# Patient Record
Sex: Male | Born: 1950 | Race: White | Hispanic: No | Marital: Married | Smoking: Never smoker
Health system: Southern US, Community
[De-identification: ages and names within clinical notes are randomized; demographics above are authoritative.]

## PROBLEM LIST (undated history)

## (undated) DIAGNOSIS — K828 Other specified diseases of gallbladder: Secondary | ICD-10-CM

## (undated) DIAGNOSIS — N179 Acute kidney failure, unspecified: Secondary | ICD-10-CM

## (undated) DIAGNOSIS — T4145XA Adverse effect of unspecified anesthetic, initial encounter: Secondary | ICD-10-CM

## (undated) DIAGNOSIS — C787 Secondary malignant neoplasm of liver and intrahepatic bile duct: Secondary | ICD-10-CM

## (undated) DIAGNOSIS — R1310 Dysphagia, unspecified: Secondary | ICD-10-CM

## (undated) DIAGNOSIS — I2510 Atherosclerotic heart disease of native coronary artery without angina pectoris: Secondary | ICD-10-CM

## (undated) DIAGNOSIS — T8859XA Other complications of anesthesia, initial encounter: Secondary | ICD-10-CM

## (undated) DIAGNOSIS — C189 Malignant neoplasm of colon, unspecified: Secondary | ICD-10-CM

## (undated) HISTORY — DX: Atherosclerotic heart disease of native coronary artery without angina pectoris: I25.10

---

## 2008-12-07 DIAGNOSIS — C189 Malignant neoplasm of colon, unspecified: Secondary | ICD-10-CM

## 2008-12-07 DIAGNOSIS — C787 Secondary malignant neoplasm of liver and intrahepatic bile duct: Secondary | ICD-10-CM

## 2008-12-07 HISTORY — DX: Secondary malignant neoplasm of liver and intrahepatic bile duct: C78.7

## 2008-12-07 HISTORY — PX: COLON SURGERY: SHX602

## 2008-12-07 HISTORY — DX: Malignant neoplasm of colon, unspecified: C18.9

## 2014-12-07 DIAGNOSIS — K828 Other specified diseases of gallbladder: Secondary | ICD-10-CM

## 2014-12-07 HISTORY — DX: Other specified diseases of gallbladder: K82.8

## 2015-02-05 DIAGNOSIS — R1310 Dysphagia, unspecified: Secondary | ICD-10-CM

## 2015-02-05 DIAGNOSIS — N179 Acute kidney failure, unspecified: Secondary | ICD-10-CM

## 2015-02-05 HISTORY — DX: Dysphagia, unspecified: R13.10

## 2015-02-05 HISTORY — DX: Acute kidney failure, unspecified: N17.9

## 2015-02-16 ENCOUNTER — Emergency Department (HOSPITAL_COMMUNITY): Payer: BLUE CROSS/BLUE SHIELD

## 2015-02-16 ENCOUNTER — Encounter (HOSPITAL_COMMUNITY): Payer: Self-pay

## 2015-02-16 ENCOUNTER — Inpatient Hospital Stay (HOSPITAL_COMMUNITY)
Admission: EM | Admit: 2015-02-16 | Discharge: 2015-02-27 | DRG: 682 | Disposition: A | Discharge status: Home Health | Payer: BLUE CROSS/BLUE SHIELD | Attending: Internal Medicine | Admitting: Internal Medicine

## 2015-02-16 DIAGNOSIS — R112 Nausea with vomiting, unspecified: Secondary | ICD-10-CM | POA: Diagnosis present

## 2015-02-16 DIAGNOSIS — C787 Secondary malignant neoplasm of liver and intrahepatic bile duct: Secondary | ICD-10-CM | POA: Diagnosis present

## 2015-02-16 DIAGNOSIS — Z79899 Other long term (current) drug therapy: Secondary | ICD-10-CM

## 2015-02-16 DIAGNOSIS — E440 Moderate protein-calorie malnutrition: Secondary | ICD-10-CM | POA: Diagnosis present

## 2015-02-16 DIAGNOSIS — R54 Age-related physical debility: Secondary | ICD-10-CM | POA: Diagnosis present

## 2015-02-16 DIAGNOSIS — E872 Acidosis: Secondary | ICD-10-CM | POA: Diagnosis present

## 2015-02-16 DIAGNOSIS — J9690 Respiratory failure, unspecified, unspecified whether with hypoxia or hypercapnia: Secondary | ICD-10-CM

## 2015-02-16 DIAGNOSIS — E871 Hypo-osmolality and hyponatremia: Secondary | ICD-10-CM | POA: Diagnosis present

## 2015-02-16 DIAGNOSIS — T451X5A Adverse effect of antineoplastic and immunosuppressive drugs, initial encounter: Secondary | ICD-10-CM | POA: Diagnosis present

## 2015-02-16 DIAGNOSIS — R1110 Vomiting, unspecified: Secondary | ICD-10-CM | POA: Insufficient documentation

## 2015-02-16 DIAGNOSIS — R64 Cachexia: Secondary | ICD-10-CM | POA: Diagnosis present

## 2015-02-16 DIAGNOSIS — N179 Acute kidney failure, unspecified: Secondary | ICD-10-CM | POA: Diagnosis not present

## 2015-02-16 DIAGNOSIS — I480 Paroxysmal atrial fibrillation: Secondary | ICD-10-CM | POA: Diagnosis present

## 2015-02-16 DIAGNOSIS — C7800 Secondary malignant neoplasm of unspecified lung: Secondary | ICD-10-CM | POA: Diagnosis present

## 2015-02-16 DIAGNOSIS — D899 Disorder involving the immune mechanism, unspecified: Secondary | ICD-10-CM | POA: Diagnosis present

## 2015-02-16 DIAGNOSIS — J9601 Acute respiratory failure with hypoxia: Secondary | ICD-10-CM | POA: Diagnosis not present

## 2015-02-16 DIAGNOSIS — D696 Thrombocytopenia, unspecified: Secondary | ICD-10-CM | POA: Diagnosis present

## 2015-02-16 DIAGNOSIS — C189 Malignant neoplasm of colon, unspecified: Secondary | ICD-10-CM | POA: Diagnosis present

## 2015-02-16 DIAGNOSIS — J690 Pneumonitis due to inhalation of food and vomit: Secondary | ICD-10-CM | POA: Diagnosis not present

## 2015-02-16 DIAGNOSIS — I4891 Unspecified atrial fibrillation: Secondary | ICD-10-CM

## 2015-02-16 DIAGNOSIS — F419 Anxiety disorder, unspecified: Secondary | ICD-10-CM | POA: Diagnosis present

## 2015-02-16 DIAGNOSIS — Z9221 Personal history of antineoplastic chemotherapy: Secondary | ICD-10-CM

## 2015-02-16 DIAGNOSIS — Z6827 Body mass index (BMI) 27.0-27.9, adult: Secondary | ICD-10-CM

## 2015-02-16 DIAGNOSIS — E876 Hypokalemia: Secondary | ICD-10-CM | POA: Diagnosis present

## 2015-02-16 DIAGNOSIS — R109 Unspecified abdominal pain: Secondary | ICD-10-CM

## 2015-02-16 DIAGNOSIS — C786 Secondary malignant neoplasm of retroperitoneum and peritoneum: Secondary | ICD-10-CM | POA: Diagnosis present

## 2015-02-16 DIAGNOSIS — J9600 Acute respiratory failure, unspecified whether with hypoxia or hypercapnia: Secondary | ICD-10-CM

## 2015-02-16 DIAGNOSIS — R0602 Shortness of breath: Secondary | ICD-10-CM

## 2015-02-16 DIAGNOSIS — B9620 Unspecified Escherichia coli [E. coli] as the cause of diseases classified elsewhere: Secondary | ICD-10-CM | POA: Diagnosis present

## 2015-02-16 DIAGNOSIS — E861 Hypovolemia: Secondary | ICD-10-CM | POA: Diagnosis present

## 2015-02-16 DIAGNOSIS — R34 Anuria and oliguria: Secondary | ICD-10-CM | POA: Diagnosis not present

## 2015-02-16 DIAGNOSIS — T17908A Unspecified foreign body in respiratory tract, part unspecified causing other injury, initial encounter: Secondary | ICD-10-CM | POA: Diagnosis not present

## 2015-02-16 DIAGNOSIS — K529 Noninfective gastroenteritis and colitis, unspecified: Secondary | ICD-10-CM

## 2015-02-16 DIAGNOSIS — R1310 Dysphagia, unspecified: Secondary | ICD-10-CM

## 2015-02-16 DIAGNOSIS — D649 Anemia, unspecified: Secondary | ICD-10-CM | POA: Diagnosis present

## 2015-02-16 DIAGNOSIS — K567 Ileus, unspecified: Secondary | ICD-10-CM | POA: Diagnosis present

## 2015-02-16 DIAGNOSIS — R0902 Hypoxemia: Secondary | ICD-10-CM

## 2015-02-16 DIAGNOSIS — E878 Other disorders of electrolyte and fluid balance, not elsewhere classified: Secondary | ICD-10-CM | POA: Diagnosis present

## 2015-02-16 DIAGNOSIS — Z9114 Patient's other noncompliance with medication regimen: Secondary | ICD-10-CM | POA: Diagnosis present

## 2015-02-16 DIAGNOSIS — Z4659 Encounter for fitting and adjustment of other gastrointestinal appliance and device: Secondary | ICD-10-CM

## 2015-02-16 LAB — I-STAT CHEM 8, ED
BUN: 46 mg/dL — AB (ref 6–23)
Calcium, Ion: 1.08 mmol/L — ABNORMAL LOW (ref 1.13–1.30)
Chloride: 90 mmol/L — ABNORMAL LOW (ref 96–112)
Creatinine, Ser: 1.5 mg/dL — ABNORMAL HIGH (ref 0.50–1.35)
Glucose, Bld: 180 mg/dL — ABNORMAL HIGH (ref 70–99)
HCT: 55 % — ABNORMAL HIGH (ref 39.0–52.0)
Hemoglobin: 18.7 g/dL — ABNORMAL HIGH (ref 13.0–17.0)
Potassium: 3.1 mmol/L — ABNORMAL LOW (ref 3.5–5.1)
SODIUM: 125 mmol/L — AB (ref 135–145)
TCO2: 18 mmol/L (ref 0–100)

## 2015-02-16 LAB — URINE MICROSCOPIC-ADD ON

## 2015-02-16 LAB — COMPREHENSIVE METABOLIC PANEL
ALK PHOS: 70 U/L (ref 39–117)
ALT: 22 U/L (ref 0–53)
AST: 31 U/L (ref 0–37)
Albumin: 3.2 g/dL — ABNORMAL LOW (ref 3.5–5.2)
Anion gap: 17 — ABNORMAL HIGH (ref 5–15)
BILIRUBIN TOTAL: 1.4 mg/dL — AB (ref 0.3–1.2)
BUN: 47 mg/dL — ABNORMAL HIGH (ref 6–23)
CHLORIDE: 88 mmol/L — AB (ref 96–112)
CO2: 22 mmol/L (ref 19–32)
CREATININE: 1.64 mg/dL — AB (ref 0.50–1.35)
Calcium: 9.2 mg/dL (ref 8.4–10.5)
GFR calc Af Amer: 51 mL/min — ABNORMAL LOW (ref >90)
GFR, EST NON AFRICAN AMERICAN: 44 mL/min — AB (ref >90)
GLUCOSE: 180 mg/dL — AB (ref 70–99)
POTASSIUM: 3.3 mmol/L — AB (ref 3.5–5.1)
Sodium: 127 mmol/L — ABNORMAL LOW (ref 135–145)
Total Protein: 6.8 g/dL (ref 6.0–8.3)

## 2015-02-16 LAB — CBC WITH DIFFERENTIAL/PLATELET
BASOS ABS: 0 10*3/uL (ref 0.0–0.1)
Basophils Relative: 0 % (ref 0–1)
Eosinophils Absolute: 0 10*3/uL (ref 0.0–0.7)
Eosinophils Relative: 0 % (ref 0–5)
HCT: 47.5 % (ref 39.0–52.0)
Hemoglobin: 18.2 g/dL — ABNORMAL HIGH (ref 13.0–17.0)
LYMPHS PCT: 16 % (ref 12–46)
Lymphs Abs: 0.9 10*3/uL (ref 0.7–4.0)
MCH: 30 pg (ref 26.0–34.0)
MCHC: 38.3 g/dL — AB (ref 30.0–36.0)
MCV: 78.4 fL (ref 78.0–100.0)
Monocytes Absolute: 2.6 10*3/uL — ABNORMAL HIGH (ref 0.1–1.0)
Monocytes Relative: 46 % — ABNORMAL HIGH (ref 3–12)
NEUTROS ABS: 2.1 10*3/uL (ref 1.7–7.7)
Neutrophils Relative %: 38 % — ABNORMAL LOW (ref 43–77)
PLATELETS: 185 10*3/uL (ref 150–400)
RBC: 6.06 MIL/uL — ABNORMAL HIGH (ref 4.22–5.81)
RDW: 16.7 % — ABNORMAL HIGH (ref 11.5–15.5)
WBC: 5.6 10*3/uL (ref 4.0–10.5)

## 2015-02-16 LAB — URINALYSIS, ROUTINE W REFLEX MICROSCOPIC
Bilirubin Urine: NEGATIVE
GLUCOSE, UA: 100 mg/dL — AB
Ketones, ur: NEGATIVE mg/dL
LEUKOCYTES UA: NEGATIVE
NITRITE: NEGATIVE
PH: 6 (ref 5.0–8.0)
PROTEIN: 30 mg/dL — AB
Specific Gravity, Urine: 1.028 (ref 1.005–1.030)
UROBILINOGEN UA: 0.2 mg/dL (ref 0.0–1.0)

## 2015-02-16 LAB — MAGNESIUM: Magnesium: 2.4 mg/dL (ref 1.5–2.5)

## 2015-02-16 LAB — LACTIC ACID, PLASMA: Lactic Acid, Venous: 3.6 mmol/L (ref 0.5–2.0)

## 2015-02-16 MED ORDER — SODIUM CHLORIDE 0.9 % IJ SOLN
3.0000 mL | Freq: Two times a day (BID) | INTRAMUSCULAR | Status: DC
  Administered 2015-02-17 – 2015-02-27 (×12): 3 mL via INTRAVENOUS

## 2015-02-16 MED ORDER — ALTEPLASE 2 MG IJ SOLR
2.0000 mg | Freq: Once | INTRAMUSCULAR | Status: AC
  Administered 2015-02-16: 2 mg
  Filled 2015-02-16: qty 2

## 2015-02-16 MED ORDER — ONDANSETRON HCL 4 MG PO TABS: 4.0000 mg | ORAL_TABLET | Freq: Four times a day (QID) | ORAL | Status: DC | PRN

## 2015-02-16 MED ORDER — SODIUM CHLORIDE 0.9 % IV BOLUS (SEPSIS)
1000.0000 mL | Freq: Once | INTRAVENOUS | Status: AC
  Administered 2015-02-16: 1000 mL via INTRAVENOUS

## 2015-02-16 MED ORDER — ACETAMINOPHEN 650 MG RE SUPP: 650.0000 mg | Freq: Four times a day (QID) | RECTAL | Status: DC | PRN

## 2015-02-16 MED ORDER — POTASSIUM CHLORIDE IN NACL 40-0.9 MEQ/L-% IV SOLN
INTRAVENOUS | Status: DC
Start: 2015-02-16 — End: 2015-02-18
  Administered 2015-02-16 – 2015-02-18 (×4): 125 mL/h via INTRAVENOUS
  Filled 2015-02-16 (×9): qty 1000

## 2015-02-16 MED ORDER — HYDROCODONE-ACETAMINOPHEN 5-325 MG PO TABS
1.0000 | ORAL_TABLET | ORAL | Status: DC | PRN
  Administered 2015-02-17: 1 via ORAL
  Filled 2015-02-16: qty 1

## 2015-02-16 MED ORDER — HEPARIN SODIUM (PORCINE) 5000 UNIT/ML IJ SOLN
5000.0000 [IU] | Freq: Three times a day (TID) | INTRAMUSCULAR | Status: DC
  Administered 2015-02-16 – 2015-02-19 (×8): 5000 [IU] via SUBCUTANEOUS
  Filled 2015-02-16 (×10): qty 1

## 2015-02-16 MED ORDER — ONDANSETRON HCL 4 MG/2ML IJ SOLN
4.0000 mg | Freq: Once | INTRAMUSCULAR | Status: AC
Start: 2015-02-16 — End: 2015-02-16
  Administered 2015-02-16: 4 mg via INTRAVENOUS
  Filled 2015-02-16: qty 2

## 2015-02-16 MED ORDER — PROCHLORPERAZINE MALEATE 10 MG PO TABS
10.0000 mg | ORAL_TABLET | Freq: Four times a day (QID) | ORAL | Status: DC | PRN
  Administered 2015-02-17: 10 mg via ORAL
  Filled 2015-02-16 (×2): qty 1

## 2015-02-16 MED ORDER — PHENOL 1.4 % MT LIQD
1.0000 | OROMUCOSAL | Status: DC | PRN
  Filled 2015-02-16: qty 177

## 2015-02-16 MED ORDER — POTASSIUM CHLORIDE 10 MEQ/100ML IV SOLN
10.0000 meq | INTRAVENOUS | Status: AC
  Administered 2015-02-16 (×4): 10 meq via INTRAVENOUS
  Filled 2015-02-16 (×4): qty 100

## 2015-02-16 MED ORDER — HYDROMORPHONE HCL 1 MG/ML IJ SOLN
0.5000 mg | INTRAMUSCULAR | Status: DC | PRN
  Administered 2015-02-16 – 2015-02-17 (×3): 0.5 mg via INTRAVENOUS
  Filled 2015-02-16 (×4): qty 1

## 2015-02-16 MED ORDER — PROMETHAZINE HCL 25 MG/ML IJ SOLN
25.0000 mg | Freq: Once | INTRAMUSCULAR | Status: AC
  Administered 2015-02-16: 25 mg via INTRAVENOUS
  Filled 2015-02-16: qty 1

## 2015-02-16 MED ORDER — ONDANSETRON HCL 4 MG/2ML IJ SOLN
4.0000 mg | Freq: Four times a day (QID) | INTRAMUSCULAR | Status: DC | PRN
  Administered 2015-02-16 – 2015-02-18 (×6): 4 mg via INTRAVENOUS
  Filled 2015-02-16 (×6): qty 2

## 2015-02-16 MED ORDER — SODIUM CHLORIDE 0.9 % IJ SOLN
10.0000 mL | INTRAMUSCULAR | Status: DC | PRN
  Administered 2015-02-20: 10 mL
  Administered 2015-02-22: 20 mL
  Administered 2015-02-23: 10 mL
  Administered 2015-02-23: 20 mL
  Administered 2015-02-23 – 2015-02-24 (×3): 10 mL
  Administered 2015-02-25: 20 mL
  Filled 2015-02-16 (×8): qty 40

## 2015-02-16 MED ORDER — ALUM & MAG HYDROXIDE-SIMETH 200-200-20 MG/5ML PO SUSP
30.0000 mL | Freq: Four times a day (QID) | ORAL | Status: DC | PRN
  Filled 2015-02-16: qty 30

## 2015-02-16 MED ORDER — HYDROMORPHONE HCL 1 MG/ML IJ SOLN
1.0000 mg | INTRAMUSCULAR | Status: DC | PRN
  Administered 2015-02-16: 1 mg via INTRAVENOUS
  Filled 2015-02-16: qty 1

## 2015-02-16 MED ORDER — ACETAMINOPHEN 325 MG PO TABS: 650.0000 mg | ORAL_TABLET | Freq: Four times a day (QID) | ORAL | Status: DC | PRN

## 2015-02-16 NOTE — ED Provider Notes (Signed)
CSN: [id number]     Arrival date & time 02/16/15  0754 History   First MD Initiated Contact with Patient 02/16/15 0813     No chief complaint on file.    (Consider location/radiation/quality/duration/timing/severity/associated sxs/prior Treatment) HPI Comments: Pt has hx of colon CA with meds to abdomen - on chemo IV and PO - just finished second round of oral chemo 4 days ago - has been having diarrhea, persistent n/v and not improving with meds at home.  Sx are gradually worsening, nothing makes better or worse.  Denies fevers.  Had dark stools 2 days ago.   Gets care at [facility] records reviewed, right hemicolectomy with primary anastomosis was the surgery that was performed in December 2015.  The history is provided by the patient.    Past Medical History  Diagnosis Date  . Cancer    Past Surgical History  Procedure Laterality Date  . Colon surgery     History reviewed. No pertinent family history. History  Substance Use Topics  . Smoking status: Never Smoker   . Smokeless tobacco: Not on file  . Alcohol Use: Yes     Comment: Social drinker    Review of Systems  All other systems reviewed and are negative.     Allergies  Review of patient's allergies indicates no known allergies.  Home Medications   Prior to Admission medications   Medication Sig Start Date End Date Taking? Authorizing Provider  capecitabine (XELODA) 500 MG tablet Take 1,500 mg by mouth See admin instructions. Take 3 tab (1500 mg) in AM and 3 tab (1500 mg) in PM (12 hours apart) by mouth with water AFTER meal days 1-14. OFF days 15-21 12/26/14  Yes Historical Provider, MD  Cetirizine HCl 10 MG CAPS Take 10 mg by mouth daily.   Yes Historical Provider, MD  ondansetron (ZOFRAN) 8 MG tablet Take by mouth every 8 (eight) hours as needed for nausea or vomiting.   Yes Historical Provider, MD  prochlorperazine (COMPAZINE) 10 MG tablet Take 10 mg by mouth every 6 (six) hours as needed for nausea or  vomiting.   Yes Historical Provider, MD  rosuvastatin (CRESTOR) 10 MG tablet Take 10 mg by mouth daily. 09/08/14  Yes Historical Provider, MD  senna-docusate (SENOKOT-S) 8.6-50 MG per tablet Take 1 tablet by mouth daily as needed. 01/24/15 01/24/16 Yes Historical Provider, MD   BP 129/88 mmHg  Pulse 115  Temp(Src) 97.5 F (36.4 C) (Oral)  Resp 18  Ht 5\' 8"  (1.727 m)  Wt 160 lb (72.576 kg)  BMI 24.33 kg/m2  SpO2 93% Physical Exam  Constitutional: He appears well-developed and well-nourished.  HENT:  Head: Normocephalic and atraumatic.  Mouth/Throat: Oropharynx is clear and moist. No oropharyngeal exudate.  Dry MM  Eyes: Conjunctivae and EOM are normal. Pupils are equal, round, and reactive to light. Right eye exhibits no discharge. Left eye exhibits no discharge. No scleral icterus.  Neck: Normal range of motion. Neck supple. No JVD present. No thyromegaly present.  Cardiovascular: Regular rhythm, normal heart sounds and intact distal pulses.  Exam reveals no gallop and no friction rub.   No murmur heard. Tachycardia   Pulmonary/Chest: Effort normal and breath sounds normal. No respiratory distress. He has no wheezes. He has no rales.  Abdominal: Soft. Bowel sounds are normal. He exhibits no distension and no mass. There is tenderness ( Laparotomy scar well-healed, tenderness in the right side of the abdomen with mild guarding, no distention, no tympanitic sounds to  percussion).  Musculoskeletal: Normal range of motion. He exhibits no edema or tenderness.  Lymphadenopathy:    He has no cervical adenopathy.  Neurological: He is alert. Coordination normal.  Skin: Skin is warm and dry. No rash noted. No erythema.  Psychiatric: He has a normal mood and affect. His behavior is normal.  Nursing note and vitals reviewed.   ED Course  Procedures (including critical care time) Labs Review Labs Reviewed  COMPREHENSIVE METABOLIC PANEL - Abnormal; Notable for the following:    Sodium 127 (*)     Potassium 3.3 (*)    Chloride 88 (*)    Glucose, Bld 180 (*)    BUN 47 (*)    Creatinine, Ser 1.64 (*)    Albumin 3.2 (*)    Total Bilirubin 1.4 (*)    GFR calc non Af Amer 44 (*)    GFR calc Af Amer 51 (*)    Anion gap 17 (*)    All other components within normal limits  CBC WITH DIFFERENTIAL/PLATELET - Abnormal; Notable for the following:    RBC 6.06 (*)    Hemoglobin 18.2 (*)    MCHC 38.3 (*)    RDW 16.7 (*)    Neutrophils Relative % 38 (*)    Monocytes Relative 46 (*)    Monocytes Absolute 2.6 (*)    All other components within normal limits  I-STAT CHEM 8, ED - Abnormal; Notable for the following:    Sodium 125 (*)    Potassium 3.1 (*)    Chloride 90 (*)    BUN 46 (*)    Creatinine, Ser 1.50 (*)    Glucose, Bld 180 (*)    Calcium, Ion 1.08 (*)    Hemoglobin 18.7 (*)    HCT 55.0 (*)    All other components within normal limits  URINALYSIS, ROUTINE W REFLEX MICROSCOPIC  POC OCCULT BLOOD, ED    Imaging Review US Abdomen Complete  02/16/2015   CLINICAL DATA:  Abdominal pain, colon carcinoma, nausea and vomiting  EXAM: COMPLETE ABDOMINAL ULTRASOUND  COMPARISON:  None available  FINDINGS: Gallbladder: Physiologically distended without stones, wall thickening, or pericholecystic fluid. Non shadowing mobile tumefactive sludge. Sonographer reports no sonographic Murphy's sign.  Common bile duct:  Normal in caliber, 4.38mm diameter.  Liver: There are 2 echogenic lesions in the right lobe probably in segment 5, 28 x 14 x 18 mm and 16 x 13 x 16 mm. There is no intrahepatic biliary ductal dilatation. Background parenchyma is unremarkable.  IVC:  Negative  Pancreas: Visualized segments unremarkable, portions obscured by overlying bowel gas.  Spleen:  No focal lesion, craniocaudal 7.5cm in length.  Right Kidney: 18 x 16 x 15 mm cyst in the upper pole. No hydronephrosis. 9.7Cm in length.  Left Kidney:  No lesion or hydronephrosis, 11.2cm in length.  Abdominal aorta:  Negative   IMPRESSION: 1. Two focal liver lesions suggesting metastatic disease given the clinical history. 2. Mobile tumefactive sludge in the gallbladder without ultrasound evidence of cholecystitis. 3. Right renal cyst   Electronically Signed   By: Lucrezia Europe M.D.   On: 02/16/2015 12:03   Dg Abd Acute W/chest  02/16/2015   CLINICAL DATA:  Dehydration, decreased appetite, nausea, vomiting and diarrhea for 4 days, cancer patient with port for chemotherapy  EXAM: ACUTE ABDOMEN SERIES (ABDOMEN 2 VIEW & CHEST 1 VIEW)  COMPARISON:  None  FINDINGS: RIGHT jugular Port-A-Cath tip projecting over SVC near cavoatrial junction.  Normal heart size, mediastinal contours and  pulmonary vascularity.  LEFT hilar versus perihilar mass 3.0 x 2.7 cm.  Lungs otherwise clear.  No infiltrate, pleural effusion or pneumothorax.  Prior bowel resection RIGHT upper quadrant question RIGHT hemicolectomy.  Normal bowel gas pattern.  No bowel dilatation, bowel wall thickening, or free intraperitoneal air.  LEFT pelvic phleboliths.  No definite urinary tract calcification or acute osseous findings.  IMPRESSION: 3.0 x 2.7 cm LEFT hilar versus perihilar mass question neoplasm; recommend correlation with any prior outside imaging patient has.  If chest has not been imaged previously, recommend assessment by CT chest with IV contrast to exclude primary or metastatic neoplasm as well as hilar adenopathy.  Question prior RIGHT colon resection.  No acute abdominal findings.   Electronically Signed   By: Lavonia Dana M.D.   On: 02/16/2015 10:22      MDM   Final diagnoses:  SOB (shortness of breath)  Vomiting  Abdominal pain  Hyponatremia  Oliguria  AKI (acute kidney injury)    Ill appearing, tachycardic and dehydrated - has not been taking meds as prescribed to prophylactically treat his nausea.  The patient will need acute abdominal series, labs, hydration, anticipate admission. Possibly has a bowel obstruction secondary to recent  surgery  Review of the medical record shows that the patient had had CT scan and ultrasound imaging approximately 4 weeks ago at [facility], these images were reviewed, CT scan showed no acute findings, ultrasound showed findings consistent with cholecystitis, the patient did not have surgery at that time thought to be contraindicated secondary to patient's cancer status on chemotherapy.  BUN:Cr ratio is >30, so this is likely related to dehydration - though chemo could be causing the renal failure as well.  Hydration given.  Discussed with hospitalist, he will be admitted to that service, he is persistently mildly tachycardic, no coughing, no fever, blood counts reassuring, no signs of neutropenia. IV fluids and supportive antiemetics have been given. Hyponatremia diagnosed, again supportive care, acute kidney injury, again supportive care.  Meds given in ED:  Medications  sodium chloride 0.9 % injection 10-40 mL (not administered)  sodium chloride 0.9 % bolus 1,000 mL (0 mLs Intravenous Stopped 02/16/15 1145)  ondansetron (ZOFRAN) injection 4 mg (4 mg Intravenous Given 02/16/15 1054)  promethazine (PHENERGAN) injection 25 mg (25 mg Intravenous Given 02/16/15 1051)  alteplase (CATHFLO ACTIVASE) injection 2 mg (2 mg Intracatheter Given 02/16/15 1319)      Noemi Chapel, MD 02/16/15 1337

## 2015-02-16 NOTE — ED Notes (Signed)
Pt is cancer pt and reports that he is currently on chemo along with oral chemo.  Pt reports that since Wednesday he has been unable to keep any food or fluids down and just feels drained and tired.  Pt has a prescription for anti-nausea medications but reports no relief.  Pt has porta cath in right upper chest.

## 2015-02-16 NOTE — ED Notes (Signed)
Pt asking for water.  Pt advised not at this time d/t chief complaint of vomiting.

## 2015-02-16 NOTE — H&P (Signed)
[facility] History and Physical                                                                                    Benjamin Blake, is a 64 y.o. male  MRN: [id number]   DOB - 07/20/1951  Admit Date - 02/16/2015  Outpatient Primary MD for the patient is Default, Provider, MD  With History of -  Past Medical History  Diagnosis Date  . Cancer       Past Surgical History  Procedure Laterality Date  . Colon surgery      in for   No chief complaint on file.    HPI  Benjamin Blake  is a 64 y.o. male, who just moved to the area from [place]. He has metastatic colon cancer and presents to our ER for intractable vomiting and diarrhea. His colon cancer was originally diagnosed in 2010. He underwent surgery at that time. Unfortunately they found a recurrence in 2015 with metastases to the liver, peritoneum, and lung. He is undergoing chemotherapy at [facility] His oncologist is Dr. Reynaldo Minium. His last chemotherapy treatment was 01/31/15. He was feeling fairly well until Thursday 3/10. After dinner on Thursday he began having vomiting and diarrhea. His wife called the oncologist who recommended pushing fluids including Pedialyte. They attempted to do this, but he became progressively weaker and kept vomiting. Consequently they came to the emergency department. He has seen some black emesis yesterday but has continued to vomit since and has seen no more hematemesis.  In the ER he appears dehydrated. He is hemoconcentrated with a hemoglobin of 18.7 and hematocrit of 55. His creatinine is elevated at 1.64. Baseline is 1.0. His potassium chloride and sodium are low.  Review of Systems   In addition to the HPI above,  No Fever-chills, No Headache, No changes with Vision or hearing, No problems swallowing food or Liquids, No Chest pain, Cough or Shortness of Breath, No Abdominal pain, No Nausea or Vomiting, Bowel movements are regular, No Blood in stool or Urine, No dysuria, No new skin rashes  or bruises, No new joints pains-aches,  No new weakness, tingling, numbness in any extremity, No recent weight gain or loss, A full 10 point Review of Systems was done, except as stated above, all other Review of Systems were negative.  Social History History  Substance Use Topics  . Smoking status: Never Smoker   . Smokeless tobacco: Not on file  . Alcohol Use: Yes     Comment: Social drinker    Family History History reviewed. No pertinent family history.  Prior to Admission medications   Medication Sig Start Date End Date Taking? Authorizing Provider  capecitabine (XELODA) 500 MG tablet Take 1,500 mg by mouth See admin instructions. Take 3 tab (1500 mg) in AM and 3 tab (1500 mg) in PM (12 hours apart) by mouth with water AFTER meal days 1-14. OFF days 15-21 12/26/14  Yes Historical Provider, MD  Cetirizine HCl 10 MG CAPS Take 10 mg by mouth daily.   Yes Historical Provider, MD  ondansetron (ZOFRAN) 8 MG tablet Take by mouth every 8 (eight) hours as needed for nausea or vomiting.  Yes Historical Provider, MD  prochlorperazine (COMPAZINE) 10 MG tablet Take 10 mg by mouth every 6 (six) hours as needed for nausea or vomiting.   Yes Historical Provider, MD  rosuvastatin (CRESTOR) 10 MG tablet Take 10 mg by mouth daily. 09/08/14  Yes Historical Provider, MD  senna-docusate (SENOKOT-S) 8.6-50 MG per tablet Take 1 tablet by mouth daily as needed. 01/24/15 01/24/16 Yes Historical Provider, MD    No Known Allergies  Physical Exam  Vitals  Blood pressure 135/81, pulse 103, temperature 97.5 F (36.4 C), temperature source Oral, resp. rate 18, height 5' 8"  (1.727 m), weight 72.576 kg (160 lb), SpO2 95 %.   General:  Wd caucasian male, lying in bed in NAD, wife at bedside.  Psych:  Normal affect and insight, Not Suicidal or Homicidal, Awake Alert, Oriented X 3.  Neuro:   No F.N deficits, ALL C.Nerves Intact, Strength 5/5 all 4 extremities, Sensation intact all 4 extremities.  ENT:  Ears  and Eyes appear Normal, Conjunctivae clear, PER. Moist oral mucosa without erythema or exudates.  Neck:  Supple, No lymphadenopathy appreciated  Respiratory:  Symmetrical chest wall movement, Good air movement bilaterally, CTAB.  Port a cath in right chest.   Cardiac:  Tachy with No Murmurs, no LE edema noted, no JVD.    Abdomen:  Positive bowel sounds, Soft, mild tenderness in the periumbilical region, Non distended,  No masses appreciated  Skin:  No Cyanosis, Normal Skin Turgor, No Skin Rash or Bruise.  Extremities:  Able to move all 4. 5/5 strength in each,  no effusions.  Data Review  CBC  Recent Labs Lab 02/16/15 1040 02/16/15 1051  WBC 5.6  --   HGB 18.2* 18.7*  HCT 47.5 55.0*  PLT 185  --   MCV 78.4  --   MCH 30.0  --   MCHC 38.3*  --   RDW 16.7*  --   LYMPHSABS 0.9  --   MONOABS 2.6*  --   EOSABS 0.0  --   BASOSABS 0.0  --     Chemistries   Recent Labs Lab 02/16/15 1040 02/16/15 1051  NA 127* 125*  K 3.3* 3.1*  CL 88* 90*  CO2 22  --   GLUCOSE 180* 180*  BUN 47* 46*  CREATININE 1.64* 1.50*  CALCIUM 9.2  --   AST 31  --   ALT 22  --   ALKPHOS 70  --   BILITOT 1.4*  --      Imaging results:   US Abdomen Complete  02/16/2015   CLINICAL DATA:  Abdominal pain, colon carcinoma, nausea and vomiting  EXAM: COMPLETE ABDOMINAL ULTRASOUND  COMPARISON:  None available  FINDINGS: Gallbladder: Physiologically distended without stones, wall thickening, or pericholecystic fluid. Non shadowing mobile tumefactive sludge. Sonographer reports no sonographic Murphy's sign.  Common bile duct:  Normal in caliber, 4.70m diameter.  Liver: There are 2 echogenic lesions in the right lobe probably in segment 5, 28 x 14 x 18 mm and 16 x 13 x 16 mm. There is no intrahepatic biliary ductal dilatation. Background parenchyma is unremarkable.  IVC:  Negative  Pancreas: Visualized segments unremarkable, portions obscured by overlying bowel gas.  Spleen:  No focal lesion, craniocaudal  7.5cm in length.  Right Kidney: 18 x 16 x 15 mm cyst in the upper pole. No hydronephrosis. 9.7Cm in length.  Left Kidney:  No lesion or hydronephrosis, 11.2cm in length.  Abdominal aorta:  Negative  IMPRESSION: 1. Two focal liver lesions suggesting  metastatic disease given the clinical history. 2. Mobile tumefactive sludge in the gallbladder without ultrasound evidence of cholecystitis. 3. Right renal cyst   Electronically Signed   By: Lucrezia Europe M.D.   On: 02/16/2015 12:03   Dg Abd Acute W/chest  02/16/2015   CLINICAL DATA:  Dehydration, decreased appetite, nausea, vomiting and diarrhea for 4 days, cancer patient with port for chemotherapy  EXAM: ACUTE ABDOMEN SERIES (ABDOMEN 2 VIEW & CHEST 1 VIEW)  COMPARISON:  None  FINDINGS: RIGHT jugular Port-A-Cath tip projecting over SVC near cavoatrial junction.  Normal heart size, mediastinal contours and pulmonary vascularity.  LEFT hilar versus perihilar mass 3.0 x 2.7 cm.  Lungs otherwise clear.  No infiltrate, pleural effusion or pneumothorax.  Prior bowel resection RIGHT upper quadrant question RIGHT hemicolectomy.  Normal bowel gas pattern.  No bowel dilatation, bowel wall thickening, or free intraperitoneal air.  LEFT pelvic phleboliths.  No definite urinary tract calcification or acute osseous findings.  IMPRESSION: 3.0 x 2.7 cm LEFT hilar versus perihilar mass question neoplasm; recommend correlation with any prior outside imaging patient has.  If chest has not been imaged previously, recommend assessment by CT chest with IV contrast to exclude primary or metastatic neoplasm as well as hilar adenopathy.  Question prior RIGHT colon resection.  No acute abdominal findings.   Electronically Signed   By: Lavonia Dana M.D.   On: 02/16/2015 10:22     Assessment & Plan  Principal Problem:   Chemotherapy-induced vomiting Active Problems:   AKI (acute kidney injury)   Hypokalemia   Hyponatremia   Chemo Therapy Induced Vomiting. Patient last had chemo on  2/25 at [facility].  Started vomiting and having diarrhea Thursday night (3/10) after eating a low fat dinner. Will check for C-diff.  Lactic acid pending. Has had recent gall bladder evaluation for sludge.  Surgery at [facility] recommended Surveillance.   IV hydration.  Replenish potassium, check magnesium.  Acute Kidney Injury Baseline creatinine of 1.0 at [facility] on 2/25.   Secondary to vomiting and diarrhea.  U/A pending. Expect this will improve with IV hydration.    Elevated LFTs. LFTs at [facility] on 2/25 were AST:  29, ALT:  43,  Alk Phos:  84, Tbili 0.8. Today on admission:  AST:  31,  ALT:  22, Alk Phos:  70, Tbili 1.4 U/S 3/12 appears negative for cholecystitis.  No CBD stone.  Sludge in GB.  + liver lesions. The elevation is likely due to liver mets.  Hyponatremia / Hypokalemia / Hypochloremia Secondary to dehydration.  Will hydrate and check labs in am.  DVT Prophylaxis: heparin  AM Labs Ordered, also please review Full Orders  Family Communication:   Wife at bedside.  Code Status:  full  Condition:  Guarded but stable.  Time spent in minutes : [id number]  PA-C on 02/16/2015 at 2:26 PM  Between 7am to 7pm - Pager - [phone]  After 7pm go to [email or web address] - password [email or web address]  And look for the night coverage person covering me after hours  [facility]

## 2015-02-16 NOTE — ED Notes (Signed)
Pt knows that urine is needed. Pt is getting IV fluids at this time.

## 2015-02-16 NOTE — ED Notes (Signed)
Pt knows that urine is needed. Pt given a urinal pt knows that urine is needed. Pt stated that he would try to void.

## 2015-02-17 ENCOUNTER — Inpatient Hospital Stay (HOSPITAL_COMMUNITY): Payer: BLUE CROSS/BLUE SHIELD

## 2015-02-17 ENCOUNTER — Observation Stay (HOSPITAL_COMMUNITY): Payer: BLUE CROSS/BLUE SHIELD

## 2015-02-17 DIAGNOSIS — Z9114 Patient's other noncompliance with medication regimen: Secondary | ICD-10-CM | POA: Diagnosis present

## 2015-02-17 DIAGNOSIS — D899 Disorder involving the immune mechanism, unspecified: Secondary | ICD-10-CM | POA: Diagnosis present

## 2015-02-17 DIAGNOSIS — B9620 Unspecified Escherichia coli [E. coli] as the cause of diseases classified elsewhere: Secondary | ICD-10-CM | POA: Diagnosis present

## 2015-02-17 DIAGNOSIS — Z9221 Personal history of antineoplastic chemotherapy: Secondary | ICD-10-CM | POA: Diagnosis not present

## 2015-02-17 DIAGNOSIS — N179 Acute kidney failure, unspecified: Secondary | ICD-10-CM | POA: Diagnosis present

## 2015-02-17 DIAGNOSIS — T17998S Other foreign object in respiratory tract, part unspecified causing other injury, sequela: Secondary | ICD-10-CM | POA: Diagnosis not present

## 2015-02-17 DIAGNOSIS — J9601 Acute respiratory failure with hypoxia: Secondary | ICD-10-CM | POA: Diagnosis not present

## 2015-02-17 DIAGNOSIS — C7800 Secondary malignant neoplasm of unspecified lung: Secondary | ICD-10-CM | POA: Diagnosis present

## 2015-02-17 DIAGNOSIS — R54 Age-related physical debility: Secondary | ICD-10-CM | POA: Diagnosis present

## 2015-02-17 DIAGNOSIS — R64 Cachexia: Secondary | ICD-10-CM | POA: Diagnosis present

## 2015-02-17 DIAGNOSIS — J690 Pneumonitis due to inhalation of food and vomit: Secondary | ICD-10-CM | POA: Diagnosis not present

## 2015-02-17 DIAGNOSIS — R112 Nausea with vomiting, unspecified: Secondary | ICD-10-CM | POA: Diagnosis present

## 2015-02-17 DIAGNOSIS — D649 Anemia, unspecified: Secondary | ICD-10-CM | POA: Diagnosis present

## 2015-02-17 DIAGNOSIS — Z79899 Other long term (current) drug therapy: Secondary | ICD-10-CM | POA: Diagnosis not present

## 2015-02-17 DIAGNOSIS — D696 Thrombocytopenia, unspecified: Secondary | ICD-10-CM | POA: Diagnosis present

## 2015-02-17 DIAGNOSIS — E861 Hypovolemia: Secondary | ICD-10-CM | POA: Diagnosis present

## 2015-02-17 DIAGNOSIS — I480 Paroxysmal atrial fibrillation: Secondary | ICD-10-CM | POA: Diagnosis present

## 2015-02-17 DIAGNOSIS — E878 Other disorders of electrolyte and fluid balance, not elsewhere classified: Secondary | ICD-10-CM | POA: Diagnosis present

## 2015-02-17 DIAGNOSIS — K567 Ileus, unspecified: Secondary | ICD-10-CM | POA: Diagnosis present

## 2015-02-17 DIAGNOSIS — I4891 Unspecified atrial fibrillation: Secondary | ICD-10-CM | POA: Diagnosis not present

## 2015-02-17 DIAGNOSIS — T17998D Other foreign object in respiratory tract, part unspecified causing other injury, subsequent encounter: Secondary | ICD-10-CM | POA: Diagnosis not present

## 2015-02-17 DIAGNOSIS — T451X5A Adverse effect of antineoplastic and immunosuppressive drugs, initial encounter: Secondary | ICD-10-CM | POA: Diagnosis present

## 2015-02-17 DIAGNOSIS — J9600 Acute respiratory failure, unspecified whether with hypoxia or hypercapnia: Secondary | ICD-10-CM | POA: Diagnosis not present

## 2015-02-17 DIAGNOSIS — E876 Hypokalemia: Secondary | ICD-10-CM | POA: Diagnosis present

## 2015-02-17 DIAGNOSIS — E871 Hypo-osmolality and hyponatremia: Secondary | ICD-10-CM | POA: Diagnosis present

## 2015-02-17 DIAGNOSIS — C786 Secondary malignant neoplasm of retroperitoneum and peritoneum: Secondary | ICD-10-CM | POA: Diagnosis present

## 2015-02-17 DIAGNOSIS — C189 Malignant neoplasm of colon, unspecified: Secondary | ICD-10-CM | POA: Diagnosis present

## 2015-02-17 DIAGNOSIS — C787 Secondary malignant neoplasm of liver and intrahepatic bile duct: Secondary | ICD-10-CM | POA: Diagnosis present

## 2015-02-17 DIAGNOSIS — Z6827 Body mass index (BMI) 27.0-27.9, adult: Secondary | ICD-10-CM | POA: Diagnosis not present

## 2015-02-17 DIAGNOSIS — E46 Unspecified protein-calorie malnutrition: Secondary | ICD-10-CM | POA: Diagnosis not present

## 2015-02-17 DIAGNOSIS — E872 Acidosis: Secondary | ICD-10-CM | POA: Diagnosis present

## 2015-02-17 DIAGNOSIS — R1110 Vomiting, unspecified: Secondary | ICD-10-CM

## 2015-02-17 DIAGNOSIS — F419 Anxiety disorder, unspecified: Secondary | ICD-10-CM | POA: Diagnosis present

## 2015-02-17 DIAGNOSIS — E440 Moderate protein-calorie malnutrition: Secondary | ICD-10-CM | POA: Diagnosis present

## 2015-02-17 DIAGNOSIS — R34 Anuria and oliguria: Secondary | ICD-10-CM | POA: Diagnosis present

## 2015-02-17 LAB — BLOOD GAS, ARTERIAL
Acid-base deficit: 6.5 mmol/L — ABNORMAL HIGH (ref 0.0–2.0)
Bicarbonate: 17.3 mEq/L — ABNORMAL LOW (ref 20.0–24.0)
Drawn by: 252031
O2 CONTENT: 6 L/min
O2 SAT: 91.3 %
PATIENT TEMPERATURE: 98.6
PCO2 ART: 28.5 mmHg — AB (ref 35.0–45.0)
TCO2: 18.2 mmol/L (ref 0–100)
pH, Arterial: 7.402 (ref 7.350–7.450)
pO2, Arterial: 63.8 mmHg — ABNORMAL LOW (ref 80.0–100.0)

## 2015-02-17 LAB — CBC
HCT: 44.1 % (ref 39.0–52.0)
HCT: 41.4 % (ref 39.0–52.0)
Hemoglobin: 16 g/dL (ref 13.0–17.0)
Hemoglobin: 15 g/dL (ref 13.0–17.0)
MCH: 29 pg (ref 26.0–34.0)
MCH: 29.2 pg (ref 26.0–34.0)
MCHC: 36.3 g/dL — AB (ref 30.0–36.0)
MCHC: 36.2 g/dL — ABNORMAL HIGH (ref 30.0–36.0)
MCV: 80 fL (ref 78.0–100.0)
MCV: 80.7 fL (ref 78.0–100.0)
PLATELETS: 158 10*3/uL (ref 150–400)
PLATELETS: 153 10*3/uL (ref 150–400)
RBC: 5.51 MIL/uL (ref 4.22–5.81)
RBC: 5.13 MIL/uL (ref 4.22–5.81)
RDW: 16.8 % — ABNORMAL HIGH (ref 11.5–15.5)
RDW: 17.1 % — AB (ref 11.5–15.5)
WBC: 6.1 10*3/uL (ref 4.0–10.5)
WBC: 7.6 10*3/uL (ref 4.0–10.5)

## 2015-02-17 LAB — BASIC METABOLIC PANEL
ANION GAP: 13 (ref 5–15)
Anion gap: 11 (ref 5–15)
BUN: 23 mg/dL (ref 6–23)
BUN: 31 mg/dL — AB (ref 6–23)
CALCIUM: 8.2 mg/dL — AB (ref 8.4–10.5)
CHLORIDE: 95 mmol/L — AB (ref 96–112)
CO2: 21 mmol/L (ref 19–32)
CO2: 21 mmol/L (ref 19–32)
CREATININE: 1.27 mg/dL (ref 0.50–1.35)
Calcium: 8.2 mg/dL — ABNORMAL LOW (ref 8.4–10.5)
Chloride: 94 mmol/L — ABNORMAL LOW (ref 96–112)
Creatinine, Ser: 1 mg/dL (ref 0.50–1.35)
GFR calc Af Amer: 69 mL/min — ABNORMAL LOW (ref >90)
GFR calc non Af Amer: 79 mL/min — ABNORMAL LOW (ref >90)
GFR, EST NON AFRICAN AMERICAN: 59 mL/min — AB (ref >90)
GLUCOSE: 148 mg/dL — AB (ref 70–99)
GLUCOSE: 108 mg/dL — AB (ref 70–99)
POTASSIUM: 4.1 mmol/L (ref 3.5–5.1)
POTASSIUM: 5 mmol/L (ref 3.5–5.1)
Sodium: 127 mmol/L — ABNORMAL LOW (ref 135–145)
Sodium: 128 mmol/L — ABNORMAL LOW (ref 135–145)

## 2015-02-17 LAB — LACTIC ACID, PLASMA: LACTIC ACID, VENOUS: 4.7 mmol/L — AB (ref 0.5–2.0)

## 2015-02-17 LAB — BRAIN NATRIURETIC PEPTIDE: B NATRIURETIC PEPTIDE 5: 59.4 pg/mL (ref 0.0–100.0)

## 2015-02-17 LAB — CLOSTRIDIUM DIFFICILE BY PCR: CDIFFPCR: NEGATIVE

## 2015-02-17 MED ORDER — HYDROMORPHONE HCL 1 MG/ML IJ SOLN
1.0000 mg | INTRAMUSCULAR | Status: DC | PRN
  Administered 2015-02-17 – 2015-02-18 (×9): 1 mg via INTRAVENOUS
  Filled 2015-02-17 (×8): qty 1

## 2015-02-17 MED ORDER — HYDROMORPHONE HCL 1 MG/ML IJ SOLN
1.0000 mg | Freq: Once | INTRAMUSCULAR | Status: AC
  Administered 2015-02-17: 1 mg via INTRAVENOUS

## 2015-02-17 MED ORDER — METOPROLOL TARTRATE 1 MG/ML IV SOLN: 5.0000 mg | Freq: Once | INTRAVENOUS | Status: DC

## 2015-02-17 MED ORDER — BACLOFEN 5 MG HALF TABLET
5.0000 mg | ORAL_TABLET | Freq: Three times a day (TID) | ORAL | Status: DC | PRN
  Administered 2015-02-17 (×2): 5 mg via ORAL
  Filled 2015-02-17 (×4): qty 1

## 2015-02-17 MED ORDER — WHITE PETROLATUM GEL
Status: AC
  Administered 2015-02-17: 13:00:00
  Filled 2015-02-17: qty 1

## 2015-02-17 MED ORDER — HYDROMORPHONE HCL 1 MG/ML IJ SOLN
INTRAMUSCULAR | Status: AC
  Filled 2015-02-17: qty 1

## 2015-02-17 MED ORDER — METOPROLOL TARTRATE 1 MG/ML IV SOLN
2.5000 mg | Freq: Once | INTRAVENOUS | Status: AC
  Administered 2015-02-17: 2.5 mg via INTRAVENOUS
  Filled 2015-02-17: qty 5

## 2015-02-17 NOTE — Progress Notes (Signed)
UR completed 

## 2015-02-17 NOTE — Evaluation (Signed)
Physical Therapy Evaluation Patient Details Name: Benjamin Blake MRN: [id number] DOB: 24-Apr-1951 Today's Date: 02/17/2015   History of Present Illness  Pt is a 64 y.o. male, who just moved to the area from [place]. He has metastatic colon cancer and presents to our ER for intractable vomiting and diarrhea. His colon cancer was originally diagnosed in 2010. Unfortunately they found a recurrence in 2015 with metastases to the liver, peritoneum, and lung. He is undergoing chemotherapy at [facility]. His oncologist is Dr. Reynaldo Minium. His last chemotherapy treatment was 01/31/15. He was feeling fairly well until Thursday 3/10. After dinner on Thursday he began having vomiting and diarrhea. His wife called the oncologist who recommended pushing fluids including Pedialyte. They attempted to do this, but he became progressively weaker and kept vomiting. Consequently they came to the emergency department. Pt was admitted for further evaluation.  Clinical Impression  Pt admitted with above diagnosis. Pt currently with functional limitations due to the deficits listed below (see PT Problem List). At the time of PT eval pt was able to perform transfers with min guard assist. Gait training was not pursued as pt hooked up to NG suction with a moderate amount of output noted. Will keep on PT caseload for strengthening and to improve tolerance for functional activity during hospital stay. Pt will benefit from skilled PT to increase their independence and safety with mobility to allow discharge to the venue listed below.       Follow Up Recommendations No PT follow up    Equipment Recommendations  None recommended by PT    Recommendations for Other Services       Precautions / Restrictions Precautions Precautions: None Precaution Comments: NG tube Restrictions Weight Bearing Restrictions: No      Mobility  Bed Mobility Overal bed mobility: Modified Independent             General bed mobility  comments: No physical assistance required. Pt used bed rails for support and required increased time.   Transfers Overall transfer level: Needs assistance Equipment used: None Transfers: Sit to/from [facility] Sit to Stand: Supervision Stand pivot transfers: Min guard       General transfer comment: Pt was able to power-up to full standing without assistance. Somewhat unsteady initially but did not require assistance to recover. Close guard for safety as pt took pivotal steps around to the chair. Pt again demonstrated some unsteadiness while unsupported.  Ambulation/Gait             General Gait Details: Gait training deferred as pt hooked up to NG suction with a moderate amount of output noted during session.   Stairs            Wheelchair Mobility    Modified Rankin (Stroke Patients Only)       Balance Overall balance assessment: Needs assistance Sitting-balance support: Feet supported;No upper extremity supported Sitting balance-Leahy Scale: Good     Standing balance support: No upper extremity supported;During functional activity Standing balance-Leahy Scale: Fair Standing balance comment: Unsteady - required close guard but no physical assist to recover.                              Pertinent Vitals/Pain Pain Assessment: No/denies pain    Home Living Family/patient expects to be discharged to:: Private residence Living Arrangements: Spouse/significant other Available Help at Discharge: Family;Available 24 hours/day  Home Equipment: None      Prior Function Level of Independence: Independent               Hand Dominance        Extremity/Trunk Assessment   Upper Extremity Assessment: Overall WFL for tasks assessed           Lower Extremity Assessment: Generalized weakness      Cervical / Trunk Assessment: Normal  Communication   Communication: No difficulties  Cognition  Arousal/Alertness: Awake/alert Behavior During Therapy: WFL for tasks assessed/performed Overall Cognitive Status: Within Functional Limits for tasks assessed                      General Comments      Exercises        Assessment/Plan    PT Assessment Patient needs continued PT services  PT Diagnosis Difficulty walking;Generalized weakness   PT Problem List Decreased strength;Decreased range of motion;Decreased activity tolerance;Decreased balance;Decreased mobility;Decreased knowledge of use of DME;Decreased safety awareness;Decreased knowledge of precautions  PT Treatment Interventions DME instruction;Gait training;Stair training;Functional mobility training;Therapeutic activities;Therapeutic exercise;Neuromuscular re-education;Patient/family education   PT Goals (Current goals can be found in the Care Plan section) Acute Rehab PT Goals Patient Stated Goal: Increase strength PT Goal Formulation: With patient Time For Goal Achievement: 02/24/15 Potential to Achieve Goals: Good    Frequency Min 3X/week   Barriers to discharge        Co-evaluation               End of Session Equipment Utilized During Treatment: Gait belt Activity Tolerance: Patient tolerated treatment well Patient left: in chair;with call bell/phone within reach Nurse Communication: Mobility status    Functional Assessment Tool Used: Clinical judgement Functional Limitation: Mobility: Walking and moving around Mobility: Walking and Moving Around Current Status (J8119): At least 1 percent but less than 20 percent impaired, limited or restricted Mobility: Walking and Moving Around Goal Status [phone]): At least 1 percent but less than 20 percent impaired, limited or restricted    Time: 1136-1156 PT Time Calculation (min) (ACUTE ONLY): 20 min   Charges:   PT Evaluation $Initial PT Evaluation Tier I: 1 Procedure     PT G Codes:   PT G-Codes **NOT FOR INPATIENT CLASS** Functional  Assessment Tool Used: Clinical judgement Functional Limitation: Mobility: Walking and moving around Mobility: Walking and Moving Around Current Status (N5621): At least 1 percent but less than 20 percent impaired, limited or restricted Mobility: Walking and Moving Around Goal Status [phone]): At least 1 percent but less than 20 percent impaired, limited or restricted    Rolinda Roan 02/17/2015, 12:13 PM   Rolinda Roan, PT, DPT Acute Rehabilitation Services Pager: [phone]

## 2015-02-17 NOTE — Progress Notes (Signed)
PROGRESS NOTE  Benjamin Blake XYV:[id number] DOB: 1951/10/17 DOA: 02/16/2015 PCP: Default, Provider, MD  Assessment/Plan: Acute gastroenteritis Possibly viral versus related to chemotherapy Agree with Supportive care including IV hydration, antiemetics and replenishing lytes. Check stool for C. Difficile.  Ileus -dg abd -ng tube to suction  Acute kidney injury Likely secondary to dehydration. 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 . Chemotherapy as outpatient at [facility].  Hyponatremia -IVF  Code Status: full Family Communication: wife at bedside Disposition Plan:    Consultants:    Procedures:     HPI/Subjective: vomiting this AM  Objective: Filed Vitals:   02/17/15 0419  BP: 129/87  Pulse: 111  Temp: 98.2 F (36.8 C)  Resp: 17    Intake/Output Summary (Last 24 hours) at 02/17/15 0911 Last data filed at 02/17/15 0600  Gross per 24 hour  Intake   1405 ml  Output    500 ml  Net    905 ml   Filed Weights   02/16/15 0815 02/16/15 1607  Weight: 72.576 kg (160 lb) 70.7 kg (155 lb 13.8 oz)    Exam:   General:  A+Ox3, NAD  Cardiovascular: tachy  Respiratory: clear  Abdomen: no BS, distended, no flatus  Musculoskeletal: no edema   Data Reviewed: Basic Metabolic Panel:  Recent Labs Lab 02/16/15 1040 02/16/15 1051 02/16/15 1815 02/17/15 0500  NA 127* 125*  --  127*  K 3.3* 3.1*  --  4.1  CL 88* 90*  --  95*  CO2 22  --   --  21  GLUCOSE 180* 180*  --  148*  BUN 47* 46*  --  23  CREATININE 1.64* 1.50*  --  1.00  CALCIUM 9.2  --   --  8.2*  MG  --   --  2.4  --    Liver Function Tests:  Recent Labs Lab 02/16/15 1040  AST 31  ALT 22  ALKPHOS 70  BILITOT 1.4*  PROT 6.8  ALBUMIN 3.2*   No results for input(s): LIPASE, AMYLASE in the last 168 hours. No results for  input(s): AMMONIA in the last 168 hours. CBC:  Recent Labs Lab 02/16/15 1040 02/16/15 1051 02/17/15 0500  WBC 5.6  --  6.1  NEUTROABS 2.1  --   --   HGB 18.2* 18.7* 16.0  HCT 47.5 55.0* 44.1  MCV 78.4  --  80.0  PLT 185  --  158   Cardiac Enzymes: No results for input(s): CKTOTAL, CKMB, CKMBINDEX, TROPONINI in the last 168 hours. BNP (last 3 results) No results for input(s): BNP in the last 8760 hours.  ProBNP (last 3 results) No results for input(s): PROBNP in the last 8760 hours.  CBG: No results for input(s): GLUCAP in the last 168 hours.  No results found for this or any previous visit (from the past 240 hour(s)).   Studies: US Abdomen Complete  02/16/2015   CLINICAL DATA:  Abdominal pain, colon carcinoma, nausea and vomiting  EXAM: COMPLETE ABDOMINAL ULTRASOUND  COMPARISON:  None available  FINDINGS: Gallbladder: Physiologically distended without stones, wall thickening, or pericholecystic fluid. Non shadowing mobile tumefactive sludge. Sonographer reports no sonographic Murphy's sign.  Common bile duct:  Normal in caliber, 4.64mm diameter.  Liver: There are 2 echogenic lesions in the right lobe probably in segment 5, 28 x 14 x 18 mm and 16  x 13 x 16 mm. There is no intrahepatic biliary ductal dilatation. Background parenchyma is unremarkable.  IVC:  Negative  Pancreas: Visualized segments unremarkable, portions obscured by overlying bowel gas.  Spleen:  No focal lesion, craniocaudal 7.5cm in length.  Right Kidney: 18 x 16 x 15 mm cyst in the upper pole. No hydronephrosis. 9.7Cm in length.  Left Kidney:  No lesion or hydronephrosis, 11.2cm in length.  Abdominal aorta:  Negative  IMPRESSION: 1. Two focal liver lesions suggesting metastatic disease given the clinical history. 2. Mobile tumefactive sludge in the gallbladder without ultrasound evidence of cholecystitis. 3. Right renal cyst   Electronically Signed   By: Benjamin Blake M.D.   On: 02/16/2015 12:03   Dg Abd Acute  W/chest  02/16/2015   CLINICAL DATA:  Dehydration, decreased appetite, nausea, vomiting and diarrhea for 4 days, cancer patient with port for chemotherapy  EXAM: ACUTE ABDOMEN SERIES (ABDOMEN 2 VIEW & CHEST 1 VIEW)  COMPARISON:  None  FINDINGS: RIGHT jugular Port-A-Cath tip projecting over SVC near cavoatrial junction.  Normal heart size, mediastinal contours and pulmonary vascularity.  LEFT hilar versus perihilar mass 3.0 x 2.7 cm.  Lungs otherwise clear.  No infiltrate, pleural effusion or pneumothorax.  Prior bowel resection RIGHT upper quadrant question RIGHT hemicolectomy.  Normal bowel gas pattern.  No bowel dilatation, bowel wall thickening, or free intraperitoneal air.  LEFT pelvic phleboliths.  No definite urinary tract calcification or acute osseous findings.  IMPRESSION: 3.0 x 2.7 cm LEFT hilar versus perihilar mass question neoplasm; recommend correlation with any prior outside imaging patient has.  If chest has not been imaged previously, recommend assessment by CT chest with IV contrast to exclude primary or metastatic neoplasm as well as hilar adenopathy.  Question prior RIGHT colon resection.  No acute abdominal findings.   Electronically Signed   By: Lavonia Dana M.D.   On: 02/16/2015 10:22    Scheduled Meds: . HYDROmorphone      . heparin  5,000 Units Subcutaneous 3 times per day  . sodium chloride  3 mL Intravenous Q12H   Continuous Infusions: . 0.9 % NaCl with KCl 40 mEq / L 125 mL/hr (02/17/15 0445)   Antibiotics Given (last 72 hours)    None      Principal Problem:   Chemotherapy-induced vomiting Active Problems:   AKI (acute kidney injury)   Hypokalemia   Hyponatremia   Acute kidney injury   Acute gastroenteritis    Time spent: 25 min    Benjamin Blake  [facility] Pager [phone]. If 7PM-7AM, please contact night-coverage at [email or web address], password [facility] 02/17/2015, 9:11 AM

## 2015-02-18 ENCOUNTER — Inpatient Hospital Stay (HOSPITAL_COMMUNITY): Payer: BLUE CROSS/BLUE SHIELD

## 2015-02-18 ENCOUNTER — Encounter (HOSPITAL_COMMUNITY): Payer: Self-pay | Admitting: Radiology

## 2015-02-18 DIAGNOSIS — R112 Nausea with vomiting, unspecified: Secondary | ICD-10-CM | POA: Diagnosis present

## 2015-02-18 DIAGNOSIS — T17908A Unspecified foreign body in respiratory tract, part unspecified causing other injury, initial encounter: Secondary | ICD-10-CM | POA: Diagnosis not present

## 2015-02-18 DIAGNOSIS — R109 Unspecified abdominal pain: Secondary | ICD-10-CM | POA: Insufficient documentation

## 2015-02-18 DIAGNOSIS — J9601 Acute respiratory failure with hypoxia: Secondary | ICD-10-CM

## 2015-02-18 DIAGNOSIS — R1011 Right upper quadrant pain: Secondary | ICD-10-CM

## 2015-02-18 DIAGNOSIS — T17998A Other foreign object in respiratory tract, part unspecified causing other injury, initial encounter: Secondary | ICD-10-CM

## 2015-02-18 DIAGNOSIS — Z08 Encounter for follow-up examination after completed treatment for malignant neoplasm: Secondary | ICD-10-CM

## 2015-02-18 DIAGNOSIS — N179 Acute kidney failure, unspecified: Principal | ICD-10-CM

## 2015-02-18 DIAGNOSIS — J9600 Acute respiratory failure, unspecified whether with hypoxia or hypercapnia: Secondary | ICD-10-CM | POA: Diagnosis present

## 2015-02-18 DIAGNOSIS — C189 Malignant neoplasm of colon, unspecified: Secondary | ICD-10-CM | POA: Diagnosis present

## 2015-02-18 LAB — TSH: TSH: 1.076 u[IU]/mL (ref 0.350–4.500)

## 2015-02-18 LAB — URINE MICROSCOPIC-ADD ON

## 2015-02-18 LAB — URINALYSIS, ROUTINE W REFLEX MICROSCOPIC
Bilirubin Urine: NEGATIVE
Glucose, UA: NEGATIVE mg/dL
Ketones, ur: 15 mg/dL — AB
LEUKOCYTES UA: NEGATIVE
NITRITE: NEGATIVE
PH: 5.5 (ref 5.0–8.0)
Protein, ur: 30 mg/dL — AB
Urobilinogen, UA: 1 mg/dL (ref 0.0–1.0)

## 2015-02-18 LAB — POCT I-STAT 3, ART BLOOD GAS (G3+)
ACID-BASE DEFICIT: 8 mmol/L — AB (ref 0.0–2.0)
Bicarbonate: 17.5 mEq/L — ABNORMAL LOW (ref 20.0–24.0)
O2 SAT: 95 %
TCO2: 19 mmol/L (ref 0–100)
pCO2 arterial: 35.4 mmHg (ref 35.0–45.0)
pH, Arterial: 7.302 — ABNORMAL LOW (ref 7.350–7.450)
pO2, Arterial: 80 mmHg (ref 80.0–100.0)

## 2015-02-18 LAB — LACTIC ACID, PLASMA: Lactic Acid, Venous: 3.4 mmol/L (ref 0.5–2.0)

## 2015-02-18 LAB — BASIC METABOLIC PANEL
Anion gap: 7 (ref 5–15)
BUN: 30 mg/dL — ABNORMAL HIGH (ref 6–23)
CHLORIDE: 104 mmol/L (ref 96–112)
CO2: 22 mmol/L (ref 19–32)
Calcium: 6.9 mg/dL — ABNORMAL LOW (ref 8.4–10.5)
Creatinine, Ser: 0.99 mg/dL (ref 0.50–1.35)
GFR calc Af Amer: >90 mL/min (ref >90)
GFR calc non Af Amer: 87 mL/min — ABNORMAL LOW (ref >90)
Glucose, Bld: 134 mg/dL — ABNORMAL HIGH (ref 70–99)
Potassium: 4.5 mmol/L (ref 3.5–5.1)
Sodium: 133 mmol/L — ABNORMAL LOW (ref 135–145)

## 2015-02-18 LAB — TYPE AND SCREEN
ABO/RH(D): A POS
Antibody Screen: NEGATIVE

## 2015-02-18 LAB — CBC
HCT: 35.1 % — ABNORMAL LOW (ref 39.0–52.0)
HEMOGLOBIN: 12.5 g/dL — AB (ref 13.0–17.0)
MCH: 29.2 pg (ref 26.0–34.0)
MCHC: 35.6 g/dL (ref 30.0–36.0)
MCV: 82 fL (ref 78.0–100.0)
Platelets: 138 10*3/uL — ABNORMAL LOW (ref 150–400)
RBC: 4.28 MIL/uL (ref 4.22–5.81)
RDW: 17.3 % — ABNORMAL HIGH (ref 11.5–15.5)
WBC: 9 10*3/uL (ref 4.0–10.5)

## 2015-02-18 LAB — MRSA PCR SCREENING: MRSA by PCR: NEGATIVE

## 2015-02-18 LAB — ABO/RH: ABO/RH(D): A POS

## 2015-02-18 LAB — LACTATE DEHYDROGENASE: LDH: 222 U/L (ref 94–250)

## 2015-02-18 LAB — GLUCOSE, CAPILLARY: GLUCOSE-CAPILLARY: 148 mg/dL — AB (ref 70–99)

## 2015-02-18 LAB — PATHOLOGIST SMEAR REVIEW

## 2015-02-18 MED ORDER — METOPROLOL TARTRATE 1 MG/ML IV SOLN
5.0000 mg | INTRAVENOUS | Status: DC
  Administered 2015-02-18: 5 mg via INTRAVENOUS
  Filled 2015-02-18: qty 5

## 2015-02-18 MED ORDER — PHENYLEPHRINE HCL 10 MG/ML IJ SOLN
30.0000 ug/min | INTRAVENOUS | Status: DC
  Administered 2015-02-18 (×2): 50 ug/min via INTRAVENOUS
  Filled 2015-02-18 (×2): qty 1

## 2015-02-18 MED ORDER — AMIODARONE HCL IN DEXTROSE 360-4.14 MG/200ML-% IV SOLN
30.0000 mg/h | INTRAVENOUS | Status: DC
  Administered 2015-02-19: 30 mg/h via INTRAVENOUS
  Filled 2015-02-18 (×4): qty 200

## 2015-02-18 MED ORDER — FENTANYL CITRATE 0.05 MG/ML IJ SOLN
100.0000 ug | Freq: Once | INTRAMUSCULAR | Status: AC
  Administered 2015-02-18: 100 ug via INTRAVENOUS

## 2015-02-18 MED ORDER — ESMOLOL BOLUS VIA INFUSION
500.0000 ug/kg | Freq: Once | INTRAVENOUS | Status: AC
  Administered 2015-02-18: 37200 ug via INTRAVENOUS
  Filled 2015-02-18: qty 38000

## 2015-02-18 MED ORDER — WHITE PETROLATUM GEL
Status: AC
  Administered 2015-02-18: 1
  Filled 2015-02-18: qty 1

## 2015-02-18 MED ORDER — ETOMIDATE 2 MG/ML IV SOLN
20.0000 mg | Freq: Once | INTRAVENOUS | Status: AC
  Administered 2015-02-18: 20 mg via INTRAVENOUS

## 2015-02-18 MED ORDER — PIPERACILLIN-TAZOBACTAM 3.375 G IVPB
3.3750 g | Freq: Three times a day (TID) | INTRAVENOUS | Status: DC
  Filled 2015-02-18 (×2): qty 50

## 2015-02-18 MED ORDER — AMIODARONE LOAD VIA INFUSION
150.0000 mg | Freq: Once | INTRAVENOUS | Status: AC
  Administered 2015-02-18: 150 mg via INTRAVENOUS
  Filled 2015-02-18: qty 83.34

## 2015-02-18 MED ORDER — METOPROLOL TARTRATE 1 MG/ML IV SOLN
5.0000 mg | Freq: Once | INTRAVENOUS | Status: AC
  Administered 2015-02-18: 5 mg via INTRAVENOUS
  Filled 2015-02-18: qty 5

## 2015-02-18 MED ORDER — FLUCONAZOLE IN SODIUM CHLORIDE 400-0.9 MG/200ML-% IV SOLN
800.0000 mg | Freq: Once | INTRAVENOUS | Status: AC
  Administered 2015-02-18: 800 mg via INTRAVENOUS
  Filled 2015-02-18: qty 400

## 2015-02-18 MED ORDER — ESMOLOL HCL-SODIUM CHLORIDE 2000 MG/100ML IV SOLN
25.0000 ug/kg/min | INTRAVENOUS | Status: DC
  Administered 2015-02-18: 25 ug/kg/min via INTRAVENOUS
  Filled 2015-02-18: qty 100

## 2015-02-18 MED ORDER — SODIUM CHLORIDE 0.9 % IV SOLN
500.0000 mg | Freq: Four times a day (QID) | INTRAVENOUS | Status: DC
  Administered 2015-02-18 – 2015-02-19 (×4): 500 mg via INTRAVENOUS
  Filled 2015-02-18 (×6): qty 500

## 2015-02-18 MED ORDER — FLUCONAZOLE IN SODIUM CHLORIDE 400-0.9 MG/200ML-% IV SOLN
400.0000 mg | INTRAVENOUS | Status: DC
  Filled 2015-02-18: qty 200

## 2015-02-18 MED ORDER — MIDAZOLAM HCL 2 MG/2ML IJ SOLN
INTRAMUSCULAR | Status: AC
  Administered 2015-02-18: 2 mg via INTRAVENOUS
  Filled 2015-02-18: qty 4

## 2015-02-18 MED ORDER — SODIUM CHLORIDE 0.9 % IV SOLN
INTRAVENOUS | Status: DC
  Administered 2015-02-18 (×2): via INTRAVENOUS

## 2015-02-18 MED ORDER — CHLORHEXIDINE GLUCONATE 0.12 % MT SOLN
15.0000 mL | Freq: Two times a day (BID) | OROMUCOSAL | Status: DC
  Administered 2015-02-18 – 2015-02-19 (×2): 15 mL via OROMUCOSAL
  Filled 2015-02-18 (×2): qty 15

## 2015-02-18 MED ORDER — SODIUM CHLORIDE 0.9 % IV BOLUS (SEPSIS)
2250.0000 mL | Freq: Once | INTRAVENOUS | Status: AC
  Administered 2015-02-18: 2250 mL via INTRAVENOUS

## 2015-02-18 MED ORDER — SODIUM CHLORIDE 0.9 % IV SOLN
25.0000 ug/h | INTRAVENOUS | Status: DC
  Administered 2015-02-18: 50 ug/h via INTRAVENOUS
  Filled 2015-02-18 (×2): qty 50

## 2015-02-18 MED ORDER — FENTANYL CITRATE 0.05 MG/ML IJ SOLN
50.0000 ug | Freq: Once | INTRAMUSCULAR | Status: AC
  Administered 2015-02-18: 50 ug via INTRAVENOUS

## 2015-02-18 MED ORDER — PIPERACILLIN-TAZOBACTAM 3.375 G IVPB 30 MIN
3.3750 g | Freq: Once | INTRAVENOUS | Status: AC
  Administered 2015-02-18: 3.375 g via INTRAVENOUS
  Filled 2015-02-18: qty 50

## 2015-02-18 MED ORDER — FENTANYL CITRATE 0.05 MG/ML IJ SOLN
100.0000 ug | INTRAMUSCULAR | Status: DC | PRN
  Administered 2015-02-18 (×2): 100 ug via INTRAVENOUS
  Filled 2015-02-18: qty 2

## 2015-02-18 MED ORDER — PANTOPRAZOLE SODIUM 40 MG IV SOLR
40.0000 mg | Freq: Every day | INTRAVENOUS | Status: DC
  Administered 2015-02-18 – 2015-02-19 (×2): 40 mg via INTRAVENOUS
  Filled 2015-02-18: qty 40

## 2015-02-18 MED ORDER — IOHEXOL 350 MG/ML SOLN
75.0000 mL | Freq: Once | INTRAVENOUS | Status: AC | PRN
  Administered 2015-02-18: 75 mL via INTRAVENOUS

## 2015-02-18 MED ORDER — VANCOMYCIN HCL IN DEXTROSE 750-5 MG/150ML-% IV SOLN
750.0000 mg | Freq: Two times a day (BID) | INTRAVENOUS | Status: DC
  Administered 2015-02-18 – 2015-02-19 (×3): 750 mg via INTRAVENOUS
  Filled 2015-02-18 (×5): qty 150

## 2015-02-18 MED ORDER — SODIUM CHLORIDE 0.9 % IV BOLUS (SEPSIS)
250.0000 mL | Freq: Once | INTRAVENOUS | Status: AC
  Administered 2015-02-18: 250 mL via INTRAVENOUS

## 2015-02-18 MED ORDER — FENTANYL CITRATE 0.05 MG/ML IJ SOLN: 100.0000 ug | INTRAMUSCULAR | Status: DC | PRN

## 2015-02-18 MED ORDER — MIDAZOLAM HCL 2 MG/2ML IJ SOLN
2.0000 mg | Freq: Once | INTRAMUSCULAR | Status: AC
  Administered 2015-02-18: 2 mg via INTRAVENOUS

## 2015-02-18 MED ORDER — ADENOSINE 6 MG/2ML IV SOLN
INTRAVENOUS | Status: AC
  Administered 2015-02-18: 6 mg
  Filled 2015-02-18: qty 2

## 2015-02-18 MED ORDER — IOHEXOL 300 MG/ML  SOLN
25.0000 mL | INTRAMUSCULAR | Status: AC
  Administered 2015-02-18 (×2): 25 mL via ORAL

## 2015-02-18 MED ORDER — FENTANYL CITRATE 0.05 MG/ML IJ SOLN
INTRAMUSCULAR | Status: AC
  Administered 2015-02-18: 100 ug via INTRAVENOUS
  Filled 2015-02-18: qty 4

## 2015-02-18 MED ORDER — MIDAZOLAM HCL 5 MG/ML IJ SOLN
1.0000 mg/h | INTRAMUSCULAR | Status: DC
  Administered 2015-02-18: 1 mg/h via INTRAVENOUS
  Filled 2015-02-18: qty 10

## 2015-02-18 MED ORDER — CETYLPYRIDINIUM CHLORIDE 0.05 % MT LIQD
7.0000 mL | Freq: Four times a day (QID) | OROMUCOSAL | Status: DC
  Administered 2015-02-18 – 2015-02-19 (×4): 7 mL via OROMUCOSAL

## 2015-02-18 MED ORDER — AMIODARONE HCL IN DEXTROSE 360-4.14 MG/200ML-% IV SOLN
60.0000 mg/h | INTRAVENOUS | Status: AC
  Administered 2015-02-18 (×2): 60 mg/h via INTRAVENOUS
  Filled 2015-02-18: qty 200

## 2015-02-18 MED ORDER — DEXTROSE-NACL 5-0.45 % IV SOLN
INTRAVENOUS | Status: DC
  Administered 2015-02-18: 11:00:00 via INTRAVENOUS

## 2015-02-18 MED ORDER — PHENYLEPHRINE HCL 10 MG/ML IJ SOLN
30.0000 ug/min | INTRAVENOUS | Status: DC
  Administered 2015-02-18: 70 ug/min via INTRAVENOUS
  Filled 2015-02-18: qty 4

## 2015-02-18 MED ORDER — FENTANYL BOLUS VIA INFUSION
50.0000 ug | INTRAVENOUS | Status: DC | PRN
  Filled 2015-02-18: qty 50

## 2015-02-18 NOTE — Progress Notes (Addendum)
Shift Event: Pt hypoxic, 85% sats on RA. Tachycardic HR 136, RR 16, BP 111/63.  Placed on NRB. Record reviewed pt with PMH of metastatic colon cancer admitted with acute gastroenteritis, acute kidney injury. At bedside, Caucasian male sitting in chair in some respiratory distress, with NG tube. Lungs- diminished air sound in left lung field, no wheeze or crackles. Heart- tachycardic. Abd- faint bowel sounds. Extrem- no edema.   Acute Hypoxic Respiratory Failure likely secondary to ?infilterate vs pulmonary hemorrahage -Pt became hypoxic requiring NRB. CTA was negative for PE , ?infilterate vs pulmonary hemorrhage.  -ABG 7.04/03/62; CXR- no abnormalities, BNP 59.4. LA elevated at 4.7. Pt is transferred to SDU for closer monitoring.  -Placed on van/zosyn per pharm  -PCCM consulted for pulmonary hemorrhage, will evaluate pt  Sinus Tachycardia -EKG- Sinus Tachy, inferior/anterior infarct (unknown baseline) -Metoprolol 5mg  x1  Lacy Duverney [facility] [phone]

## 2015-02-18 NOTE — Progress Notes (Signed)
Pt transferred to 40M03 per Dr.Feinstein orders d/t respiratory status. Report given to 40M RN at bedside. RR RN and critical care team at bedside. Pt's wife and daughter in 2nd floor waiting room.

## 2015-02-18 NOTE — Progress Notes (Signed)
[facility] PROGRESS NOTE  Benjamin Blake UDJ:[id number] DOB: 28-Aug-1951 DOA: 02/16/2015 PCP: Default, Provider, MD  Summary 63 male with metastatic colon cancer, getting chemotherapy at [facility].  Presented with intractable vomiting.  Having stools. NG placed 3/13.  abd xray shows ileus v. Early obstruction. Overnight, became hypoxic and transferred to SDU.  CTA chest without PE, but shows diffuse patchy infiltrates, started on vanc/zosyn and PCCM consulted by night coverage.  Assessment/Plan:   Acute respiratory failure: coughing up copious amounts brown fluid almost continuously.  Per wife "worse when he drinks water".  Looks like gastric contents.  Suspect aspiration.  Asked RN to check NGT.  CTA chest without any evidence of esophageal injury/fistula/perforation.  PCCM here evaluating patient and will transfer to ICU Active Problems:   AKI (acute kidney injury) improving with IVF   Hypokalemia resolved   Hyponatremia   Nausea with vomiting: ileus? Partial obstruction? Chemo related?   Colon cancer, metastatic  Code Status:  full Family Communication:  Wife at bedside Disposition Plan:  Transfer to ICU, PCCM to assume care  Consultants:    Procedures:     Antibiotics:  Vanc, zosyn 3/14  HPI/Subjective: Coughing up large amounts brown fluid. Some dyspnea.  Cant rest.  Has stool yesterday. No hemoptysis  Objective: Filed Vitals:   02/18/15 0732  BP: 110/75  Pulse: 116  Temp: 97.5 F (36.4 C)  Resp: 26    Intake/Output Summary (Last 24 hours) at 02/18/15 0840 Last data filed at 02/18/15 0649  Gross per 24 hour  Intake 5371.25 ml  Output   4075 ml  Net 1296.25 ml   Filed Weights   02/16/15 0815 02/16/15 1607 02/18/15 0126  Weight: 72.576 kg (160 lb) 70.7 kg (155 lb 13.8 oz) 74.4 kg (164 lb 0.4 oz)    Exam:   General:  Uncomfortable.  Coughing copious brownish fluid   HEENT:  NG draining brown.  Cardiovascular: RRR without MGR  Respiratory: CTA  without WRR  Abdomen: S, NT, ND  Ext: no CCE  Basic Metabolic Panel:  Recent Labs Lab 02/16/15 1040 02/16/15 1051 02/16/15 1815 02/17/15 0500 02/17/15 2130  NA 127* 125*  --  127* 128*  K 3.3* 3.1*  --  4.1 5.0  CL 88* 90*  --  95* 94*  CO2 22  --   --  21 21  GLUCOSE 180* 180*  --  148* 108*  BUN 47* 46*  --  23 31*  CREATININE 1.64* 1.50*  --  1.00 1.27  CALCIUM 9.2  --   --  8.2* 8.2*  MG  --   --  2.4  --   --    Liver Function Tests:  Recent Labs Lab 02/16/15 1040  AST 31  ALT 22  ALKPHOS 70  BILITOT 1.4*  PROT 6.8  ALBUMIN 3.2*   No results for input(s): LIPASE, AMYLASE in the last 168 hours. No results for input(s): AMMONIA in the last 168 hours. CBC:  Recent Labs Lab 02/16/15 1040 02/16/15 1051 02/17/15 0500 02/17/15 2130  WBC 5.6  --  6.1 7.6  NEUTROABS 2.1  --   --   --   HGB 18.2* 18.7* 16.0 15.0  HCT 47.5 55.0* 44.1 41.4  MCV 78.4  --  80.0 80.7  PLT 185  --  158 153   Cardiac Enzymes: No results for input(s): CKTOTAL, CKMB, CKMBINDEX, TROPONINI in the last 168 hours. BNP (last 3 results)  Recent Labs  02/17/15 2200  BNP 59.4  ProBNP (last 3 results) No results for input(s): PROBNP in the last 8760 hours.  CBG: No results for input(s): GLUCAP in the last 168 hours.  Recent Results (from the past 240 hour(s))  Clostridium Difficile by PCR     Status: None   Collection Time: 02/16/15 10:01 PM  Result Value Ref Range Status   C difficile by pcr NEGATIVE NEGATIVE Final  MRSA PCR Screening     Status: None   Collection Time: 02/18/15  1:30 AM  Result Value Ref Range Status   MRSA by PCR NEGATIVE NEGATIVE Final    Comment:        The GeneXpert MRSA Assay (FDA approved for NASAL specimens only), is one component of a comprehensive MRSA colonization surveillance program. It is not intended to diagnose MRSA infection nor to guide or monitor treatment for MRSA infections.      Studies: Ct Angio Chest Pe W/cm &/or Wo  Cm  02/18/2015   CLINICAL DATA:  Dyspnea  EXAM: CT ANGIOGRAPHY CHEST WITH CONTRAST  TECHNIQUE: Multidetector CT imaging of the chest was performed using the standard protocol during bolus administration of intravenous contrast. Multiplanar CT image reconstructions and MIPs were obtained to evaluate the vascular anatomy.  CONTRAST:  62mL OMNIPAQUE IOHEXOL 350 MG/ML SOLN  COMPARISON:  Radiographs 02/17/2015  FINDINGS: Cardiovascular: There is good opacification of the pulmonary arteries. There is no pulmonary embolism. The thoracic aorta is normal in caliber and intact.  Lungs: There are multifocal airspace opacities, involving the right upper lobe to the greatest degree but also both lower lobes and minimal involvement in the right middle lobe. These opacities may represent multifocal pneumonia. Pulmonary hemorrhage could also have this appearance.  Central airways: Patent  Effusions: None  Lymphadenopathy: None  Esophagus: Nasogastric tube extends to the stomach. Mild uniform mural thickening is suggested throughout the esophagus.  Upper abdomen: 1.5 cm hypodense lesion at the upper pole of the right kidney corresponding to 1 of the renal cysts observed on recent sonography. Low-attenuation lesions in the liver, indeterminate and incompletely imaged.  Musculoskeletal: No significant abnormalities  Review of the MIP images confirms the above findings.  IMPRESSION: 1. Negative for pulmonary embolism 2. Multifocal airspace opacities in both lungs, possibly due to infectious infiltrates. Pulmonary hemorrhages could also have this appearance. 3. Low-attenuation liver lesions, not simple cysts. These could represent metastases.   Electronically Signed   By: Andreas Newport M.D.   On: 02/18/2015 03:45   US Abdomen Complete  02/16/2015   CLINICAL DATA:  Abdominal pain, colon carcinoma, nausea and vomiting  EXAM: COMPLETE ABDOMINAL ULTRASOUND  COMPARISON:  None available  FINDINGS: Gallbladder: Physiologically  distended without stones, wall thickening, or pericholecystic fluid. Non shadowing mobile tumefactive sludge. Sonographer reports no sonographic Murphy's sign.  Common bile duct:  Normal in caliber, 4.5mm diameter.  Liver: There are 2 echogenic lesions in the right lobe probably in segment 5, 28 x 14 x 18 mm and 16 x 13 x 16 mm. There is no intrahepatic biliary ductal dilatation. Background parenchyma is unremarkable.  IVC:  Negative  Pancreas: Visualized segments unremarkable, portions obscured by overlying bowel gas.  Spleen:  No focal lesion, craniocaudal 7.5cm in length.  Right Kidney: 18 x 16 x 15 mm cyst in the upper pole. No hydronephrosis. 9.7Cm in length.  Left Kidney:  No lesion or hydronephrosis, 11.2cm in length.  Abdominal aorta:  Negative  IMPRESSION: 1. Two focal liver lesions suggesting metastatic disease given the clinical history. 2. Mobile tumefactive  sludge in the gallbladder without ultrasound evidence of cholecystitis. 3. Right renal cyst   Electronically Signed   By: Lucrezia Europe M.D.   On: 02/16/2015 12:03   Dg Chest Port 1 View  02/17/2015   CLINICAL DATA:  Severe dyspnea and wheezing  EXAM: PORTABLE CHEST - 1 VIEW  COMPARISON:  02/16/2015  FINDINGS: There is a right sided Port-A-Cath with tip in the region of the cavoatrial junction. The nasogastric tube extends below the diaphragm and off the inferior edge of the image. The lungs are clear. The pulmonary vasculature is normal. There are no large effusions.  IMPRESSION: No active disease.   Electronically Signed   By: Andreas Newport M.D.   On: 02/17/2015 21:28   Dg Abd Acute W/chest  02/16/2015   CLINICAL DATA:  Dehydration, decreased appetite, nausea, vomiting and diarrhea for 4 days, cancer patient with port for chemotherapy  EXAM: ACUTE ABDOMEN SERIES (ABDOMEN 2 VIEW & CHEST 1 VIEW)  COMPARISON:  None  FINDINGS: RIGHT jugular Port-A-Cath tip projecting over SVC near cavoatrial junction.  Normal heart size, mediastinal contours and  pulmonary vascularity.  LEFT hilar versus perihilar mass 3.0 x 2.7 cm.  Lungs otherwise clear.  No infiltrate, pleural effusion or pneumothorax.  Prior bowel resection RIGHT upper quadrant question RIGHT hemicolectomy.  Normal bowel gas pattern.  No bowel dilatation, bowel wall thickening, or free intraperitoneal air.  LEFT pelvic phleboliths.  No definite urinary tract calcification or acute osseous findings.  IMPRESSION: 3.0 x 2.7 cm LEFT hilar versus perihilar mass question neoplasm; recommend correlation with any prior outside imaging patient has.  If chest has not been imaged previously, recommend assessment by CT chest with IV contrast to exclude primary or metastatic neoplasm as well as hilar adenopathy.  Question prior RIGHT colon resection.  No acute abdominal findings.   Electronically Signed   By: Lavonia Dana M.D.   On: 02/16/2015 10:22   Dg Abd Portable 2v  02/17/2015   CLINICAL DATA:  Vomiting, orogastric tube  EXAM: PORTABLE ABDOMEN - 2 VIEW  COMPARISON:  Portable exam 1238 hr compared to 02/16/2015  FINDINGS: Small amount colonic gas.  Increased small bowel gas gaseous distention since previous exam.  No definite bowel wall thickening or free intraperitoneal air.  Tip of orogastric tube at proximal stomach.  Bones unremarkable.  Anastomotic staple line in RIGHT upper quadrant question prior RIGHT hemicolectomy.  No acute osseous findings.  IMPRESSION: Increase in small bowel distention since the previous exam, could represent ileus or developing small bowel obstruction.  Consider followup CT imaging with IV and oral contrast to assess if clinically indicated.   Electronically Signed   By: Lavonia Dana M.D.   On: 02/17/2015 13:20    Scheduled Meds: . heparin  5,000 Units Subcutaneous 3 times per day  . piperacillin-tazobactam (ZOSYN)  IV  3.375 g Intravenous Q8H  . sodium chloride  3 mL Intravenous Q12H  . vancomycin  750 mg Intravenous Q12H   Continuous Infusions: . 0.9 % NaCl with KCl 40  mEq / L 125 mL/hr (02/18/15 5790)    Time spent: 35 minutes  [facility]  [email or web address], password [email or web address] 02/18/2015, 8:40 AM  LOS: 1 day

## 2015-02-18 NOTE — Progress Notes (Signed)
PT Cancellation Note  Patient Details Name: Benjamin Blake MRN: [id number] DOB: 1951-11-22   Cancelled Treatment:    Reason Eval/Treat Not Completed: Patient not medically ready.  Noted pt with Respiratory decline and transfer to 74M.  Will hold PT today and f/u as appropriate.     Cahlil Sattar, Thornton Papas 02/18/2015, 10:44 AM

## 2015-02-18 NOTE — Procedures (Signed)
Central Venous Catheter Insertion Procedure Note Benjamin Blake [id number] 12/24/1950  Procedure: Insertion of Central Venous Catheter Indications: Assessment of intravascular volume and Drug and/or fluid administration  Procedure Details Consent: Risks of procedure as well as the alternatives and risks of each were explained to the (patient/caregiver).  Consent for procedure obtained. Time Out: Verified patient identification, verified procedure, site/side was marked, verified correct patient position, special equipment/implants available, medications/allergies/relevent history reviewed, required imaging and test results available.  Performed  Maximum sterile technique was used including antiseptics, cap, gloves, gown, hand hygiene, mask and sheet. Skin prep: Chlorhexidine; local anesthetic administered A antimicrobial bonded/coated triple lumen catheter was placed in the left internal jugular vein using the Seldinger technique.  Evaluation Blood flow good Complications: No apparent complications Patient did tolerate procedure well. Chest X-ray ordered to verify placement.  CXR: pending.  Benjamin Blake 02/18/2015, 1:22 PM  [place]  Benjamin Blake J. Titus Mould, MD, [place] Pgr: [facility]

## 2015-02-18 NOTE — Progress Notes (Signed)
eLink Physician-Brief Progress Note Patient Name: Benjamin Blake DOB: 06/25/1951 MRN: [id number]   Date of Service  02/18/2015  HPI/Events of Note  Called d/t intubated and ventilated patient awake on 200 mcg/hour Fentanyl IV infusion.  eICU Interventions  Will order: 1. Versed IV infusion at 0.5 mg/hour. Titrate to RASS = 0 to -2. Maximum dose = 8 mg/hour.     Intervention Category Minor Interventions: Agitation / anxiety - evaluation and management  Lysle Dingwall 02/18/2015, 5:48 PM

## 2015-02-18 NOTE — Progress Notes (Addendum)
ANTIBIOTIC CONSULT NOTE - INITIAL  Pharmacy Consult for Vancocin and Zosyn Indication: rule out pneumonia  No Known Allergies  Patient Measurements: Height: 5\' 8"  (172.7 cm) Weight: 164 lb 0.4 oz (74.4 kg) IBW/kg (Calculated) : 68.4  Vital Signs: Temp: 97.4 F (36.3 C) (03/14 0317) Temp Source: Axillary (03/14 0317) BP: 114/80 mmHg (03/14 0420) Pulse Rate: 108 (03/14 0420)  Labs:  Recent Labs  02/16/15 1040 02/16/15 1051 02/17/15 0500 02/17/15 2130  WBC 5.6  --  6.1 7.6  HGB 18.2* 18.7* 16.0 15.0  PLT 185  --  158 153  CREATININE 1.64* 1.50* 1.00 1.27   Estimated Creatinine Clearance: 59.1 mL/min (by C-G formula based on Cr of 1.27).   Microbiology: Recent Results (from the past 720 hour(s))  Clostridium Difficile by PCR     Status: None   Collection Time: 02/16/15 10:01 PM  Result Value Ref Range Status   C difficile by pcr NEGATIVE NEGATIVE Final  MRSA PCR Screening     Status: None   Collection Time: 02/18/15  1:30 AM  Result Value Ref Range Status   MRSA by PCR NEGATIVE NEGATIVE Final    Comment:        The GeneXpert MRSA Assay (FDA approved for NASAL specimens only), is one component of a comprehensive MRSA colonization surveillance program. It is not intended to diagnose MRSA infection nor to guide or monitor treatment for MRSA infections.     Medical History: Past Medical History  Diagnosis Date  . Cancer     Medications:  Prescriptions prior to admission  Medication Sig Dispense Refill Last Dose  . capecitabine (XELODA) 500 MG tablet Take 1,500 mg by mouth See admin instructions. Take 3 tab (1500 mg) in AM and 3 tab (1500 mg) in PM (12 hours apart) by mouth with water AFTER meal days 1-14. OFF days 15-21   Past Week at Unknown time  . Cetirizine HCl 10 MG CAPS Take 10 mg by mouth daily.   02/15/2015 at Unknown time  . ondansetron (ZOFRAN) 8 MG tablet Take by mouth every 8 (eight) hours as needed for nausea or vomiting.   02/15/2015 at Unknown  time  . prochlorperazine (COMPAZINE) 10 MG tablet Take 10 mg by mouth every 6 (six) hours as needed for nausea or vomiting.   02/15/2015 at Unknown time  . rosuvastatin (CRESTOR) 10 MG tablet Take 10 mg by mouth daily.   02/15/2015 at Unknown time  . senna-docusate (SENOKOT-S) 8.6-50 MG per tablet Take 1 tablet by mouth daily as needed.   Past Week at Unknown time   Scheduled:  . white petrolatum      . heparin  5,000 Units Subcutaneous 3 times per day  . sodium chloride  3 mL Intravenous Q12H   Infusions:  . 0.9 % NaCl with KCl 40 mEq / L 125 mL/hr (02/18/15 1740)    Assessment: 64yo male admitted 3/12 w/ acute gastroenteritis, AKI, and abdominal pain, became hypoxic overnight, CT concerning for infectious infiltrates, to begin IV ABX; SCr has improved.  Goal of Therapy:  Vancomycin trough level 15-20 mcg/ml  Plan:  Will start vancomycin 750mg  IV Q8H and Zosyn 3.375g IV Q8H and monitor CBC, Cx, levels prn.  Wynona Neat, PharmD, BCPS  02/18/2015,7:25 AM    [place]  -Changing from Zosyn to Primaxin -Primaxin 500 mg IV q6h -Watch renal fx, cultures    Hughes Better, PharmD, BCPS Clinical Pharmacist Pager: [phone] 02/18/2015 2:27 PM

## 2015-02-18 NOTE — Progress Notes (Signed)
PULMONARY / CRITICAL CARE MEDICINE   Name: Benjamin Blake MRN: [id number] DOB: [birth year]    ADMISSION DATE:  02/16/2015 CONSULTATION DATE:  02/18/2015  REFERRING MD : edp  CHIEF COMPLAINT:  hypoxia  INITIAL PRESENTATION: 64 year old male with metastatic colon cancer (undergoing chemotherapy) admitted 3/12 for intractable vomiting and diarrhea. Questionable early obstruction vs ileus. Expectorating large amounts of brown secretions resembling NGT output. CTA chest negative for PE. PCCM consulted for possible pulmonary hemorrhage in presence of hypoxia and increasing lactate.   STUDIES:  Chest CTA 3/14 - . Negative for pulmonary embolism. Multifocal airspace opacities in both lungs, possibly due to infectious infiltrates. Pulmonary hemorrhages could also have this appearance.  Low-attenuation liver lesions, not simple cysts. These could represent metastases. Abdomen/pelvis CT 3/14 -   SIGNIFICANT EVENTS: 3/14 - intubated 3/14 fib rvr   HISTORY OF PRESENT ILLNESS:  64 year old male with metastatic colon cancer (currently undergoing chemotherapy at [facility]) admitted 3/12 for intractable vomiting and diarrhea. Colon cancer originally diagnosed in 2010 and had surgery at that time. Recurrence in 2015 with metastases to liver, peritoneum, and lung. Last chemotherapy treatment 01/31/15. Vomiting and diarrhea began 3/10 after dinner. Attempted pushing fluids and Pedialyte per oncologist recommendation but patient became progressively weaker and continued vomiting with black emesis.  CT abdomen/chest without acute abdominal findings. Abdominal xray 3/13 shows possible ileus vs early obstruction. Early am 3/14 became hypoxic requiring NRB. CTA chest negative for PE, multifocal airspace opacities in both lungs possibly due to infectious infiltrates or pulmonary hemorrhage. Patient expectorating large amounts of brown secretions resembling NGT output. Given oxygen requirements, increased lactate, and potential for  aspiration, patient transferred to ICU 3/14 for intubation.   Past Medical History  Diagnosis Date  . Cancer    Past Surgical History  Procedure Laterality Date  . Colon surgery      . Scheduled Meds: . amiodarone  150 mg Intravenous Once  . antiseptic oral rinse  7 mL Mouth Rinse QID  . chlorhexidine  15 mL Mouth Rinse BID  . fentaNYL  50 mcg Intravenous Once  . [START ON 02/19/2015] fluconazole (DIFLUCAN) IV  400 mg Intravenous Q24H  . fluconazole (DIFLUCAN) IV  800 mg Intravenous Once  . heparin  5,000 Units Subcutaneous 3 times per day  . imipenem-cilastatin  500 mg Intravenous 4 times per day  . pantoprazole (PROTONIX) IV  40 mg Intravenous Daily  . sodium chloride  3 mL Intravenous Q12H  . vancomycin  750 mg Intravenous Q12H   Continuous Infusions: . sodium chloride 125 mL/hr at 02/18/15 1243  . amiodarone     Followed by  . amiodarone    . esmolol 25 mcg/kg/min (02/18/15 1257)  . fentaNYL infusion INTRAVENOUS    . phenylephrine (NEO-SYNEPHRINE) Adult infusion     PRN Meds:.acetaminophen **OR** acetaminophen, alum & mag hydroxide-simeth, baclofen, fentaNYL, HYDROcodone-acetaminophen, ondansetron **OR** ondansetron (ZOFRAN) IV, phenol, prochlorperazine, sodium chloride Family History  Problem Relation Age of Onset  . Cancer - Colon Mother   . Heart disease Father    History   Social History  . Marital Status: Married    Spouse Name: [name]  . Number of Children: [name]  . Years of Education: [name]   Occupational History  . Not on file.   Social History Main Topics  . Smoking status: Never Smoker   . Smokeless tobacco: Not on file  . Alcohol Use: Yes     Comment: Social drinker  . Drug Use: No  .  Sexual Activity: Yes     Comment: wife does not need   Other Topics Concern  . Not on file   Social History Narrative     REVIEW OF SYSTEMS:  Unable, distress  SUBJECTIVE: sedated   VITAL SIGNS: Temp:  [97.4 F (36.3 C)-98.6 F (37 C)] 97.5 F (36.4 C)  (03/14 0732) Pulse Rate:  [108-151] 151 (03/14 1100) Resp:  [16-31] 18 (03/14 1100) BP: (86-129)/(54-80) 109/65 mmHg (03/14 1100) SpO2:  [91 %-97 %] 93 % (03/14 1100) FiO2 (%):  [40 %] 40 % (03/14 0916) Weight:  [74.4 kg (164 lb 0.4 oz)] 74.4 kg (164 lb 0.4 oz) (03/14 0126) HEMODYNAMICS:   VENTILATOR SETTINGS: Vent Mode:  [-] PRVC FiO2 (%):  [40 %] 40 % Set Rate:  [14 bmp] 14 bmp Vt Set:  [500 mL] 500 mL PEEP:  [5 cmH20] 5 cmH20 Plateau Pressure:  [21 cmH20] 21 cmH20 INTAKE / OUTPUT:  Intake/Output Summary (Last 24 hours) at 02/18/15 1201 Last data filed at 02/18/15 0900  Gross per 24 hour  Intake 3791.25 ml  Output   3575 ml  Net 216.25 ml    PHYSICAL EXAMINATION: General:  Ill-appearing male. Sedated. ( pre with distress) Neuro:  arousable to voice, appropriate, follows commands HEENT:  NCAT. PERRL.  Cardiovascular:  S1S2, no MGR. No JVD Lungs:  Scattered rhonchi throughout.  Abdomen:  Soft. Hypoactive bowel sounds. No r/g Musculoskeletal: Intact. No edema.  Skin: Grossly intact.   LABS:  CBC  Recent Labs Lab 02/16/15 1040 02/16/15 1051 02/17/15 0500 02/17/15 2130  WBC 5.6  --  6.1 7.6  HGB 18.2* 18.7* 16.0 15.0  HCT 47.5 55.0* 44.1 41.4  PLT 185  --  158 153   Coag's No results for input(s): APTT, INR in the last 168 hours. BMET  Recent Labs Lab 02/17/15 0500 02/17/15 2130 02/18/15 1115  NA 127* 128* 133*  K 4.1 5.0 4.5  CL 95* 94* 104  CO2 _0 BUN 23 31* 30*  CREATININE 1.00 1.27 0.99  GLUCOSE 148* 108* 134*   Electrolytes  Recent Labs Lab 02/16/15 1815 02/17/15 0500 02/17/15 2130 02/18/15 1115  CALCIUM  --  8.2* 8.2* 6.9*  MG 2.4  --   --   --    Sepsis Markers  Recent Labs Lab 02/16/15 1628 02/17/15 2130  LATICACIDVEN 3.6* 4.7*   ABG  Recent Labs Lab 02/17/15 2055 02/18/15 1114  PHART 7.402 7.302*  PCO2ART 28.5* 35.4  PO2ART 63.8* 80.0   Liver Enzymes  Recent Labs Lab 02/16/15 1040  AST 31  ALT 22   ALKPHOS 70  BILITOT 1.4*  ALBUMIN 3.2*   Cardiac Enzymes No results for input(s): TROPONINI, PROBNP in the last 168 hours. Glucose No results for input(s): GLUCAP in the last 168 hours.  Imaging Dg Chest Port 1 View  02/17/2015   CLINICAL DATA:  Severe dyspnea and wheezing  EXAM: PORTABLE CHEST - 1 VIEW  COMPARISON:  02/16/2015  FINDINGS: There is a right sided Port-A-Cath with tip in the region of the cavoatrial junction. The nasogastric tube extends below the diaphragm and off the inferior edge of the image. The lungs are clear. The pulmonary vasculature is normal. There are no large effusions.  IMPRESSION: No active disease.   Electronically Signed   By: Andreas Newport M.D.   On: 02/17/2015 21:28   Dg Abd Portable 2v  02/17/2015   CLINICAL DATA:  Vomiting, orogastric tube  EXAM: PORTABLE ABDOMEN - 2 VIEW  COMPARISON:  Portable exam 1238 hr compared to 02/16/2015  FINDINGS: Small amount colonic gas.  Increased small bowel gas gaseous distention since previous exam.  No definite bowel wall thickening or free intraperitoneal air.  Tip of orogastric tube at proximal stomach.  Bones unremarkable.  Anastomotic staple line in RIGHT upper quadrant question prior RIGHT hemicolectomy.  No acute osseous findings.  IMPRESSION: Increase in small bowel distention since the previous exam, could represent ileus or developing small bowel obstruction.  Consider followup CT imaging with IV and oral contrast to assess if clinically indicated.   Electronically Signed   By: Lavonia Dana M.D.   On: 02/17/2015 13:20     ASSESSMENT / PLAN:  PULMONARY OETT 3/14 A: Acute respiratory failure in setting of aspiration pneumonitis vs HCAP Concern uncompensated met acidosis P:   Intubated, 50m/kg TV, ay eed rate increase VAP prophylaxis  See ID section Lactic acid repeat Repeat pcxr, abg to follow  CARDIOVASCULAR CVL 3/14 Implanted port right upper chest A:  Atrial fibrillation (rate 120-170s) - no p  waves with adenosine administration Hypotension P:  Esmolol infusion for rate control.  Phenylephrine if needed to maintain MAP >65 after fluid boluses No planned anticoagulation until abdo repeated, risk noted May need amio  cvp goal to 12 now on vent Cortisol then stress roids if less than 20 or in shock  tsh volume resus Echo 30 cc/kg  RENAL A:   Acute kidney injury - improving Hyponatremia - improving (etiology hypovolemia?,  Elevated lactate  Metabolic acidosis  Hypocalcemia hypovolemia P:   356mkg IV bolus NS. Replace NGT output as needed to avoid negative fluid balance  Repeat BMET Replace electrolytes as needed Repeat & trend lactate  Send serum osm   GASTROINTESTINAL A:   Metastatic colon cancer with ileus vs early obstruction ? Bowel ischemia Nausea/Vomiting  Diarrhea - cdiff neg 3/12 P:   NG tube to ILWS Stress ulcer prophylaxis - pantoprazole CT abdomen today Just had contrast, avoid repeat  HEMATOLOGIC A:   Risk for pancytopenia with recent chemotherapy P:  VTE prophylaxis - [place] Heparin  Trend CBC  INFECTIOUS A:   Possible sepsis in setting of possible HCAP  R/o abdo source, obstruction S/p chemo, immunocompromised host P:   BCx2 3/14 >>> UC 3/14 >>> Sputum 3/14 >>> cdiff neg 3/12 Abx:  Vancomycin start 3/14 >>> Zosyn start 3/14 -3/14 Fluconazole 3/14 >>> Imipenem-cilastin 3/14 >>> CVP monitoring   Transition off zosyn to imipenem STAT ct  ENDOCRINE A:   No active issues P:   Follow glucose on BMET  NEUROLOGIC A:  No active issues P:   RASS goal: -2 PAD protocol  fent   FAMILY  - Updates: Family updated 3/14 by Dr. FeTitus Mould  - Inter-disciplinary family meet or Palliative Care meeting due by:  3/21    TODAY'S SUMMARY: [age]ear old male with metastatic colon cancer transferred to ICU and intubated for hypoxia and HCAP vs aspiration pneumonitis. Aspiration likely given volume and appearance of expectorated  secretions prior to intubation and visualization of gastric contents by vocal cords upon intubation. Central line placed for CVP monitoring and access for possible vasopressors. Atrial fibrillation treated with metoprolol for rate control unsuccessful. Esmolol gtt started. Phenylephrine if needed to maintain MAP given borderline BP for calcium channel blockers and pulmonary effects of Amiodarone in patient that already has hypoxic respiratory failure. Will trend lactate and ABGs to ensure adequate hydration and perfusion. Awaiting results of chest CT of abdomen to  rule out fistula or ischemia.   STAFF NOTE: I, Merrie Roof, MD FACP have personally reviewed patient's available data, including medical history, events of note, physical examination and test results as part of my evaluation. I have discussed with resident/NP and other care providers such as pharmacist, RN and RRT. In addition, I personally evaluated patient and elicited key findings of: aspiration evident, in distress, requires emergent intubation, immunosuppressed host, change to imi, add diflucan, continued vanc, cortisol, CT required r/o obstruction as cause,. Fib rvr, esmolol, may need amio, no anticoagulation as of now until CT noted, lactic repeat needed, faily, wife updated multiple times, volue, sepsis protocol The patient is critically ill with multiple organ systems failure and requires high complexity decision making for assessment and support, frequent evaluation and titration of therapies, application of advanced monitoring technologies and extensive interpretation of multiple databases.   Critical Care Time devoted to patient care services described in this note is85 Minutes. This time reflects time of care of this signee: Merrie Roof, MD FACP. This critical care time does not reflect procedure time, or teaching time or supervisory time of PA/NP/Med student/Med Resident etc but could involve care discussion time. Rest per  NP/medical resident whose note is outlined above and that I agree with   Lavon Paganini. Titus Mould, MD, [place] Pgr: [facility] 02/18/2015 1:23 PM

## 2015-02-18 NOTE — Progress Notes (Signed)
  Echocardiogram 2D Echocardiogram has been performed.  Diamond Nickel 02/18/2015, 3:34 PM

## 2015-02-18 NOTE — Procedures (Signed)
Intubation Procedure Note Les Longmore [id number] 1951/11/09  Procedure: Intubation Indications: Respiratory insufficiency  Procedure Details Consent: Risks of procedure as well as the alternatives and risks of each were explained to the (patient/caregiver).  Consent for procedure obtained. Time Out: Verified patient identification, verified procedure, site/side was marked, verified correct patient position, special equipment/implants available, medications/allergies/relevent history reviewed, required imaging and test results available.  Performed  Maximum sterile technique was used including gloves, gown, hand hygiene and mask.  MAC and 3 glide   Evaluation Hemodynamic Status: BP stable throughout; O2 sats: stable throughout Patient's Current Condition: stable Complications: No apparent complications Patient did tolerate procedure well. Chest X-ray ordered to verify placement.  CXR: pending.   Raylene Miyamoto 02/18/2015  Bile over cords  suctioned  Lavon Paganini. Titus Mould, MD, [place] Pgr: [facility]

## 2015-02-18 NOTE — Progress Notes (Signed)
  Amiodarone Drug - Drug Interaction Consult Note  Recommendations:  No drug interactions at this time. Pt is on Lipitor PTA, monitor for myopathies if amiodarone is continued at discharge.  Amiodarone is metabolized by the cytochrome P450 system and therefore has the potential to cause many drug interactions. Amiodarone has an average plasma half-life of 50 days (range 20 to 100 days).   There is potential for drug interactions to occur several weeks or months after stopping treatment and the onset of drug interactions may be slow after initiating amiodarone.   []  Statins: Increased risk of myopathy. Simvastatin- restrict dose to 20mg  daily. Other statins: counsel patients to report any muscle pain or weakness immediately.  []  Anticoagulants: Amiodarone can increase anticoagulant effect. Consider warfarin dose reduction. Patients should be monitored closely and the dose of anticoagulant altered accordingly, remembering that amiodarone levels take several weeks to stabilize.  []  Antiepileptics: Amiodarone can increase plasma concentration of phenytoin, the dose should be reduced. Note that small changes in phenytoin dose can result in large changes in levels. Monitor patient and counsel on signs of toxicity.  []  Beta blockers: increased risk of bradycardia, AV block and myocardial depression. Sotalol - avoid concomitant use.  []   Calcium channel blockers (diltiazem and verapamil): increased risk of bradycardia, AV block and myocardial depression.  []   Cyclosporine: Amiodarone increases levels of cyclosporine. Reduced dose of cyclosporine is recommended.  []  Digoxin dose should be halved when amiodarone is started.  []  Diuretics: increased risk of cardiotoxicity if hypokalemia occurs.  []  Oral hypoglycemic agents (glyburide, glipizide, glimepiride): increased risk of hypoglycemia. Patient's glucose levels should be monitored closely when initiating amiodarone therapy.   []  Drugs that  prolong the QT interval:  Torsades de pointes risk may be increased with concurrent use - avoid if possible.  Monitor QTc, also keep magnesium/potassium WNL if concurrent therapy can't be avoided. Marland Kitchen Antibiotics: e.g. fluoroquinolones, erythromycin. . Antiarrhythmics: e.g. quinidine, procainamide, disopyramide, sotalol. . Antipsychotics: e.g. phenothiazines, haloperidol.  . Lithium, tricyclic antidepressants, and methadone.   Harvel Quale  02/18/2015  3:01 PM

## 2015-02-19 DIAGNOSIS — K567 Ileus, unspecified: Secondary | ICD-10-CM

## 2015-02-19 LAB — BASIC METABOLIC PANEL
Anion gap: 5 (ref 5–15)
BUN: 19 mg/dL (ref 6–23)
CO2: 25 mmol/L (ref 19–32)
CREATININE: 0.82 mg/dL (ref 0.50–1.35)
Calcium: 6.8 mg/dL — ABNORMAL LOW (ref 8.4–10.5)
Chloride: 103 mmol/L (ref 96–112)
Glucose, Bld: 97 mg/dL (ref 70–99)
Potassium: 3.9 mmol/L (ref 3.5–5.1)
Sodium: 133 mmol/L — ABNORMAL LOW (ref 135–145)

## 2015-02-19 LAB — CBC
HEMATOCRIT: 34.7 % — AB (ref 39.0–52.0)
Hemoglobin: 12.5 g/dL — ABNORMAL LOW (ref 13.0–17.0)
MCH: 29.1 pg (ref 26.0–34.0)
MCHC: 36 g/dL (ref 30.0–36.0)
MCV: 80.9 fL (ref 78.0–100.0)
Platelets: 120 10*3/uL — ABNORMAL LOW (ref 150–400)
RBC: 4.29 MIL/uL (ref 4.22–5.81)
RDW: 17.5 % — ABNORMAL HIGH (ref 11.5–15.5)
WBC: 6.5 10*3/uL (ref 4.0–10.5)

## 2015-02-19 LAB — URINE CULTURE
COLONY COUNT: NO GROWTH
Culture: NO GROWTH

## 2015-02-19 LAB — GLUCOSE, CAPILLARY
GLUCOSE-CAPILLARY: 67 mg/dL — AB (ref 70–99)
GLUCOSE-CAPILLARY: 119 mg/dL — AB (ref 70–99)
Glucose-Capillary: 106 mg/dL — ABNORMAL HIGH (ref 70–99)
Glucose-Capillary: 66 mg/dL — ABNORMAL LOW (ref 70–99)
Glucose-Capillary: 112 mg/dL — ABNORMAL HIGH (ref 70–99)
Glucose-Capillary: 110 mg/dL — ABNORMAL HIGH (ref 70–99)

## 2015-02-19 LAB — TROPONIN I: Troponin I: 0.04 ng/mL — ABNORMAL HIGH (ref <0.031)

## 2015-02-19 LAB — POCT I-STAT 3, ART BLOOD GAS (G3+)
Bicarbonate: 21.2 mEq/L (ref 20.0–24.0)
O2 SAT: 100 %
Patient temperature: 98.6
TCO2: 22 mmol/L (ref 0–100)
pCO2 arterial: 23.7 mmHg — ABNORMAL LOW (ref 35.0–45.0)
pH, Arterial: 7.56 — ABNORMAL HIGH (ref 7.350–7.450)
pO2, Arterial: 160 mmHg — ABNORMAL HIGH (ref 80.0–100.0)

## 2015-02-19 LAB — PROCALCITONIN: Procalcitonin: 16.16 ng/mL

## 2015-02-19 LAB — LACTIC ACID, PLASMA: Lactic Acid, Venous: 2.6 mmol/L (ref 0.5–2.0)

## 2015-02-19 LAB — CORTISOL: Cortisol, Plasma: 38.5 ug/dL

## 2015-02-19 MED ORDER — METOPROLOL TARTRATE 1 MG/ML IV SOLN
2.5000 mg | INTRAVENOUS | Status: DC | PRN
  Administered 2015-02-19 – 2015-02-20 (×5): 5 mg via INTRAVENOUS
  Filled 2015-02-19 (×6): qty 5

## 2015-02-19 MED ORDER — DEXTROSE 50 % IV SOLN
INTRAVENOUS | Status: AC
  Administered 2015-02-19: 25 mL
  Filled 2015-02-19: qty 50

## 2015-02-19 MED ORDER — TRACE MINERALS CR-CU-F-FE-I-MN-MO-SE-ZN IV SOLN
INTRAVENOUS | Status: AC
  Administered 2015-02-19: 18:00:00 via INTRAVENOUS
  Filled 2015-02-19: qty 960

## 2015-02-19 MED ORDER — FENTANYL CITRATE 0.05 MG/ML IJ SOLN
25.0000 ug | INTRAMUSCULAR | Status: DC | PRN
  Administered 2015-02-19 – 2015-02-20 (×5): 50 ug via INTRAVENOUS
  Filled 2015-02-19 (×5): qty 2

## 2015-02-19 MED ORDER — ONDANSETRON HCL 4 MG/2ML IJ SOLN
4.0000 mg | INTRAMUSCULAR | Status: DC | PRN
  Administered 2015-02-24 – 2015-02-25 (×6): 4 mg via INTRAVENOUS
  Filled 2015-02-19 (×7): qty 2

## 2015-02-19 MED ORDER — INSULIN ASPART 100 UNIT/ML ~~LOC~~ SOLN: 0.0000 [IU] | SUBCUTANEOUS | Status: DC

## 2015-02-19 MED ORDER — KCL-LACTATED RINGERS-D5W 20 MEQ/L IV SOLN
INTRAVENOUS | Status: AC
  Administered 2015-02-19 (×2): via INTRAVENOUS
  Filled 2015-02-19 (×2): qty 1000

## 2015-02-19 MED ORDER — METOCLOPRAMIDE HCL 5 MG/ML IJ SOLN
5.0000 mg | Freq: Four times a day (QID) | INTRAMUSCULAR | Status: DC
  Administered 2015-02-19 – 2015-02-22 (×12): 5 mg via INTRAVENOUS
  Filled 2015-02-19 (×16): qty 1

## 2015-02-19 MED ORDER — FAT EMULSION 20 % IV EMUL
240.0000 mL | INTRAVENOUS | Status: AC
  Administered 2015-02-19: 240 mL via INTRAVENOUS
  Filled 2015-02-19: qty 250

## 2015-02-19 MED ORDER — PIPERACILLIN-TAZOBACTAM 3.375 G IVPB
3.3750 g | Freq: Three times a day (TID) | INTRAVENOUS | Status: DC
  Administered 2015-02-19 – 2015-02-22 (×9): 3.375 g via INTRAVENOUS
  Filled 2015-02-19 (×12): qty 50

## 2015-02-19 MED ORDER — ENOXAPARIN SODIUM 40 MG/0.4ML ~~LOC~~ SOLN
40.0000 mg | SUBCUTANEOUS | Status: DC
  Administered 2015-02-19 – 2015-02-22 (×4): 40 mg via SUBCUTANEOUS
  Filled 2015-02-19 (×4): qty 0.4

## 2015-02-19 NOTE — Progress Notes (Signed)
eLink Physician-Brief Progress Note Patient Name: Benjamin Blake DOB: May 20, 1951 MRN: [id number]   Date of Service  02/19/2015  HPI/Events of Note  Called d/t 4 episodes of watery diarrhea since 5 PM. Nurse requests Flexiseal.  eICU Interventions  Will order: 1. Stool for C. Difficile Toxin 2. Contact Isolation 3. Insert Flexiseal     Intervention Category Minor Interventions: Routine modifications to care plan (e.g. PRN medications for pain, fever)  Verlene Glantz [place] 02/19/2015, 9:45 PM

## 2015-02-19 NOTE — Progress Notes (Signed)
E-link MD notified of second hypoglycemic episode and treatment. Advised nursing to check CBG q1h until TPN is hung. IV team consult placed to begin TPN ASAP. Nursing to continue to monitor pt.

## 2015-02-19 NOTE — Procedures (Signed)
Extubation Procedure Note  Patient Details:   Name: Benjamin Blake DOB: 1951/05/17 MRN: [id number]   Airway Documentation:     Evaluation  O2 sats: stable throughout Complications: No apparent complications Patient did tolerate procedure well. Bilateral Breath Sounds: Clear, Diminished Suctioning: Oral, Airway Yes  BBS coarse and decreased in the bases, 4l/min [place] IS instructed 770ml  Revonda Standard 02/19/2015, 10:10 AM

## 2015-02-19 NOTE — Progress Notes (Signed)
ANTIBIOTIC CONSULT NOTE - INITIAL  Pharmacy Consult for zosyn Indication: pneumonia  No Known Allergies  Patient Measurements: Height: 5\' 5"  (165.1 cm) Weight: 164 lb 0.4 oz (74.4 kg) IBW/kg (Calculated) : 61.5 Adjusted Body Weight:   Vital Signs: Temp: 99.2 F (37.3 C) (03/15 0730) Temp Source: Oral (03/15 0730) BP: 101/64 mmHg (03/15 0900) Pulse Rate: 113 (03/15 0900) Intake/Output from previous day: 03/14 0701 - 03/15 0700 In: 4226.5 [I.V.:3746.5; IV Piggyback:450] Out: 1660 [YTKZS:0109; Emesis/NG output:2100] Intake/Output from this shift: Total I/O In: 568.4 [I.V.:298.4; Other:20; IV Piggyback:250] Out: 95 [Urine:95]  Labs:  Recent Labs  02/17/15 2130 02/18/15 1115 02/18/15 1330 02/19/15 0452  WBC 7.6  --  9.0 6.5  HGB 15.0  --  12.5* 12.5*  PLT 153  --  138* 120*  CREATININE 1.27 0.99  --  0.82   Medical History: Past Medical History  Diagnosis Date  . Cancer     Medications:  See EMR  Assessment: 64 yo male with metastatic colon cancer on Xeloda prior to admission. Changing to zosyn for possible aspiration pneumonia in an immunocompromised host. WBC wnl, lactic acid mildly elevated at admission, afebrile.   3/14 vanc > 3/15 3/14 primaxin > 3/15 3/14 fluconazole > 3/15 3/15 zosyn >  3/14 blood cx: ngtd 3/14 urine cx: ngtd 3/14 resp cx: few gpc, few yeast 3/14 mrsa: neg 3/12 cdiff: neg  Goal of Therapy:  Resolution of infection  Plan:  Zosyn 3.375 g IV q8h  Monitor renal fx, cultures, duration of therapy   Hughes Better, PharmD, BCPS Clinical Pharmacist Pager: [phone] 02/19/2015 10:22 AM

## 2015-02-19 NOTE — Progress Notes (Addendum)
Hypoglycemic Event  CBG: 67   Treatment: 53mls d50  Symptoms: None  Follow-up CBG: Time:1300   CBG Result: 106  Possible Reasons for Event: Inadequate meal intake  Comments/MD notified:per hypoglycemia protocol. Dr Alva Garnet to be notified    Benjamin Blake lashell  Remember to initiate Hypoglycemia Order Set & complete

## 2015-02-19 NOTE — Progress Notes (Addendum)
PULMONARY / CRITICAL CARE MEDICINE   Name: Benjamin Blake MRN: [id number] DOB: 12-21-1950    ADMISSION DATE:  02/16/2015 CONSULTATION DATE:  02/18/2015  REFERRING MD : edp  CHIEF COMPLAINT:  hypoxia  INITIAL PRESENTATION:  6 M with metastatic colon cancer (undergoing chemotherapy) admitted 3/12 by [facility] for intractable vomiting and diarrhea. Developed respiratory distress 3/14 due to apparent aspiration. Transferred to ICU, PCCM consult, intubated  STUDIES/SIGNIFICANT EVENTS:  3/14 CT chest:  Negative for pulmonary embolism. Multifocal airspace opacities in both lungs, possibly due to infectious infiltrates. Pulmonary hemorrhages could also have this appearance.  Low-attenuation liver lesions, not simple cysts. These could represent metastases. 3/14 CTAP: Postsurgical changes in the right upper quadrant consistent with the small bowel to transverse colon anastomosis. The anastomosis appears patent although some mild inflammatory changes are noted within the mesenteric and small bowel dilatation is seen. It is uncertain whether this represents a partial small bowel obstruction or ileus  3/14 transient AFRVR. Converted with amiodarone 3/15 passed SBT. Cognition intact. Extubated and tolerating. TPN initiated. Motility agent initiated  INDWELLING DEVICES:: ETT 3/14 >> 3/15 R IJ CVL 3/14 >>   MICRO DATA: MRSA PCR 3/12 >> NEG C diff 3/12 >> NEG Blood 3/14 >>  Resp 3/14 >>   ANTIMICROBIALS:  Vanc 3/14 >> 3/15 Imipenem 3/14 >> 3/15 Pip-tazo 3/15 >>   SUBJECTIVE:    VITAL SIGNS: Temp:  [97 F (36.1 C)-99.4 F (37.4 C)] 97 F (36.1 C) (03/15 1225) Pulse Rate:  [39-127] 122 (03/15 1200) Resp:  [18-32] 22 (03/15 1200) BP: (90-110)/(47-71) 104/63 mmHg (03/15 1200) SpO2:  [91 %-100 %] 95 % (03/15 1200) FiO2 (%):  [40 %] 40 % (03/15 0730) HEMODYNAMICS: CVP:  [4 mmHg-15 mmHg] 9 mmHg VENTILATOR SETTINGS: Vent Mode:  [-] PRVC FiO2 (%):  [40 %] 40 % Set Rate:  [20 bmp] 20 bmp Vt Set:   [450 mL-500 mL] 450 mL PEEP:  [5 cmH20] 5 cmH20 Plateau Pressure:  [20 cmH20] 20 cmH20 INTAKE / OUTPUT:  Intake/Output Summary (Last 24 hours) at 02/19/15 1314 Last data filed at 02/19/15 1200  Gross per 24 hour  Intake 4759.18 ml  Output   3085 ml  Net 1674.18 ml    PHYSICAL EXAMINATION: General:  RASS 0. + F/C Neuro: no focal deficits HEENT:  NCAT Cardiovascular: tachy, reg, no M Lungs:  Scattered rhonchi throughout.  Abdomen: Soft. NT, diminished BS Musculoskeletal: Intact. No edema.  Skin: Grossly intact.   LABS: I have reviewed all of today's lab results. Relevant abnormalities are discussed in the A/P section  CXR: Bilateral lung infiltrates, greatest in the right midlung  ASSESSMENT / PLAN:  PULMONARY A: Acute respiratory failure Aspiration PNA P:   Monitor in ICU post extubation Supp O2 as needed  CARDIOVASCULAR A:  PAF > sinus tach Hypotension, resolved P:  DC amiodarone Low dose PRN metoprolol to maintain HR < 115/min Monitor BP and rhythm  RENAL A:   AKI, resolved Hyponatremia, mild Metabolic acidosis, resolved  Hypovolemia, resolved P:   Monitor BMET intermittently Monitor I/Os Correct electrolytes as indicated   GASTROINTESTINAL A:   Metastatic colon cancer Ileus, doubt SBO Doubt bowel ischemia Intractable N/V Diarrhea - cdiff neg 3/12 P:   Cont NGT to low suction post ext TPN initiated 3/15 Begin metoclopramide 3/15  Discussed with Dr Clelia Croft, his oncologist @ [facility] 3/15  Further chemo decisions to be made after discharge  Please ensure that a DC summary is directed to Dr Reynaldo Minium @ [facility]  HEMATOLOGIC A:   Mild TCP P:  DVT px: enoxaparin Monitor CBC intermittently Transfuse per usual ICU guidelines  INFECTIOUS A:   Asp PNA Severe sepsis Immunocompromised host P: Micro and abx as above  ENDOCRINE A:   No active issues P:   Will need SSI once TPN initiated  NEUROLOGIC A:  Abd pain ICU associated  anxiety P:   RASS goal: 0 Low dose PRN fentanyl   FAMILY: Pt and family updated @ bedside   CCM time:  43 mins  Merton Border, MD ; [facility] service Mobile [phone].  After 5:30 PM or weekends, call [phone]

## 2015-02-19 NOTE — Progress Notes (Signed)
INITIAL NUTRITION ASSESSMENT  DOCUMENTATION CODES Per approved criteria  -Not Applicable   INTERVENTION: -TPN per pharmacy  NUTRITION DIAGNOSIS: Inadequate oral intake related to inability to eat as evidenced by NPO status.   Goal: Pt to meet >/= 90% of estimated needs  Monitor:  TPN tolerance/adequacy, weight trends, labs  Reason for Assessment: Consult for TNA/TPN  64 y.o. male  Admitting Dx: <principal problem not specified>  ASSESSMENT: Pt with metastatic colon cancer, followed by [facility]. Presented with intractable vomiting and diarrhea, episodes of coffee ground emesis.   Intubated 3/14 Extubated 3/15   Spoke with pt wife and daughter, reports he has note eaten since last Tuesday (x 6 days) due to nausea and vomiting.  According to wife pt UBW around 160#.  Pt 164# on admission.  Pt wife reports before vomiting last Tuesday, appetite was normal.    TPN being initiated today. Pt may be at risk for refeeding (no PO intake x 7 days), Mg and Phos orders in.  Nutrition Focused Physical Exam: No signs of fat or muscle wasting.    Height: Ht Readings from Last 1 Encounters:  02/18/15 5\' 5"  (1.651 m)    Weight: Wt Readings from Last 1 Encounters:  02/18/15 164 lb 0.4 oz (74.4 kg)    Ideal Body Weight: 142 lbs (64.5 kg)  % Ideal Body Weight: 115%  Wt Readings from Last 10 Encounters:  02/18/15 164 lb 0.4 oz (74.4 kg)    Usual Body Weight: 160 lbs  % Usual Body Weight: 103%  BMI:  Body mass index is 27.29 kg/(m^2).  Estimated Nutritional Needs: Kcal: 1900-2200 kcal Protein: 90-110 g protein Fluid: >/= 2.0 L  Skin: Appropriate, dry, intact  Diet Order: TPN (CLINIMIX-E) Adult; NPO  EDUCATION NEEDS: -No education needs identified at this time   Intake/Output Summary (Last 24 hours) at 02/19/15 1135 Last data filed at 02/19/15 1000  Gross per 24 hour  Intake 4794.88 ml  Output   3040 ml  Net 1754.88 ml    Last BM: 3/14   Labs:   Recent  Labs Lab 02/16/15 1815  02/17/15 2130 02/18/15 1115 02/19/15 0452  NA  --   < > 128* 133* 133*  K  --   < > 5.0 4.5 3.9  CL  --   < > 94* 104 103  CO2  --   < > 21 22 25   BUN  --   < > 31* 30* 19  CREATININE  --   < > 1.27 0.99 0.82  CALCIUM  --   < > 8.2* 6.9* 6.8*  MG 2.4  --   --   --   --   GLUCOSE  --   < > 108* 134* 97  < > = values in this interval not displayed.  CBG (last 3)   Recent Labs  02/18/15 1214  GLUCAP 148*    Scheduled Meds: . enoxaparin (LOVENOX) injection  40 mg Subcutaneous Q24H  . insulin aspart  0-9 Units Subcutaneous 6 times per day  . piperacillin-tazobactam (ZOSYN)  IV  3.375 g Intravenous 3 times per day  . sodium chloride  3 mL Intravenous Q12H    Continuous Infusions: . dextrose 5% lactated ringers with KCl 20 mEq/L 75 mL/hr at 02/19/15 1030  . Marland KitchenTPN (CLINIMIX-E) Adult     And  . fat emulsion      Past Medical History  Diagnosis Date  . Cancer     Past Surgical History  Procedure Laterality  Date  . Colon surgery      Elmer Picker MS Dietetic Intern Pager Number [phone]   I agree with student dietitian note; appropriate revisions have been made.  Molli Barrows, RD, LDN, [place] Pager# [phone] After Hours Pager# [phone]

## 2015-02-19 NOTE — Progress Notes (Signed)
Hypoglycemic Event  CBG: 66  Treatment: D50 IV 25 mL  Symptoms: Shaky and None  Follow-up CBG: Time:1700 CBG Result: 119  Possible Reasons for Event: Inadequate meal intake  Comments/MD notified: E-link MD notified    Benjamin Blake lashell  Remember to initiate Hypoglycemia Order Set & complete

## 2015-02-19 NOTE — Progress Notes (Signed)
PARENTERAL NUTRITION CONSULT NOTE - INITIAL  Pharmacy Consult for TPN Indication: intolerance to enteral nutrition, ileus vs SBO  No Known Allergies  Patient Measurements: Height: 5\' 5"  (165.1 cm) Weight: 164 lb 0.4 oz (74.4 kg) IBW/kg (Calculated) : 61.5  Vital Signs: Temp: 99.2 F (37.3 C) (03/15 0730) Temp Source: Oral (03/15 0730) BP: 101/64 mmHg (03/15 0900) Pulse Rate: 113 (03/15 0900) Intake/Output from previous day: 03/14 0701 - 03/15 0700 In: 4226.5 [I.V.:3746.5; IV Piggyback:450] Out: 7425 [ZDGLO:7564; Emesis/NG output:2100] Intake/Output from this shift: Total I/O In: 568.4 [I.V.:298.4; Other:20; IV Piggyback:250] Out: 95 [Urine:95]  Labs:  Recent Labs  02/17/15 2130 02/18/15 1330 02/19/15 0452  WBC 7.6 9.0 6.5  HGB 15.0 12.5* 12.5*  HCT 41.4 35.1* 34.7*  PLT 153 138* 120*     Recent Labs  02/16/15 1040  02/16/15 1815  02/17/15 2130 02/18/15 1115 02/19/15 0452  NA 127*  < >  --   < > 128* 133* 133*  K 3.3*  < >  --   < > 5.0 4.5 3.9  CL 88*  < >  --   < > 94* 104 103  CO2 22  --   --   < > 21 22 25   GLUCOSE 180*  < >  --   < > 108* 134* 97  BUN 47*  < >  --   < > 31* 30* 19  CREATININE 1.64*  < >  --   < > 1.27 0.99 0.82  CALCIUM 9.2  --   --   < > 8.2* 6.9* 6.8*  MG  --   --  2.4  --   --   --   --   PROT 6.8  --   --   --   --   --   --   ALBUMIN 3.2*  --   --   --   --   --   --   AST 31  --   --   --   --   --   --   ALT 22  --   --   --   --   --   --   ALKPHOS 70  --   --   --   --   --   --   BILITOT 1.4*  --   --   --   --   --   --   < > = values in this interval not displayed. Estimated Creatinine Clearance: 89.2 mL/min (by C-G formula based on Cr of 0.82).    Recent Labs  02/18/15 1214  GLUCAP 148*   Medical History: Past Medical History  Diagnosis Date  . Cancer    Insulin Requirements in the past 24 hours:  No insulin ordered  Current Nutrition:  NPO  Assessment: 89 yom with metastatic colon cancer undergoing  chemotherapy, admitted 3/12 with intractable vomiting and diarrhea. CT abdomen shows possible ileus vs early obstruction. Continued vomiting during admission.   GI: Colon cancer with liver + lung mets (Xeloda PTA) - SBO vs ileus - s/p R hemicolectomy in 12/15.  Endo: TSH WNL, CBGs good Lytes: Na 133, K 3.9 (goal 4) >> started D5LR w/ 27meq KCl at 36ml/hr (to stop when TPN starts) Renal: Scr 0.82, UOP 0.10ml/kg/hr Pulm: Extubated 3/15 - CT negative for PE Cards: BP 101/64, HR 113, EF 45-50% - no meds Hepatobil: LFTs WNL, Tbili slightly elevated 1.4 - thrombocytopenia Neuro: Extubated this AM ID:  Narrowed to zosyn for aspiration PNA - Tmax 99.4, WBC WNL, cultures NGTD- lactic acid trending down Best Practices: lovenox TPN Access: CVC L IJ TPN day#: 0 (3/15>> )  Nutritional Goals:  1800-2200 kCal, 90-110 grams of protein per day  Plan:  - Clinimix E5/15 at 77mL/hr - Lipids 20% at 89ml/hr - Start SSI to assess glucose control while on TPN - F/u RD recommendations  - TPN labs  Yecheskel Kurek, Rande Lawman 02/19/2015,10:07 AM

## 2015-02-20 ENCOUNTER — Inpatient Hospital Stay (HOSPITAL_COMMUNITY): Payer: BLUE CROSS/BLUE SHIELD

## 2015-02-20 DIAGNOSIS — E46 Unspecified protein-calorie malnutrition: Secondary | ICD-10-CM

## 2015-02-20 LAB — DIFFERENTIAL
BASOS ABS: 0 10*3/uL (ref 0.0–0.1)
BASOS PCT: 0 % (ref 0–1)
Band Neutrophils: 0 % (ref 0–10)
Blasts: 0 %
EOS ABS: 0.2 10*3/uL (ref 0.0–0.7)
EOS PCT: 3 % (ref 0–5)
LYMPHS ABS: 0.4 10*3/uL — AB (ref 0.7–4.0)
Lymphocytes Relative: 7 % — ABNORMAL LOW (ref 12–46)
MYELOCYTES: 0 %
Metamyelocytes Relative: 0 %
Monocytes Absolute: 0.8 10*3/uL (ref 0.1–1.0)
Monocytes Relative: 16 % — ABNORMAL HIGH (ref 3–12)
NEUTROS ABS: 3.6 10*3/uL (ref 1.7–7.7)
NRBC: 0 /100{WBCs}
Neutrophils Relative %: 74 % (ref 43–77)
Promyelocytes Absolute: 0 %

## 2015-02-20 LAB — COMPREHENSIVE METABOLIC PANEL
ALBUMIN: 1.5 g/dL — AB (ref 3.5–5.2)
ALT: 35 U/L (ref 0–53)
AST: 71 U/L — AB (ref 0–37)
Alkaline Phosphatase: 56 U/L (ref 39–117)
Anion gap: 4 — ABNORMAL LOW (ref 5–15)
BILIRUBIN TOTAL: 1.1 mg/dL (ref 0.3–1.2)
BUN: 13 mg/dL (ref 6–23)
CHLORIDE: 103 mmol/L (ref 96–112)
CO2: 27 mmol/L (ref 19–32)
Calcium: 7.1 mg/dL — ABNORMAL LOW (ref 8.4–10.5)
Creatinine, Ser: 0.95 mg/dL (ref 0.50–1.35)
GFR calc Af Amer: >90 mL/min (ref >90)
GFR calc non Af Amer: 88 mL/min — ABNORMAL LOW (ref >90)
Glucose, Bld: 129 mg/dL — ABNORMAL HIGH (ref 70–99)
Potassium: 3.3 mmol/L — ABNORMAL LOW (ref 3.5–5.1)
SODIUM: 134 mmol/L — AB (ref 135–145)
TOTAL PROTEIN: 4.2 g/dL — AB (ref 6.0–8.3)

## 2015-02-20 LAB — CLOSTRIDIUM DIFFICILE BY PCR: Toxigenic C. Difficile by PCR: NEGATIVE

## 2015-02-20 LAB — CBC
HCT: 32.8 % — ABNORMAL LOW (ref 39.0–52.0)
Hemoglobin: 11.5 g/dL — ABNORMAL LOW (ref 13.0–17.0)
MCH: 29 pg (ref 26.0–34.0)
MCHC: 35.1 g/dL (ref 30.0–36.0)
MCV: 82.6 fL (ref 78.0–100.0)
PLATELETS: 106 10*3/uL — AB (ref 150–400)
RBC: 3.97 MIL/uL — ABNORMAL LOW (ref 4.22–5.81)
RDW: 17.9 % — ABNORMAL HIGH (ref 11.5–15.5)
WBC: 5 10*3/uL (ref 4.0–10.5)

## 2015-02-20 LAB — GLUCOSE, CAPILLARY
GLUCOSE-CAPILLARY: 95 mg/dL (ref 70–99)
Glucose-Capillary: 102 mg/dL — ABNORMAL HIGH (ref 70–99)
Glucose-Capillary: 114 mg/dL — ABNORMAL HIGH (ref 70–99)
Glucose-Capillary: 116 mg/dL — ABNORMAL HIGH (ref 70–99)
Glucose-Capillary: 116 mg/dL — ABNORMAL HIGH (ref 70–99)
Glucose-Capillary: 97 mg/dL (ref 70–99)

## 2015-02-20 LAB — MAGNESIUM: MAGNESIUM: 1.8 mg/dL (ref 1.5–2.5)

## 2015-02-20 LAB — TRIGLYCERIDES: Triglycerides: 262 mg/dL — ABNORMAL HIGH (ref <150)

## 2015-02-20 LAB — PROCALCITONIN: PROCALCITONIN: 10.33 ng/mL

## 2015-02-20 LAB — PREALBUMIN: Prealbumin: 2.8 mg/dL — ABNORMAL LOW (ref 17.0–34.0)

## 2015-02-20 LAB — TROPONIN I: Troponin I: 0.04 ng/mL — ABNORMAL HIGH (ref <0.031)

## 2015-02-20 LAB — PHOSPHORUS: Phosphorus: 1.2 mg/dL — ABNORMAL LOW (ref 2.3–4.6)

## 2015-02-20 MED ORDER — FAT EMULSION 20 % IV EMUL
240.0000 mL | INTRAVENOUS | Status: AC
  Administered 2015-02-20: 240 mL via INTRAVENOUS
  Filled 2015-02-20: qty 250

## 2015-02-20 MED ORDER — CLINIMIX E/DEXTROSE (5/15) 5 % IV SOLN
INTRAVENOUS | Status: AC
  Administered 2015-02-20: 18:00:00 via INTRAVENOUS
  Filled 2015-02-20: qty 960

## 2015-02-20 MED ORDER — SODIUM PHOSPHATE 3 MMOLE/ML IV SOLN
30.0000 mmol | Freq: Once | INTRAVENOUS | Status: AC
  Administered 2015-02-20: 30 mmol via INTRAVENOUS
  Filled 2015-02-20: qty 10

## 2015-02-20 MED ORDER — POTASSIUM CHLORIDE 10 MEQ/50ML IV SOLN
10.0000 meq | INTRAVENOUS | Status: AC
  Administered 2015-02-20 (×2): 10 meq via INTRAVENOUS
  Filled 2015-02-20 (×2): qty 50

## 2015-02-20 MED ORDER — CETYLPYRIDINIUM CHLORIDE 0.05 % MT LIQD
7.0000 mL | Freq: Two times a day (BID) | OROMUCOSAL | Status: DC
  Administered 2015-02-20 – 2015-02-27 (×13): 7 mL via OROMUCOSAL

## 2015-02-20 MED ORDER — CHLORHEXIDINE GLUCONATE 0.12 % MT SOLN
15.0000 mL | Freq: Two times a day (BID) | OROMUCOSAL | Status: DC
  Administered 2015-02-20 – 2015-02-27 (×14): 15 mL via OROMUCOSAL
  Filled 2015-02-20 (×16): qty 15

## 2015-02-20 MED ORDER — FUROSEMIDE 10 MG/ML IJ SOLN
20.0000 mg | Freq: Once | INTRAMUSCULAR | Status: AC
  Administered 2015-02-20: 20 mg via INTRAVENOUS
  Filled 2015-02-20: qty 2

## 2015-02-20 MED ORDER — MAGNESIUM SULFATE 2 GM/50ML IV SOLN
2.0000 g | Freq: Once | INTRAVENOUS | Status: AC
  Administered 2015-02-20: 2 g via INTRAVENOUS
  Filled 2015-02-20 (×2): qty 50

## 2015-02-20 MED ORDER — SODIUM CHLORIDE 0.9 % IV SOLN: INTRAVENOUS | Status: DC

## 2015-02-20 MED ORDER — METOPROLOL TARTRATE 25 MG/10 ML ORAL SUSPENSION
12.5000 mg | Freq: Two times a day (BID) | ORAL | Status: DC
  Administered 2015-02-20 (×2): 12.5 mg
  Filled 2015-02-20 (×4): qty 5

## 2015-02-20 NOTE — Progress Notes (Signed)
RN calling elink  Slow increase in o2 need over night shift from 2L to [facility] - extubated < 24h ago Coughing up yellow sputum Denies dyspnea EF 45% on echo 2 day ago   Plan Stat cxr Lasix 20mg  IV x1 Sputum culture Start bipap   Dr. Brand Males, M.D., [facility].C.P Pulmonary and Critical Care Medicine Staff Physician [place] [facility] Pager: [phone], If no answer or between  15:00h - 7:00h: call [phone]  02/20/2015 2:48 AM

## 2015-02-20 NOTE — Progress Notes (Signed)
Pt taken off BiPAP. SATS 98% placed on 4lt [place]. Pt in no distress.

## 2015-02-20 NOTE — Progress Notes (Signed)
PARENTERAL NUTRITION CONSULT NOTE - Follow-up  Pharmacy Consult for TPN Indication: intolerance to enteral nutrition, ileus vs SBO  No Known Allergies  Patient Measurements: Height: 5\' 5"  (165.1 cm) Weight: 164 lb 0.4 oz (74.4 kg) IBW/kg (Calculated) : 61.5  Vital Signs: Temp: 99.2 F (37.3 C) (03/16 0735) Temp Source: Oral (03/16 0735) BP: 108/59 mmHg (03/16 0800) Pulse Rate: 127 (03/16 0800) Intake/Output from previous day: 03/15 0701 - 03/16 0700 In: 2197.1 [I.V.:860.9; IV Piggyback:560.5; TPN:655.7] Out: 5050 [Urine:2975; Emesis/NG output:475; XENMM:7680] Intake/Output from this shift:    Labs:  Recent Labs  02/18/15 1330 02/19/15 0452 02/20/15 0300  WBC 9.0 6.5 5.0  HGB 12.5* 12.5* 11.5*  HCT 35.1* 34.7* 32.8*  PLT 138* 120* 106*     Recent Labs  02/18/15 1115 02/19/15 0452 02/20/15 0300  NA 133* 133* 134*  K 4.5 3.9 3.3*  CL 104 103 103  CO2 22 25 27   GLUCOSE 134* 97 129*  BUN 30* 19 13  CREATININE 0.99 0.82 0.95  CALCIUM 6.9* 6.8* 7.1*  MG  --   --  1.8  PHOS  --   --  1.2*  PROT  --   --  4.2*  ALBUMIN  --   --  1.5*  AST  --   --  71*  ALT  --   --  35  ALKPHOS  --   --  56  BILITOT  --   --  1.1  TRIG  --   --  262*   Estimated Creatinine Clearance: 77 mL/min (by C-G formula based on Cr of 0.95).    Recent Labs  02/20/15 0007 02/20/15 0444 02/20/15 0733  GLUCAP 102* 116* 116*   Insulin Requirements in the past 24 hours:  0 units SSI  Current Nutrition:  Clinimix E5/15 at 26ml/hr + lipids 20% at 82ml/hr - provides 48gm protein/day (53% goal) and 1162 Kcal/day (61% goal)  Assessment: 42 yom with metastatic colon cancer undergoing chemotherapy, admitted 3/12 with intractable vomiting and diarrhea. CT abdomen shows possible ileus vs early obstruction. Per patients family, he had not eaten x 6 days prior to admission due to N/V.    GI: Colon cancer with liver + lung mets (Xeloda PTA) - SBO vs ileus - s/p R hemicolectomy in 12/15.  Pt may be at risk for refeeding d/t no PO intake x 7 days prior to TPN initiation. Motility agent (metoclopramide) added.  Endo: TSH WNL, CBGs good on SSI - continue insulin until TPN is at goal Lytes: Na 134, K 3.3 (goal 4), Mg 1.8 (goal 2), Phos 1.2 >> MD supplemented 2 runs of KCl, 2gm Mg and 67mmol of NaPhos already this AM - likely some refeeding as pt has not been eating for a week prior to TPN initiation Renal: Scr 0.95, UOP 1.21ml/kg/hr Pulm: Extubated 3/15 but require bipap overnight - CT negative for PE Cards: BP 127/89, HR 135, EF 45-50% - no meds except prn metoprolol IV - troponin mildly elevated Hepatobil: AST mildly elevated - thrombocytopenia (plts trending down) - triglycerides are elevated at 262  Neuro: Agitated this AM - A&O, GCS 14, RASS 1 (goal -1) ID: Narrowed to zosyn for aspiration PNA - Tmax 99.4, WBC WNL, cultures NGTD- lactic acid + PCT trending down Best Practices: lovenox, MC TPN Access: CVC L IJ TPN day#: 1 (3/15>> )  Nutritional Goals: (per RD 3/15) 1900-2200 kCal/day 90-110 grams of protein/day  Plan:  - Continue Clinimix E5/15 at 100mL/hr - will not advance  until electrolytes are better controlled d/t possible refeeding syndrome - Continue Lipids 20% at 56ml/hr - Continue SSI at least until TPN is at goal rate to assess glucose control with full nutrition - F/u AM labs   Benjamin Blake, Benjamin Blake 02/20/2015,9:01 AM

## 2015-02-20 NOTE — Progress Notes (Signed)
Pt extremely agitated after starting Bi-Pap, pt continued to pull hose from mask, stating, "I don't want that on." Pt alert and oriented x4. Pt requesting to cough and be suctioned. Sara from RT at bedside, Bi-pap removed and placed on 3 L via [place]. Pt with a constant cough of yellow sputum. Pt able top suction sputum on his own, pt unable to fully cough sputum up for a sample. Pt's lungs are rhonchi and diminished. Pt sats are 97% on 3L Pt also requested for this RN to call his spouse and daughter and have them come to hospital this am. I spoke with Neoma Laming, pt's spouse. She is coming up to visit and will call his daughter.   Babs Bertin RN

## 2015-02-20 NOTE — Progress Notes (Signed)
Pt sleeping, O2 sats decreased to 86% on 2L [place]. Pt repositioned, O2 increased to 6 L via [place], assisted with coughing and suctioning with no change. Dr. Chase Caller called for further assistance, order for CXR and Bi-pap placed and followed through.  Babs Bertin RN

## 2015-02-20 NOTE — Progress Notes (Addendum)
PULMONARY / CRITICAL CARE MEDICINE   Name: Benjamin Blake MRN: [id number] DOB: 1951-08-07    ADMISSION DATE:  02/16/2015 CONSULTATION DATE:  02/18/2015  REFERRING MD : edp  CHIEF COMPLAINT:  hypoxia  INITIAL PRESENTATION:  52 M with metastatic colon cancer (undergoing chemotherapy) admitted 3/12 by [facility] for intractable vomiting and diarrhea. Developed respiratory distress 3/14 due to apparent aspiration. Transferred to ICU, PCCM consult, intubated  STUDIES/SIGNIFICANT EVENTS:  3/14 CT chest:  Negative for pulmonary embolism. Multifocal airspace opacities in both lungs, possibly due to infectious infiltrates. Pulmonary hemorrhages could also have this appearance.  Low-attenuation liver lesions, not simple cysts. These could represent metastases. 3/14 CTAP: Postsurgical changes in the right upper quadrant consistent with the small bowel to transverse colon anastomosis. The anastomosis appears patent although some mild inflammatory changes are noted within the mesenteric and small bowel dilatation is seen. It is uncertain whether this represents a partial small bowel obstruction or ileus  3/14 transient AFRVR. Converted with amiodarone 3/15 passed SBT. Cognition intact. Extubated and tolerating. TPN initiated. Motility agent initiated  INDWELLING DEVICES:: ETT 3/14 >> 3/15 R IJ CVL 3/14 >>   MICRO DATA: MRSA PCR 3/12 >> NEG C diff 3/12 >> NEG CDiff 3/15>>>NEG Blood 3/14 >>  Resp 3/14 >>  Urine 3/14>>> neg   ANTIMICROBIALS:  Vanc 3/14 >> 3/15 Imipenem 3/14 >> 3/15 Pip-tazo 3/15 >>   SUBJECTIVE:  Extubated yesterday.  Denies SOB.  Feeling a little better.  Looks good up in chair. Still with diarrhea, CDiff neg.    VITAL SIGNS: Temp:  [97 F (36.1 C)-99.9 F (37.7 C)] 99.2 F (37.3 C) (03/16 0735) Pulse Rate:  [100-135] 135 (03/16 0900) Resp:  [17-33] 22 (03/16 0900) BP: (90-127)/(56-89) 127/89 mmHg (03/16 0900) SpO2:  [91 %-97 %] 92 % (03/16 0900) FiO2 (%):  [40 %] 40 % (03/16  0400) HEMODYNAMICS:   VENTILATOR SETTINGS: Vent Mode:  [-] BIPAP FiO2 (%):  [40 %] 40 % Set Rate:  [15 bmp] 15 bmp PEEP:  [6 cmH20] 6 cmH20 INTAKE / OUTPUT:  Intake/Output Summary (Last 24 hours) at 02/20/15 0954 Last data filed at 02/20/15 0900  Gross per 24 hour  Intake 1728.67 ml  Output   5080 ml  Net -3351.33 ml    PHYSICAL EXAMINATION: General:  NAD sitting OOB in chair  Neuro: no focal deficits, mild intermittent confusion, MAE, gen weakness  HEENT:  NCAT Cardiovascular: tachy, reg, no M Lungs:  resps even non labored on [place], Scattered rhonchi throughout.  Abdomen: Soft. NT, diminished BS Musculoskeletal: Intact. No edema.  Skin: Grossly intact.   LABS: I have reviewed all of today's lab results. Relevant abnormalities are discussed in the A/P section.  CXR: RUL consolidation slightly improved   ASSESSMENT / PLAN:  PULMONARY A: Acute respiratory failure Aspiration PNA P:   Intermittent f/u CXR  Supp O2 as needed pulm hygiene  Mobilize   CARDIOVASCULAR A:  PAF > sinus tach Hypotension, resolved P:  Will add low dose scheduled PO lopressor Monitor BP and rhythm  RENAL A:   AKI, resolved Hyponatremia, mild Metabolic acidosis, resolved  Hypovolemia, resolved P:   Monitor BMET intermittently Monitor I/Os Correct electrolytes as indicated   GASTROINTESTINAL A:   Metastatic colon cancer Ileus, doubt SBO or bowel ischemia Intractable N/V Diarrhea - cdiff neg 3/12 and 3/16 Protein calorie malnutrition - very poor PO intake X > 1 week P:   Clamp NG tube  Start clear liquids as tol  TPN initiated 3/15 -  cont for now, d/c if tolerates advancement of diet  Cont metoclopramide   Discussed with Dr Clelia Croft, his oncologist @ [facility] 3/15  Further chemo decisions to be made after discharge  Please ensure that a DC summary is directed to Dr Reynaldo Minium @ [facility] A:   Mild TCP P:  DVT px: enoxaparin Monitor CBC  intermittently Transfuse per usual ICU guidelines  INFECTIOUS A:   Asp PNA Severe sepsis Immunocompromised host P: Micro and abx as above  ENDOCRINE A:   No active issues P:   SSI  NEUROLOGIC A:  Abd pain ICU associated anxiety P:   RASS goal: 0 Low dose PRN fentanyl PT    FAMILY: Pt and wife updated @ length at bedside 3/16  Will tx SDU and ask [facility] to assume care 3/17.    Nickolas Madrid, NP 02/20/2015  9:54 AM Pager: [phone] or [phone]  PCCM ATTENDING: I have reviewed pt's initial presentation, consultants notes and hospital database in detail.  The above assessment and plan was formulated under my direction.  In summary: Has tolerated extubation with apparently an episode of dyspnea last night requiring transient NPPV. Now very comfortable on [place] O2. Will clamp NGT and allow clear liquids monitoring gasric residuals intermittently. Cont empiric pip-tazo for preseumd aspiration PNA. Would complete 7-8 days  He may sadely go to SDU and TRH to resume primary duties as of AM 3/17. PCCM will see again as pulmonary consultant 3/17 and beyond as needed   Merton Border, MD;  PCCM service; Mobile [phone]

## 2015-02-20 NOTE — Progress Notes (Signed)
[facility] ADULT ICU REPLACEMENT PROTOCOL FOR AM LAB REPLACEMENT ONLY  The patient does apply for the [facility] Adult ICU Electrolyte Replacment Protocol based on the criteria listed below:   1. Is GFR >/= 40 ml/min? Yes.    Patient's GFR today is 88 2. Is urine output >/= 0.5 ml/kg/hr for the last 6 hours? Yes.   Patient's UOP is 2.7 ml/kg/hr 3. Is BUN < 60 mg/dL? Yes.    Patient's BUN today is 13 4. Abnormal electrolyte(s): K+3.3 Phos 1.2 Mg 1.8 5. Ordered repletion with: protocol 6. If a panic level lab has been reported, has the CCM MD in charge been notified? Yes.  .   Physician:  Gwyndolyn Kaufman [facility] 02/20/2015 4:53 AM

## 2015-02-21 DIAGNOSIS — T17998D Other foreign object in respiratory tract, part unspecified causing other injury, subsequent encounter: Secondary | ICD-10-CM

## 2015-02-21 DIAGNOSIS — D696 Thrombocytopenia, unspecified: Secondary | ICD-10-CM

## 2015-02-21 LAB — CBC
HCT: 31.9 % — ABNORMAL LOW (ref 39.0–52.0)
Hemoglobin: 11 g/dL — ABNORMAL LOW (ref 13.0–17.0)
MCH: 28.4 pg (ref 26.0–34.0)
MCHC: 34.5 g/dL (ref 30.0–36.0)
MCV: 82.4 fL (ref 78.0–100.0)
Platelets: 90 10*3/uL — ABNORMAL LOW (ref 150–400)
RBC: 3.87 MIL/uL — AB (ref 4.22–5.81)
RDW: 18.1 % — AB (ref 11.5–15.5)
WBC: 6.9 10*3/uL (ref 4.0–10.5)

## 2015-02-21 LAB — COMPREHENSIVE METABOLIC PANEL
ALBUMIN: 1.4 g/dL — AB (ref 3.5–5.2)
ALT: 29 U/L (ref 0–53)
AST: 69 U/L — ABNORMAL HIGH (ref 0–37)
Alkaline Phosphatase: 62 U/L (ref 39–117)
Anion gap: 10 (ref 5–15)
BUN: 13 mg/dL (ref 6–23)
CHLORIDE: 102 mmol/L (ref 96–112)
CO2: 25 mmol/L (ref 19–32)
CREATININE: 0.85 mg/dL (ref 0.50–1.35)
Calcium: 7.2 mg/dL — ABNORMAL LOW (ref 8.4–10.5)
GFR calc Af Amer: >90 mL/min (ref >90)
GFR calc non Af Amer: >90 mL/min (ref >90)
Glucose, Bld: 124 mg/dL — ABNORMAL HIGH (ref 70–99)
POTASSIUM: 2.7 mmol/L — AB (ref 3.5–5.1)
SODIUM: 137 mmol/L (ref 135–145)
TOTAL PROTEIN: 4.4 g/dL — AB (ref 6.0–8.3)
Total Bilirubin: 0.9 mg/dL (ref 0.3–1.2)

## 2015-02-21 LAB — GLUCOSE, CAPILLARY
GLUCOSE-CAPILLARY: 116 mg/dL — AB (ref 70–99)
GLUCOSE-CAPILLARY: 117 mg/dL — AB (ref 70–99)
GLUCOSE-CAPILLARY: 122 mg/dL — AB (ref 70–99)
Glucose-Capillary: 101 mg/dL — ABNORMAL HIGH (ref 70–99)
Glucose-Capillary: 100 mg/dL — ABNORMAL HIGH (ref 70–99)

## 2015-02-21 LAB — CULTURE, RESPIRATORY W GRAM STAIN

## 2015-02-21 LAB — CULTURE, RESPIRATORY

## 2015-02-21 LAB — MAGNESIUM: Magnesium: 1.9 mg/dL (ref 1.5–2.5)

## 2015-02-21 LAB — PROCALCITONIN: Procalcitonin: 6.71 ng/mL

## 2015-02-21 LAB — PHOSPHORUS: Phosphorus: 2.3 mg/dL (ref 2.3–4.6)

## 2015-02-21 MED ORDER — FAT EMULSION 20 % IV EMUL: 240.0000 mL | INTRAVENOUS | Status: DC

## 2015-02-21 MED ORDER — RESOURCE THICKENUP CLEAR PO POWD
ORAL | Status: DC | PRN
  Filled 2015-02-21 (×2): qty 125

## 2015-02-21 MED ORDER — SODIUM PHOSPHATE 3 MMOLE/ML IV SOLN
10.0000 mmol | Freq: Once | INTRAVENOUS | Status: AC
  Administered 2015-02-21: 10 mmol via INTRAVENOUS
  Filled 2015-02-21: qty 3.33

## 2015-02-21 MED ORDER — POTASSIUM CHLORIDE 10 MEQ/50ML IV SOLN
10.0000 meq | INTRAVENOUS | Status: AC
  Administered 2015-02-21 (×5): 10 meq via INTRAVENOUS
  Filled 2015-02-21 (×4): qty 50

## 2015-02-21 MED ORDER — TRACE MINERALS CR-CU-F-FE-I-MN-MO-SE-ZN IV SOLN
INTRAVENOUS | Status: AC
  Administered 2015-02-21: 18:00:00 via INTRAVENOUS
  Filled 2015-02-21: qty 960

## 2015-02-21 MED ORDER — POTASSIUM CHLORIDE 10 MEQ/50ML IV SOLN
10.0000 meq | INTRAVENOUS | Status: AC
  Administered 2015-02-21 (×3): 10 meq via INTRAVENOUS
  Filled 2015-02-21 (×3): qty 50

## 2015-02-21 MED ORDER — FAT EMULSION 20 % IV EMUL
240.0000 mL | INTRAVENOUS | Status: AC
  Administered 2015-02-21: 240 mL via INTRAVENOUS
  Filled 2015-02-21: qty 250

## 2015-02-21 MED ORDER — METOPROLOL TARTRATE 1 MG/ML IV SOLN
5.0000 mg | Freq: Four times a day (QID) | INTRAVENOUS | Status: DC
  Administered 2015-02-21 – 2015-02-22 (×5): 5 mg via INTRAVENOUS
  Filled 2015-02-21 (×8): qty 5

## 2015-02-21 MED ORDER — MAGNESIUM SULFATE IN D5W 10-5 MG/ML-% IV SOLN
1.0000 g | Freq: Once | INTRAVENOUS | Status: AC
  Administered 2015-02-21: 1 g via INTRAVENOUS
  Filled 2015-02-21: qty 100

## 2015-02-21 MED ORDER — INSULIN ASPART 100 UNIT/ML ~~LOC~~ SOLN
0.0000 [IU] | Freq: Four times a day (QID) | SUBCUTANEOUS | Status: DC
  Administered 2015-02-23 – 2015-02-25 (×2): 1 [IU] via SUBCUTANEOUS

## 2015-02-21 MED ORDER — TRACE MINERALS CR-CU-F-FE-I-MN-MO-SE-ZN IV SOLN: INTRAVENOUS | Status: DC

## 2015-02-21 NOTE — Progress Notes (Signed)
[facility] ADULT ICU REPLACEMENT PROTOCOL FOR AM LAB REPLACEMENT ONLY  The patient does apply for the [facility] Adult ICU Electrolyte Replacment Protocol based on the criteria listed below:   1. Is GFR >/= 40 ml/min? Yes.    Patient's GFR today is >90 2. Is urine output >/= 0.5 ml/kg/hr for the last 6 hours? Yes.   Patient's UOP is 0.5 ml/kg/hr 3. Is BUN < 60 mg/dL? Yes.    Patient's BUN today is 13 4. Abnormal electrolyte(s): K+2.7 Phos 2.3 5. Ordered repletion with: protocol 6. If a panic level lab has been reported, has the CCM MD in charge been notified? Yes.  .   Physician:  Berenda Morale 02/21/2015 6:32 AM

## 2015-02-21 NOTE — Progress Notes (Signed)
Physical Therapy Treatment- Reeval Patient Details Name: Benjamin Blake MRN: [id number] DOB: December 17, 1950 Today's Date: 02/21/2015    History of Present Illness Pt is a 64 y.o. male, who just moved to the area from [place]. He has metastatic colon cancer and presents to our ER for intractable vomiting and diarrhea. His colon cancer was originally diagnosed in 2010. Unfortunately they found a recurrence in 2015 with metastases to the liver, peritoneum, and lung. He is undergoing chemotherapy at [facility]. His oncologist is Dr. Reynaldo Minium. His last chemotherapy treatment was 01/31/15. He was feeling fairly well until Thursday 3/10. After dinner on Thursday he began having vomiting and diarrhea. His wife called the oncologist who recommended pushing fluids including Pedialyte. They attempted to do this, but he became progressively weaker and kept vomiting. Consequently they came to the emergency department. Pt was admitted for further evaluation.3/14 pt with Afib with RVR and intubated, extubated 3/15    PT Comments    Pt very pleasant with attentive family in room throughout. Very soft spoken and difficult to understand due to vocal volume throughout. Pt and family educated for mobility, function and goal update. Pt remains appropriate for therapy given medical complications since admission and recommend HHPT at this time as well as RW. Will continue to follow to address deficits listed below in order to maximize function, activity and gait.   Follow Up Recommendations  Home health PT     Equipment Recommendations  Rolling walker with 5" wheels    Recommendations for Other Services Speech consult;OT consult     Precautions / Restrictions Precautions Precautions: Fall Precaution Comments: flexiseal    Mobility  Bed Mobility               General bed mobility comments: pt in chair on arrival  Transfers Overall transfer level: Needs assistance   Transfers: Sit to/from Stand Sit to  Stand: Min guard         General transfer comment: cues for hand placement, safety and controlled descent to surface  Ambulation/Gait Ambulation/Gait assistance: Min assist Ambulation Distance (Feet): 150 Feet Assistive device: Rolling walker (2 wheeled) Gait Pattern/deviations: Step-through pattern;Decreased stride length   Gait velocity interpretation: Below normal speed for age/gender General Gait Details: cues for posture and position in RW with partial LOB x 5 with assist for balance and stability, slow controlled gait   Stairs            Wheelchair Mobility    Modified Rankin (Stroke Patients Only)       Balance Overall balance assessment: Needs assistance   Sitting balance-Leahy Scale: Fair       Standing balance-Leahy Scale: Poor                      Cognition Arousal/Alertness: Awake/alert Behavior During Therapy: Flat affect Overall Cognitive Status: Within Functional Limits for tasks assessed                      Exercises      General Comments        Pertinent Vitals/Pain Pain Assessment: No/denies pain  HR 115-125 with activity sats 90-95% on 40% venturi throughout    Home Living                      Prior Function            PT Goals (current goals can now be found in the care plan section)  Acute Rehab PT Goals Patient Stated Goal: return to golf, walking and biking PT Goal Formulation: With patient/family Time For Goal Achievement: 03/07/15 Potential to Achieve Goals: Good Progress towards PT goals: Goals downgraded-see care plan    Frequency  Min 3X/week    PT Plan Discharge plan needs to be updated    Co-evaluation             End of Session Equipment Utilized During Treatment: Gait belt;Oxygen Activity Tolerance: Patient tolerated treatment well Patient left: in chair;with call bell/phone within reach;with family/visitor present     Time: 0962-8366 PT Time Calculation (min) (ACUTE  ONLY): 29 min  Charges:  $Gait Training: 8-22 mins                    G Codes:      Melford Aase 02-22-2015, 1:51 PM Elwyn Reach, [place]

## 2015-02-21 NOTE — Progress Notes (Signed)
PARENTERAL NUTRITION CONSULT NOTE - Follow-up  Pharmacy Consult for TPN Indication: intolerance to enteral nutrition, ileus vs SBO  No Known Allergies  Patient Measurements: Height: 5\' 5"  (165.1 cm) Weight: 164 lb 0.4 oz (74.4 kg) IBW/kg (Calculated) : 61.5  Vital Signs: Temp: 99.4 F (37.4 C) (03/17 0427) Temp Source: Oral (03/17 0427) BP: 112/66 mmHg (03/17 0600) Pulse Rate: 115 (03/17 0600) Intake/Output from previous day: 03/16 0701 - 03/17 0700 In: 2162.5 [P.O.:760; I.V.:130; NG/GT:120; IV Piggyback:162.5; TPN:950] Out: 3150 [Urine:1350; Emesis/NG output:600; EQAST:4196] Intake/Output from this shift:    Labs:  Recent Labs  02/19/15 0452 02/20/15 0300 02/21/15 0550  WBC 6.5 5.0 6.9  HGB 12.5* 11.5* 11.0*  HCT 34.7* 32.8* 31.9*  PLT 120* 106* 90*     Recent Labs  02/19/15 0452 02/20/15 0300 02/21/15 0550  NA 133* 134* 137  K 3.9 3.3* 2.7*  CL 103 103 102  CO2 25 27 25   GLUCOSE 97 129* 124*  BUN 19 13 13   CREATININE 0.82 0.95 0.85  CALCIUM 6.8* 7.1* 7.2*  MG  --  1.8 1.9  PHOS  --  1.2* 2.3  PROT  --  4.2* 4.4*  ALBUMIN  --  1.5* 1.4*  AST  --  71* 69*  ALT  --  35 29  ALKPHOS  --  56 62  BILITOT  --  1.1 0.9  PREALBUMIN  --  2.8*  --   TRIG  --  262*  --    Estimated Creatinine Clearance: 86.1 mL/min (by C-G formula based on Cr of 0.85).    Recent Labs  02/20/15 2002 02/20/15 2347 02/21/15 0430  GLUCAP 97 117* 116*   Insulin Requirements in the past 24 hours:  0 units SSI  Current Nutrition:  Clinimix E5/15 at 88ml/hr + lipids 20% at 9ml/hr - provides 48gm protein/day (53% goal) and 1162 Kcal/day (61% goal)  Assessment: 28 yom with metastatic colon cancer undergoing chemotherapy, admitted 3/12 with intractable vomiting and diarrhea. CT abdomen shows possible ileus vs early obstruction. Per patients family, he had not eaten x 6 days prior to admission due to N/V.    GI: Colon cancer with liver + lung mets (Xeloda PTA) - SBO vs  ileus - s/p R hemicolectomy in 12/15. Pt may be at risk for refeeding d/t no PO intake x 7 days prior to TPN initiation. Motility agent (metoclopramide) added. NGT clamped and clear liquids started 3/16 with 760 ml intake recorded. 1200 ml stool recorded.  Endo: TSH WNL, CBGs good on SSI - continue insulin until TPN is at goal Lytes: Na 137, K 2.7 after 2 runs yest (goal 4), 8 K runs ordered by MD, Mg 1.9 after 2 gm yest (goal 2), Phos 1.2 >> 2.3 MD supplemented 5mmol of NaPhos - likely some refeeding as pt has not been eating for a week prior to TPN initiation. Will order 1 gm Mag Renal: Scr 0.85, UOP 0.8 ml/kg/hr Pulm: Extubated 3/15 - CT negative for PE Cards: EF 45-50% - no meds except prn metoprolol IV - troponin mildly elevated Hepatobil: AST mildly elevated - thrombocytopenia (plts trending down) - triglycerides are elevated at 262  ID: Narrowed to zosyn for aspiration PNA - Tmax 99.9, WBC WNL, cultures NGTD- lactic acid + PCT trending down Best Practices: lovenox, MC TPN Access: CVC L IJ TPN day#: 3 (3/15>> )  Nutritional Goals: (per RD 3/15) 1900-2200 kCal/day 90-110 grams of protein/day  Plan:  - Continue Clinimix E5/15 at 84mL/hr - will  not advance until electrolytes are better controlled d/t possible refeeding syndrome - mag 1 gm x 1 in addition to MD 8 runs K and Naphos 10 mMol - Continue Lipids 20% at 19ml/hr - Continue SSI at least until TPN is at goal rate to assess glucose control with full nutrition - F/u AM labs  -f/u diet advancement, on clears currently  Eudelia Bunch, Pharm.D. [phone] 02/21/2015 8:24 AM

## 2015-02-21 NOTE — Progress Notes (Signed)
Report called to Ginger, RN.  Patient transferred to 5 W room 20.  Transferred by Leanord Hawking.

## 2015-02-21 NOTE — Progress Notes (Signed)
CRITICAL VALUE ALERT  Critical value received:  K 2.7   Date of notification:  02/21/15  Time of notification:  0626  Critical value read back: Yes  Nurse who received alert:  Polly Cobia RN  MD notified (1st page): [place]  Time of first page:  0627  MD notified (2nd page):  Time of second page:  Responding MD: Warren Lacy Nurse Eddie Dibbles  Time MD responded:  [phone]

## 2015-02-21 NOTE — Evaluation (Addendum)
Clinical/Bedside Swallow Evaluation Patient Details  Name: Benjamin Blake MRN: [id number] Date of Birth: 01/11/1951  Today's Date: 02/21/2015 Time: SLP Start Time (ACUTE ONLY): 1357 SLP Stop Time (ACUTE ONLY): 1430 SLP Time Calculation (min) (ACUTE ONLY): 33 min  Past Medical History:  Past Medical History  Diagnosis Date  . Cancer    Past Surgical History:  Past Surgical History  Procedure Laterality Date  . Colon surgery     HPI:  Pt is a 64 y.o. male, recently moved from [place]. History metastatic colon cancer admitted for intractable vomiting and diarrhea. Suspicion for aspiration of emesis. Intubated 3/13-3/15. CXR slightly improved, with a lesser degree of consolidation in the right upper lobe. RN noted difficulty swallowing and initiated order for swallow assessment.   Assessment / Plan / Recommendation Clinical Impression  Pt exhibited overt indications aspiration with thin and nectar consistencies likely due intubation. Pharyngeal impairments lessened with honey thick juice. Vocal quality breathy with low intensity although strong volitional cough. Recommend Dys 2 diet texture for a softer/moist texture, honey thick liquids, double swallows, small sips. Prognosis for upgrade good in next 1-2 days.      Aspiration Risk  Moderate    Diet Recommendation Dysphagia 2 (Fine chop);Honey-thick liquid   Liquid Administration via: Cup;No straw Medication Administration: Whole meds with puree Supervision: Patient able to self feed;Full supervision/cueing for compensatory strategies Compensations: Slow rate;Small sips/bites;Multiple dry swallows after each bite/sip Postural Changes and/or Swallow Maneuvers: Seated upright 90 degrees    Other  Recommendations Oral Care Recommendations: Oral care BID Other Recommendations: Order thickener from pharmacy   Follow Up Recommendations  None    Frequency and Duration min 2x/week  2 weeks   Pertinent Vitals/Pain none          Swallow Study           Oral/Motor/Sensory Function Overall Oral Motor/Sensory Function: Appears within functional limits for tasks assessed   Ice Chips Ice chips: Not tested   Thin Liquid Thin Liquid: Impaired Presentation: Cup Oral Phase Impairments:  (none) Pharyngeal  Phase Impairments: Suspected delayed Swallow;Throat Clearing - Immediate;Cough - Immediate;Throat Clearing - Delayed (audible swallow)    Nectar Thick Nectar Thick Liquid: Impaired Presentation: Cup Oral Phase Impairments:  (none) Pharyngeal Phase Impairments: Suspected delayed Swallow;Throat Clearing - Immediate;Throat Clearing - Delayed;Cough - Delayed   Honey Thick Honey Thick Liquid: Impaired Presentation: Cup Pharyngeal Phase Impairments: Throat Clearing - Delayed (x 1)   Puree Puree: Not tested   Solid   GO    Solid: Within functional limits       Mick Sell Orbie Pyo 02/21/2015,2:56 PM   Orbie Pyo Colvin Caroli.[employer] [phone]

## 2015-02-21 NOTE — Progress Notes (Signed)
PULMONARY / CRITICAL CARE MEDICINE   Name: Benjamin Blake MRN: [id number] DOB: 06-08-1951    ADMISSION DATE:  02/16/2015 CONSULTATION DATE:  02/18/2015  REFERRING MD : edp  CHIEF COMPLAINT:  hypoxia  INITIAL PRESENTATION:  7 M with metastatic colon cancer (undergoing chemotherapy) admitted 3/12 by [facility] for intractable vomiting and diarrhea. Developed respiratory distress 3/14 due to apparent aspiration. Transferred to ICU, PCCM consult, intubated  STUDIES/SIGNIFICANT EVENTS:  3/14 CT chest:  Negative for pulmonary embolism. Multifocal airspace opacities in both lungs, possibly due to infectious infiltrates. Pulmonary hemorrhages could also have this appearance.  Low-attenuation liver lesions, not simple cysts. These could represent metastases. 3/14 CTAP: Postsurgical changes in the right upper quadrant consistent with the small bowel to transverse colon anastomosis. The anastomosis appears patent although some mild inflammatory changes are noted within the mesenteric and small bowel dilatation is seen. It is uncertain whether this represents a partial small bowel obstruction or ileus  3/14 transient AFRVR. Converted with amiodarone 3/15 passed SBT. Cognition intact. Extubated and tolerating. TPN initiated. Motility agent initiated 3/17 transfer to telemetry, NGT removed. Advance diet  INDWELLING DEVICES:: ETT 3/14 >> 3/15 R IJ CVL 3/14 >>   MICRO DATA: MRSA PCR 3/12 >> NEG C diff 3/12 >> NEG CDiff 3/15>>>NEG Blood 3/14 >>  Resp 3/14 >> moderate e coli (pansens) Urine 3/14>>> neg   ANTIMICROBIALS:  Vanc 3/14 >> 3/15 Imipenem 3/14 >> 3/15 Pip-tazo 3/15 >>   SUBJECTIVE:   No new complaints.tolerating extubation. Ambulating with assistance. Has not required NPPV. RN notes cough after PO intake  VITAL SIGNS: Temp:  [98.3 F (36.8 C)-100.8 F (38.2 C)] 98.5 F (36.9 C) (03/17 1521) Pulse Rate:  [106-127] 120 (03/17 1521) Resp:  [12-38] 30 (03/17 1521) BP: (95-138)/(55-85) 99/67  mmHg (03/17 1521) SpO2:  [90 %-99 %] 93 % (03/17 1521) FiO2 (%):  [30 %] 30 % (03/17 1022) HEMODYNAMICS:   VENTILATOR SETTINGS: Vent Mode:  [-]  FiO2 (%):  [30 %] 30 % INTAKE / OUTPUT:  Intake/Output Summary (Last 24 hours) at 02/21/15 1711 Last data filed at 02/21/15 1319  Gross per 24 hour  Intake 2095.83 ml  Output   2875 ml  Net -779.17 ml    PHYSICAL EXAMINATION: General:  NAD   Neuro: no focal deficits HEENT:  NCAT Cardiovascular: Reg, no M Lungs:  Scattered rhonchi Abdomen: Soft. NT, diminished BS Ext: Intact. No edema.   LABS: I have reviewed all of today's lab results. Relevant abnormalities are discussed in the A/P section.  CXR: NNF   ASSESSMENT / PLAN:  PULMONARY A: Acute respiratory failure Aspiration PNA, E coli P:   Supp O2 as needed Airway hygiene Follow CXR intermittently  CARDIOVASCULAR A:  PAF > sinus tach - improved on metoprolol (changed to IV 3/17) Hypotension, resolved P:  Cont metoprolol - change to PO when able Monitor BP and rhythm  RENAL A:   AKI, resolved Hyponatremia, mild Hypokalemia - repleted Metabolic acidosis, resolved  Hypovolemia, resolved P:   Monitor BMET intermittently Monitor I/Os Correct electrolytes as indicated  GASTROINTESTINAL A:   Metastatic colon cancer Ileus, doubt SBO or bowel ischemia Intractable N/V Diarrhea - cdiff neg 3/12 and 3/16 Protein calorie malnutrition - very poor PO intake X > 1 week P:   DC NG tube 3/17 Advance diet as able Cont TPN - DC when able to meet nutritional needs by mouth Cont metoclopramide   Discussed with Dr Clelia Croft, his oncologist @ [facility] 3/15  Further chemo decisions  to be made after discharge  Please ensure that a DC summary is directed to Dr Reynaldo Minium @ [facility] A:   Mild anemia Thrombocytopenia P:  DVT px: enoxaparin - consider DC if plts drop further Monitor CBC intermittently Transfuse per usual guidelines  INFECTIOUS A:   Asp  PNA Severe sepsis Immunocompromised host P: Monitor temp, WBC count Micro and abx as above  ENDOCRINE A:   Risk of hyperglycemia on TPN P:   Cont SSI while on TPN  NEUROLOGIC A:  Abd pain ICU associated anxiety Deconditioning P:   RASS goal: 0 Low dose PRN fentanyl Cont PT    FAMILY: Pt and wife updated 3/17  Transfer to tele. TRH to resume primary duties in AM 3/18. PCCM will see at least once more.    Merton Border, MD;  PCCM service; Mobile [phone]

## 2015-02-22 ENCOUNTER — Inpatient Hospital Stay (HOSPITAL_COMMUNITY): Payer: BLUE CROSS/BLUE SHIELD

## 2015-02-22 DIAGNOSIS — T17998S Other foreign object in respiratory tract, part unspecified causing other injury, sequela: Secondary | ICD-10-CM

## 2015-02-22 DIAGNOSIS — I4891 Unspecified atrial fibrillation: Secondary | ICD-10-CM

## 2015-02-22 LAB — MAGNESIUM: Magnesium: 2 mg/dL (ref 1.5–2.5)

## 2015-02-22 LAB — CBC
HCT: 31.1 % — ABNORMAL LOW (ref 39.0–52.0)
HEMOGLOBIN: 10.6 g/dL — AB (ref 13.0–17.0)
MCH: 29 pg (ref 26.0–34.0)
MCHC: 34.1 g/dL (ref 30.0–36.0)
MCV: 85 fL (ref 78.0–100.0)
Platelets: 83 10*3/uL — ABNORMAL LOW (ref 150–400)
RBC: 3.66 MIL/uL — AB (ref 4.22–5.81)
RDW: 18.8 % — ABNORMAL HIGH (ref 11.5–15.5)
WBC: 8.8 10*3/uL (ref 4.0–10.5)

## 2015-02-22 LAB — GLUCOSE, CAPILLARY
Glucose-Capillary: 109 mg/dL — ABNORMAL HIGH (ref 70–99)
Glucose-Capillary: 109 mg/dL — ABNORMAL HIGH (ref 70–99)
Glucose-Capillary: 111 mg/dL — ABNORMAL HIGH (ref 70–99)
Glucose-Capillary: 92 mg/dL (ref 70–99)
Glucose-Capillary: 97 mg/dL (ref 70–99)

## 2015-02-22 LAB — BASIC METABOLIC PANEL
ANION GAP: 4 — AB (ref 5–15)
BUN: 17 mg/dL (ref 6–23)
CO2: 29 mmol/L (ref 19–32)
Calcium: 7.6 mg/dL — ABNORMAL LOW (ref 8.4–10.5)
Chloride: 108 mmol/L (ref 96–112)
Creatinine, Ser: 0.89 mg/dL (ref 0.50–1.35)
GFR calc Af Amer: >90 mL/min (ref >90)
GLUCOSE: 111 mg/dL — AB (ref 70–99)
Potassium: 2.9 mmol/L — ABNORMAL LOW (ref 3.5–5.1)
Sodium: 141 mmol/L (ref 135–145)

## 2015-02-22 LAB — PHOSPHORUS: Phosphorus: 2.5 mg/dL (ref 2.3–4.6)

## 2015-02-22 MED ORDER — GUAIFENESIN ER 600 MG PO TB12
1200.0000 mg | ORAL_TABLET | Freq: Two times a day (BID) | ORAL | Status: DC
  Administered 2015-02-22: 1200 mg via ORAL
  Filled 2015-02-22 (×4): qty 2

## 2015-02-22 MED ORDER — METOPROLOL TARTRATE 25 MG PO TABS
25.0000 mg | ORAL_TABLET | Freq: Four times a day (QID) | ORAL | Status: DC
  Administered 2015-02-22: 25 mg via ORAL
  Filled 2015-02-22 (×7): qty 1

## 2015-02-22 MED ORDER — POTASSIUM CHLORIDE 10 MEQ/50ML IV SOLN
10.0000 meq | INTRAVENOUS | Status: AC
  Administered 2015-02-22 (×2): 10 meq via INTRAVENOUS
  Filled 2015-02-22 (×2): qty 50

## 2015-02-22 MED ORDER — POTASSIUM CHLORIDE 10 MEQ/50ML IV SOLN
10.0000 meq | INTRAVENOUS | Status: AC
  Administered 2015-02-22 (×4): 10 meq via INTRAVENOUS
  Filled 2015-02-22 (×6): qty 50

## 2015-02-22 MED ORDER — METOPROLOL TARTRATE 1 MG/ML IV SOLN
5.0000 mg | Freq: Four times a day (QID) | INTRAVENOUS | Status: AC
  Administered 2015-02-22 (×2): 5 mg via INTRAVENOUS
  Filled 2015-02-22 (×2): qty 5

## 2015-02-22 MED ORDER — SCOPOLAMINE 1 MG/3DAYS TD PT72
1.0000 | MEDICATED_PATCH | TRANSDERMAL | Status: DC
  Administered 2015-02-22 – 2015-02-25 (×2): 1.5 mg via TRANSDERMAL
  Filled 2015-02-22 (×2): qty 1

## 2015-02-22 MED ORDER — CEFTRIAXONE SODIUM IN DEXTROSE 20 MG/ML IV SOLN
1.0000 g | INTRAVENOUS | Status: AC
  Administered 2015-02-22 – 2015-02-25 (×4): 1 g via INTRAVENOUS
  Filled 2015-02-22 (×4): qty 50

## 2015-02-22 MED ORDER — TRACE MINERALS CR-CU-F-FE-I-MN-MO-SE-ZN IV SOLN
INTRAVENOUS | Status: AC
  Administered 2015-02-22: 18:00:00 via INTRAVENOUS
  Filled 2015-02-22: qty 960

## 2015-02-22 MED ORDER — FAT EMULSION 20 % IV EMUL
240.0000 mL | INTRAVENOUS | Status: AC
  Administered 2015-02-22: 240 mL via INTRAVENOUS
  Filled 2015-02-22: qty 250

## 2015-02-22 NOTE — Progress Notes (Signed)
ANTIBIOTIC CONSULT NOTE - Follow-up  Pharmacy Consult for zosyn Indication: pneumonia  No Known Allergies  Patient Measurements: Height: 5\' 5"  (165.1 cm) Weight: 164 lb 0.4 oz (74.4 kg) IBW/kg (Calculated) : 61.5  Vital Signs: Temp: 98.3 F (36.8 C) (03/18 0616) Temp Source: Oral (03/18 0616) BP: 111/66 mmHg (03/18 0616) Pulse Rate: 116 (03/18 0007) Intake/Output from previous day: 03/17 0701 - 03/18 0700 In: 1343.3 [I.V.:40; IV Piggyback:753.3; TPN:550] Out: 3570 [Urine:870; Stool:2700] Intake/Output from this shift:    Labs:  Recent Labs  02/20/15 0300 02/21/15 0550 02/22/15 0630  WBC 5.0 6.9 8.8  HGB 11.5* 11.0* 10.6*  PLT 106* 90* 83*  CREATININE 0.95 0.85 0.89   Assessment: 64 yo male with metastatic colon cancer on Xeloda prior to admission continue on zosyn D#4 for possible aspiration pneumonia in an immunocompromised host. Tmax is 100.7 and WBC is WNL. Renal fxn has been stable.   3/14 vanc > 3/15 3/14 primaxin > 3/15 3/14 fluconazole > 3/15 3/15 zosyn >  3/14 blood cx: ngtd 3/14 urine cx - NEG 3/14 resp cx - moderate ecoli, moderate candida 3/14 mrsa: neg 3/12 cdiff: neg  Goal of Therapy:  Resolution of infection  Plan:  - Zosyn 3.375gm IV Q8H (4 hr inf) - F/u renal fxn, C&S, clinical status  - MD - please evaluate planned LOT  *Pharmacy will sign off and follow peripherally as not dose adjustment are anticipated. Thank you for the consult!  Salome Arnt, PharmD, BCPS Pager # [phone] 02/22/2015 10:31 AM

## 2015-02-22 NOTE — Progress Notes (Signed)
Speech Language Pathology Treatment: Dysphagia  Patient Details Name: Benjamin Blake MRN: [id number] DOB: Apr 17, 1951 Today's Date: 02/22/2015 Time: 1350-1410 SLP Time Calculation (min) (ACUTE ONLY): 20 min  Assessment / Plan / Recommendation Clinical Impression  Pt seen at bedside for check of tolerance following bedside assessment yesterday. The pt has reportedly been coughing with meals per RN and family. Upon SLP assessment pt extremely dysphonic, on venti mask. Trials of small sips of honey thick liquids immediately resulted in wet vocal quality, immediate and delayed cough. Pt is not tolerating diet. SLP gave pt choice of MBS or FEES, pt chose FEES asap. Will proceed with FEES to determine PO plan for this weekend.    HPI HPI: Pt is a 64 y.o. male, recently moved from [place]. History metastatic colon cancer admitted for intractable vomiting and diarrhea. Suspicion for aspiration of emesis. Intubated 3/13-3/15. CXR slightly improved, with a lesser degree of consolidation in the right upper lobe. RN noted difficulty swallowing and initiated order for swallow assessment.   Pertinent Vitals    SLP Plan   (FEES)    Recommendations Diet recommendations: NPO              General recommendations: Rehab consult Oral Care Recommendations: Oral care BID Follow up Recommendations: Inpatient Rehab Plan:  (FEES)    GO    Benjamin Baltimore, MA CCC-SLP [phone]  Benjamin Blake, Katherene Ponto 02/22/2015, 2:36 PM

## 2015-02-22 NOTE — Progress Notes (Signed)
Spoke to provider Elgergawy on phone. Order is to insert kangaroo ng tube. Order changed.

## 2015-02-22 NOTE — Consult Note (Addendum)
Name: Benjamin Blake is a 64 y.o. male Admit date: 02/16/2015 Referring Physician:  Albertine Patricia, MD Primary Physician:  Dr. Reynaldo Blake, Oncologist, [facility] Primary Cardiologist:  None  Reason for Consultation:  Atrial fibrillation  ASSESSMENT: 1. New onset atrial fibrillation in setting of aspiration pneumonia with CHADS-VASC score < 2 2. Metastatic colon cancer, initially diagnosed in 2010, with recurrence and documentation of metastatic disease in 2015. 3. Aspiration pneumonia with respiratory failure 4. Hypokalemia 5. Cachectic and frail appearing  PLAN:  1. Agree with aspirin for his embolic protection, assuming it is felt to be safe given his other comorbid conditions. There is no indication at this time for anticoagulation 2. Beta blocker therapy to help slow the sinus tachycardia and provide anti-ischemic protection assuming his blood pressure control rate beta-blockade. 3. Check orthostatic blood pressures 4. No specific therapy for atrial fibrillation unless it recurs    HPI: 64 year old gentleman with metastatic colon cancer and no prior history of heart disease. We'll consult because of an episode of atrial fibrillation. This is A 12-lead EKG but Not Present on Telemetry. Currently on Telemetry He Has Sinus Tachycardia at a Rate of around 120 Bpm.  No prior history of heart disease. Father had first myocardial infarction in his 47s.  PMH:   Past Medical History  Diagnosis Date  . Cancer     PSH:   Past Surgical History  Procedure Laterality Date  . Colon surgery     Allergies:  Review of patient's allergies indicates no known allergies. Prior to Admit Meds:   Prescriptions prior to admission  Medication Sig Dispense Refill Last Dose  . capecitabine (XELODA) 500 MG tablet Take 1,500 mg by mouth See admin instructions. Take 3 tab (1500 mg) in AM and 3 tab (1500 mg) in PM (12 hours apart) by mouth with water AFTER meal days 1-14. OFF  days 15-21   Past Week at Unknown time  . Cetirizine HCl 10 MG CAPS Take 10 mg by mouth daily.   02/15/2015 at Unknown time  . ondansetron (ZOFRAN) 8 MG tablet Take by mouth every 8 (eight) hours as needed for nausea or vomiting.   02/15/2015 at Unknown time  . prochlorperazine (COMPAZINE) 10 MG tablet Take 10 mg by mouth every 6 (six) hours as needed for nausea or vomiting.   02/15/2015 at Unknown time  . rosuvastatin (CRESTOR) 10 MG tablet Take 10 mg by mouth daily.   02/15/2015 at Unknown time  . senna-docusate (SENOKOT-S) 8.6-50 MG per tablet Take 1 tablet by mouth daily as needed.   Past Week at Unknown time   Fam HX:    Family History  Problem Relation Age of Onset  . Cancer - Colon Mother   . Heart disease Father    Social HX:    History   Social History  . Marital Status: Married    Spouse Name: [name]  . Number of Children: [name]  . Years of Education: [name]   Occupational History  . Not on file.   Social History Main Topics  . Smoking status: Never Smoker   . Smokeless tobacco: Not on file  . Alcohol Use: Yes     Comment: Social drinker  . Drug Use: No  . Sexual Activity: Yes     Comment: wife does not need   Other Topics Concern  . Not on file   Social History Narrative     Review of Systems: Nausea and vomiting starting an increasing after  his last chemotherapy. He denies orthopnea PND. He did develop respiratory distress and aspiration pneumonia for which he was treated earlier during this hospital stay. He is hoarse from prolonged intubation. He has not currently hungry. He is not uncomfortable. He denies dyspnea.  Physical Exam: Blood pressure 111/74, pulse 125, temperature 98.2 F (36.8 C), temperature source Oral, resp. rate 20, height 5\' 5"  (1.651 m), weight 164 lb 0.4 oz (74.4 kg), SpO2 95 %. Weight change:    Frail appearing and pale. No acute distress No JVD Cardiac exam reveals tachycardia but otherwise unremarkable Chest is clear anteriorly Extremities  reveal no edema. Neurological exam is unremarkable Labs: Lab Results  Component Value Date   WBC 8.8 02/22/2015   HGB 10.6* 02/22/2015   HCT 31.1* 02/22/2015   MCV 85.0 02/22/2015   PLT 83* 02/22/2015    Recent Labs Lab 02/21/15 0550 02/22/15 0630  NA 137 141  K 2.7* 2.9*  CL 102 108  CO2 25 29  BUN 13 17  CREATININE 0.85 0.89  CALCIUM 7.2* 7.6*  PROT 4.4*  --   BILITOT 0.9  --   ALKPHOS 62  --   ALT 29  --   AST 69*  --   GLUCOSE 124* 111*   No results found for: PTT No results found for: INR, PROTIME Lab Results  Component Value Date   TROPONINI 0.04* 02/20/2015    No results found for: CHOL No results found for: HDL No results found for: [facility] Lab Results  Component Value Date   TRIG 262* 02/20/2015   No results found for: CHOLHDL No results found for: LDLDIRECT    Radiology:  Dg Chest Port 1 View  02/22/2015   CLINICAL DATA:  Respiratory failure  EXAM: PORTABLE CHEST - 1 VIEW  COMPARISON:  02/20/2015  FINDINGS: The nasogastric tube is been removed. The left jugular central line extends into the SVC. The right jugular Port-A-Cath extends into the cavoatrial junction. There is improvement, with slightly better aeration in the bases and improved lung volumes.  IMPRESSION: Slight improvement.   Electronically Signed   By: Andreas Newport M.D.   On: 02/22/2015 06:38   BNP    Component Value Date/Time   BNP 59.4 02/17/2015 2200    ProBNP No results found for: PROBNP  EKG:  Sinus tachycardia at 122 bpm with inferior and anterolateral Q wave infarction of undetermined age.  ECHOCARDIOGRAM:  02/18/15 Left ventricle: The cavity size was normal. Wall thickness was increased in a pattern of moderate LVH. There was focal basal hypertrophy. Systolic function was mildly reduced. The estimated ejection fraction was in the range of 45% to 50%. Diffuse hypokinesis.  Sinclair Grooms 02/22/2015 4:02 PM

## 2015-02-22 NOTE — Progress Notes (Signed)
Patient's urine is extremely dark-NP Schorr notified.

## 2015-02-22 NOTE — Progress Notes (Signed)
Physical Therapy Treatment Patient Details Name: Benjamin Blake MRN: [id number] DOB: Apr 11, 1951 Today's Date: 02/22/2015    History of Present Illness Pt is a 64 y.o. male, who just moved to the area from [place]. He has metastatic colon cancer and presents to our ER for intractable vomiting and diarrhea. His colon cancer was originally diagnosed in 2010. Unfortunately they found a recurrence in 2015 with metastases to the liver, peritoneum, and lung. He is undergoing chemotherapy at [facility]. His oncologist is Dr. Reynaldo Minium. His last chemotherapy treatment was 01/31/15. He was feeling fairly well until Thursday 3/10. After dinner on Thursday he began having vomiting and diarrhea. His wife called the oncologist who recommended pushing fluids including Pedialyte. They attempted to do this, but he became progressively weaker and kept vomiting. Consequently they came to the emergency department. Pt was admitted for further evaluation.3/14 pt with Afib with RVR and intubated, extubated 3/15    PT Comments    Patient progressing towards physical therapy goals. Ambulates with min assist using a rolling walker for supper. SpO2 maintained 95% on 4L supplemental O2 venturi mask. Tolerated therapeutic exercises well. Ambulatory distance limited by HR elevating to 133 (118 at rest.) Patient will continue to benefit from skilled physical therapy services to further improve independence with functional mobility.   Follow Up Recommendations  Home health PT     Equipment Recommendations  Rolling walker with 5" wheels    Recommendations for Other Services Speech consult;OT consult     Precautions / Restrictions Precautions Precautions: Fall Precaution Comments: flexiseal Restrictions Weight Bearing Restrictions: No    Mobility  Bed Mobility Overal bed mobility: Needs Assistance Bed Mobility: Supine to Sit     Supine to sit: Supervision;HOB elevated     General bed mobility comments:  Supervision for safety and to manage lines/leads. HOB elevated and used rail  Transfers Overall transfer level: Needs assistance Equipment used: Rolling walker (2 wheeled) Transfers: Sit to/from Stand Sit to Stand: Min guard         General transfer comment: Min guard for boost to stand from lowest bed setting. Cues for hand placement.  Ambulation/Gait Ambulation/Gait assistance: Min assist Ambulation Distance (Feet): 40 Feet Assistive device: Rolling walker (2 wheeled) Gait Pattern/deviations: Step-through pattern;Decreased stride length;Narrow base of support;Trunk flexed Gait velocity: decreased   General Gait Details: Slow and guarded. min assist for stability intermittently with turns. No buckling noted during bout. Limited distance due to elevating HR up to 133. SpO2 94% and greater on 4L venti mask flow 4-30   Stairs            Wheelchair Mobility    Modified Rankin (Stroke Patients Only)       Balance                                    Cognition Arousal/Alertness: Awake/alert Behavior During Therapy: WFL for tasks assessed/performed Overall Cognitive Status: Within Functional Limits for tasks assessed                      Exercises General Exercises - Lower Extremity Ankle Circles/Pumps: AROM;Both;10 reps;Seated Long Arc Quad: Strengthening;Both;10 reps;Seated Hip Flexion/Marching: Strengthening;Both;10 reps;Seated    General Comments General comments (skin integrity, edema, etc.): Limited today due to elevated HR. Family present and very supportive. Pt motivated      Pertinent Vitals/Pain      Home Living  Prior Function            PT Goals (current goals can now be found in the care plan section) Acute Rehab PT Goals PT Goal Formulation: With patient/family Time For Goal Achievement: 03/07/15 Potential to Achieve Goals: Good Progress towards PT goals: Progressing toward goals     Frequency  Min 3X/week    PT Plan Current plan remains appropriate    Co-evaluation             End of Session Equipment Utilized During Treatment: Gait belt;Oxygen Activity Tolerance: Treatment limited secondary to medical complications (Comment) (limited by HR to 133 ) Patient left: in chair;with call bell/phone within reach;with family/visitor present;Other (comment) (SLP in room)     Time: 6553-7482 PT Time Calculation (min) (ACUTE ONLY): 27 min  Charges:  $Gait Training: 8-22 mins $Therapeutic Exercise: 8-22 mins                    G Codes:      Ellouise Newer 2015-02-26, 3:06 PM Camille Bal [place], [place]

## 2015-02-22 NOTE — Procedures (Cosign Needed)
Objective Swallowing Evaluation: Fiberoptic Endoscopic Evaluation of Swallowing  Patient Details  Name: Benjamin Blake MRN: [id number] Date of Birth: Sep 21, 1951  Today's Date: 02/22/2015 Time: SLP Start Time (ACUTE ONLY): 1500-SLP Stop Time (ACUTE ONLY): 1553 SLP Time Calculation (min) (ACUTE ONLY): 53 min  Past Medical History:  Past Medical History  Diagnosis Date  . Cancer    Past Surgical History:  Past Surgical History  Procedure Laterality Date  . Colon surgery     HPI:  HPI: Pt is a 64 y.o. male, recently moved from [place]. History metastatic colon cancer admitted for intractable vomiting and diarrhea. Suspicion for aspiration of emesis. Intubated 3/13-3/15. CXR slightly improved, with a lesser degree of consolidation in the right upper lobe. RN noted difficulty swallowing and initiated order for swallow assessment.  No Data Recorded  Assessment / Plan / Recommendation CHL IP CLINICAL IMPRESSIONS 02/22/2015  Dysphagia Diagnosis Severe pharyngeal phase dysphagia  Clinical impression Pt demonstrates a severe oropharyngeal dysphagia due to gross muscular weakness of hyolaryngeal structures, with severe residuals combining with thick standing secretions. Oropharyngeal tissues are red with white patches, irritated. Vocal folds are in tact, adduction is adequate. Pt is not safe to consume any texture of PO due to high risk of aspiration. Pt should be NPO, though he may have 2-3 ice chips following oral care. SLP will f/u in 2-3 days for reassessment and readiness for PO. Discussed pt and recommendations for secretions management with MD.        Sheliah Plane IP TREATMENT RECOMMENDATION 02/22/2015  Treatment Plan Recommendations Therapy as outlined in treatment plan below     CHL IP DIET RECOMMENDATION 02/22/2015  Diet Recommendations NPO;Alternative means - temporary;Ice chips PRN after oral care  Liquid Administration via (None)  Medication Administration Via alternative means   Compensations (None)  Postural Changes and/or Swallow Maneuvers (None)     CHL IP OTHER RECOMMENDATIONS 02/22/2015  Recommended Consults (None)  Oral Care Recommendations Oral care Q4 per protocol  Other Recommendations (None)     CHL IP FOLLOW UP RECOMMENDATIONS 02/22/2015  Follow up Recommendations Inpatient Rehab     CHL IP FREQUENCY AND DURATION 02/22/2015  Speech Therapy Frequency (ACUTE ONLY) min 2x/week  Treatment Duration 2 weeks     Pertinent Vitals/Pain NA    SLP Swallow Goals No flowsheet data found.  No flowsheet data found.    CHL IP REASON FOR REFERRAL 02/22/2015  Reason for Referral Objectively evaluate swallowing function     CHL IP ORAL PHASE 02/22/2015  Lips (None)  Tongue (None)  Mucous membranes (None)  Nutritional status (None)  Other (None)  Oxygen therapy (None)  Oral Phase WFL  Oral - Pudding Teaspoon (None)  Oral - Pudding Cup (None)  Oral - Honey Teaspoon (None)  Oral - Honey Cup (None)  Oral - Honey Syringe (None)  Oral - Nectar Teaspoon (None)  Oral - Nectar Cup (None)  Oral - Nectar Straw (None)  Oral - Nectar Syringe (None)  Oral - Ice Chips (None)  Oral - Thin Teaspoon (None)  Oral - Thin Cup (None)  Oral - Thin Straw (None)  Oral - Thin Syringe (None)  Oral - Puree (None)  Oral - Mechanical Soft (None)  Oral - Regular (None)  Oral - Multi-consistency (None)  Oral - Pill (None)  Oral Phase - Comment (None)      CHL IP PHARYNGEAL PHASE 02/22/2015  Pharyngeal Phase Impaired  Pharyngeal - Pudding Teaspoon (None)  Penetration/Aspiration details (pudding teaspoon) (None)  Pharyngeal -  Pudding Cup (None)  Penetration/Aspiration details (pudding cup) (None)  Pharyngeal - Honey Teaspoon (None)  Penetration/Aspiration details (honey teaspoon) (None)  Pharyngeal - Honey Cup (None)  Penetration/Aspiration details (honey cup) (None)  Pharyngeal - Honey Syringe (None)  Penetration/Aspiration details (honey syringe) (None)  Pharyngeal -  Nectar Teaspoon Reduced pharyngeal peristalsis;Reduced epiglottic inversion;Reduced anterior laryngeal mobility;Reduced laryngeal elevation;Reduced tongue base retraction;Penetration/Aspiration during swallow;Penetration/Aspiration after swallow;Significant aspiration (Amount);Pharyngeal residue - pyriform sinuses;Pharyngeal residue - valleculae;Inter-arytenoid space residue;Lateral channel residue;Compensatory strategies attempted (Comment)  Penetration/Aspiration details (nectar teaspoon) Material enters airway, passes BELOW cords and not ejected out despite cough attempt by patient  Pharyngeal - Nectar Cup (None)  Penetration/Aspiration details (nectar cup) (None)  Pharyngeal - Nectar Straw (None)  Penetration/Aspiration details (nectar straw) (None)  Pharyngeal - Nectar Syringe (None)  Penetration/Aspiration details (nectar syringe) (None)  Pharyngeal - Ice Chips Reduced pharyngeal peristalsis;Reduced epiglottic inversion;Reduced anterior laryngeal mobility;Reduced laryngeal elevation;Reduced airway/laryngeal closure;Reduced tongue base retraction;Penetration/Aspiration after swallow;Trace aspiration;Pharyngeal residue - valleculae;Lateral channel residue;Inter-arytenoid space residue;Pharyngeal residue - pyriform sinuses  Penetration/Aspiration details (ice chips) Material enters airway, CONTACTS cords then ejected out  Pharyngeal - Thin Teaspoon (None)  Penetration/Aspiration details (thin teaspoon) (None)  Pharyngeal - Thin Cup (None)  Penetration/Aspiration details (thin cup) (None)  Pharyngeal - Thin Straw (None)  Penetration/Aspiration details (thin straw) (None)  Pharyngeal - Thin Syringe (None)  Penetration/Aspiration details (thin syringe') (None)  Pharyngeal - Puree Reduced pharyngeal peristalsis;Reduced epiglottic inversion;Reduced anterior laryngeal mobility;Reduced laryngeal elevation;Reduced airway/laryngeal closure;Reduced tongue base retraction;Penetration/Aspiration after  swallow;Penetration/Aspiration during swallow;Moderate aspiration;Pharyngeal residue - valleculae;Pharyngeal residue - pyriform sinuses;Lateral channel residue;Inter-arytenoid space residue  Penetration/Aspiration details (puree) Material enters airway, CONTACTS cords and not ejected out;Material enters airway, passes BELOW cords and not ejected out despite cough attempt by patient  Pharyngeal - Mechanical Soft (None)  Penetration/Aspiration details (mechanical soft) (None)  Pharyngeal - Regular (None)  Penetration/Aspiration details (regular) (None)  Pharyngeal - Multi-consistency (None)  Penetration/Aspiration details (multi-consistency) (None)  Pharyngeal - Pill (None)  Penetration/Aspiration details (pill) (None)  Pharyngeal Comment (None)     No flowsheet data found.  No flowsheet data found.        [name], MA CCC-SLP [phone]  Lynann Beaver 02/22/2015, 4:00 PM

## 2015-02-22 NOTE — Progress Notes (Signed)
Patient Demographics  Benjamin Blake, is a 64 y.o. male, DOB - Jul 01, 1951, LKG:[id number]  Admit date - 02/16/2015   Admitting Physician Allie Bossier, MD  Outpatient Primary MD for the patient is Default, Provider, MD  LOS - 5   Chief Complaint  Patient presents with  . Dehydration      Admission history of present illness/brief narrative: Benjamin Blake is a 64 y.o. male, who just moved to the area from [place].He has metastatic colon cancer and presents to our ER for intractable vomiting and diarrhea. His colon cancer was originally diagnosed in 2010. He underwent surgery at that time. Unfortunately they found a recurrence in 2015 with metastases to the liver, peritoneum, and lung. He is undergoing chemotherapy at [facility]. His oncologist is Dr. Reynaldo Minium. His last chemotherapy treatment was 01/31/15. He was feeling fairly well until Thursday 3/10. Developed respiratory distress 3/14 due to apparent aspiration. Transferred to ICU, PCCM consult, intubated on 3/14, patient was extubated on 3/15 , was on TPN, NGT was discontinued on 3/17, at the swallow evaluation, advanced to dysphagia 2 with thick liquid.  Subjective:   [name] today has, No headache, No chest pain, No abdominal pain - No Nausea, still having diarrhea, reports cough,  Assessment & Plan    Active Problems:   AKI (acute kidney injury)   Hypokalemia   Hyponatremia   Nausea with vomiting   Colon cancer, metastatic   Acute respiratory failure   Aspiration into airway   Abdominal pain  Acute hypoxic respiratory failure - Secondary to aspiration pneumonia, post extubation 3/14, extubated on 3/15 - Currently on nasal cannula, wean as tolerated.  Aspiration pneumonia - Continue with IV Zosyn - Sputum culture growing Escherichia coli, sensitive to Zosyn, treat for 5-7 days.  Atrial  fibrillation - New onset, Chadsvasc 2 score of 0 , will start aspirin when more stable. - will consult cardiology. - wll transition from IV to po metoprolol.  diarrhea - C. difficile negative 2, most likely related to chemotherapy. - DC IV Reglan, C. difficile negative, once patient tolerated oral intake, will start on Imodium.  Protein calorie malnutrition - Continue with TPN, when tolerating oral intake will DC TPN  Hypokalemia - Replaced per pharmacy part of TPN management.  Thrombocytopenia - Most likely related to sepsis. Check HIT antibody, Lovenox on hold, continue with SCDs for DVT prophylaxis. - Continue to monitor closely,  Metastatic colon cancer - Agent received chemotherapy at [facility], further chemotherapy decisions by [facility] after discharge.   Code Status: Full  Family Communication: Wife and son at bedside  Disposition Plan: Doing further workup   Procedures  Intubated /14, extubated 3/15   Consults   PCCM   Medications  Scheduled Meds: . antiseptic oral rinse  7 mL Mouth Rinse q12n4p  . chlorhexidine  15 mL Mouth Rinse BID  . enoxaparin (LOVENOX) injection  40 mg Subcutaneous Q24H  . guaiFENesin  1,200 mg Oral BID  . insulin aspart  0-9 Units Subcutaneous Q6H  . metoprolol  5 mg Intravenous 4 times per day  . piperacillin-tazobactam (ZOSYN)  IV  3.375 g Intravenous 3 times per day  . potassium chloride  10 mEq Intravenous Q1 Hr x 6  . sodium chloride  3 mL Intravenous Q12H   Continuous Infusions: . Marland KitchenTPN (CLINIMIX-E) Adult 40 mL/hr at 02/21/15 1805   And  . fat emulsion 240 mL (02/21/15 1806)  . Marland KitchenTPN (CLINIMIX-E) Adult     And  . fat emulsion     PRN Meds:.fentaNYL, ondansetron (ZOFRAN) IV, RESOURCE THICKENUP CLEAR, sodium chloride  DVT Prophylaxis  - SCDs ( Lovenox on hold pending venous Doppler)  Lab Results  Component Value Date   PLT 83* 02/22/2015    Antibiotics    Anti-infectives    Start     Dose/Rate Route Frequency Ordered  Stop   02/19/15 1400  piperacillin-tazobactam (ZOSYN) IVPB 3.375 g     3.375 g 12.5 mL/hr over 240 Minutes Intravenous 3 times per day 02/19/15 1009     02/19/15 1200  fluconazole (DIFLUCAN) IVPB 400 mg  Status:  Discontinued     400 mg 100 mL/hr over 120 Minutes Intravenous Every 24 hours 02/18/15 1227 02/19/15 1002   02/18/15 1400  piperacillin-tazobactam (ZOSYN) IVPB 3.375 g  Status:  Discontinued     3.375 g 12.5 mL/hr over 240 Minutes Intravenous Every 8 hours 02/18/15 0731 02/18/15 1227   02/18/15 1400  imipenem-cilastatin (PRIMAXIN) 500 mg in sodium chloride 0.9 % 100 mL IVPB  Status:  Discontinued     500 mg 200 mL/hr over 30 Minutes Intravenous 4 times per day 02/18/15 1237 02/19/15 1002   02/18/15 1300  fluconazole (DIFLUCAN) IVPB 800 mg     800 mg 200 mL/hr over 120 Minutes Intravenous  Once 02/18/15 1227 02/18/15 1618   02/18/15 0800  vancomycin (VANCOCIN) IVPB 750 mg/150 ml premix  Status:  Discontinued     750 mg 150 mL/hr over 60 Minutes Intravenous Every 12 hours 02/18/15 0731 02/19/15 1002   02/18/15 0745  piperacillin-tazobactam (ZOSYN) IVPB 3.375 g     3.375 g 100 mL/hr over 30 Minutes Intravenous  Once 02/18/15 0731 02/18/15 0826          Objective:   Filed Vitals:   02/22/15 0007 02/22/15 0030 02/22/15 0214 02/22/15 0616  BP: 107/67   111/66  Pulse: 116     Temp: 100.7 F (38.2 C)  98.8 F (37.1 C) 98.3 F (36.8 C)  TempSrc: Oral  Oral Oral  Resp: 25   22  Height:      Weight:      SpO2: 93% 95%  97%    Wt Readings from Last 3 Encounters:  02/18/15 74.4 kg (164 lb 0.4 oz)     Intake/Output Summary (Last 24 hours) at 02/22/15 1201 Last data filed at 02/22/15 1051  Gross per 24 hour  Intake    670 ml  Output   3420 ml  Net  -2750 ml     Physical Exam  Awake Alert,  frail, ill-appearing ,Oriented X 3, No new F.N deficits, Normal affect [place].AT,PERRAL Supple Neck,No JVD, No cervical lymphadenopathy appriciated.  Symmetrical Chest wall  movement, Good air movement bilaterally,  Irregular irregular ,No Gallops,Rubs or new Murmurs, No Parasternal Heave +ve B.Sounds, Abd Soft, No tenderness, No organomegaly appriciated, No rebound - guarding or rigidity. No Cyanosis, Clubbing or edema, No new Rash or bruise     Data Review   Micro Results Recent Results (from the past 240 hour(s))  Clostridium Difficile by PCR     Status: None   Collection Time: 02/16/15 10:01 PM  Result Value Ref Range Status   C difficile by pcr NEGATIVE NEGATIVE Final  MRSA PCR Screening  Status: None   Collection Time: 02/18/15  1:30 AM  Result Value Ref Range Status   MRSA by PCR NEGATIVE NEGATIVE Final    Comment:        The GeneXpert MRSA Assay (FDA approved for NASAL specimens only), is one component of a comprehensive MRSA colonization surveillance program. It is not intended to diagnose MRSA infection nor to guide or monitor treatment for MRSA infections.   Urine culture     Status: None   Collection Time: 02/18/15  1:13 PM  Result Value Ref Range Status   Specimen Description URINE, RANDOM  Final   Special Requests Immunocompromised  Final   Colony Count NO GROWTH Performed at [facility]   Final   Culture NO GROWTH Performed at [facility]   Final   Report Status 02/19/2015 FINAL  Final  Culture, respiratory (NON-Expectorated)     Status: None   Collection Time: 02/18/15  1:20 PM  Result Value Ref Range Status   Specimen Description ENDOTRACHEAL  Final   Special Requests NONE  Final   Gram Stain   Final    ABUNDANT WBC PRESENT,BOTH PMN AND MONONUCLEAR RARE SQUAMOUS EPITHELIAL CELLS PRESENT FEW GRAM POSITIVE COCCI IN PAIRS RARE YEAST Performed at [facility]    Culture   Final    MODERATE ESCHERICHIA COLI MODERATE CANDIDA ALBICANS Performed at [facility]    Report Status 02/21/2015 FINAL  Final   Organism ID, Bacteria ESCHERICHIA COLI  Final      Susceptibility   Escherichia  coli - MIC*    AMPICILLIN >=32 RESISTANT Resistant     AMPICILLIN/SULBACTAM 8 SENSITIVE Sensitive     CEFAZOLIN <=4 SENSITIVE Sensitive     CEFEPIME <=1 SENSITIVE Sensitive     CEFTAZIDIME <=1 SENSITIVE Sensitive     CEFTRIAXONE <=1 SENSITIVE Sensitive     CIPROFLOXACIN <=0.25 SENSITIVE Sensitive     GENTAMICIN <=1 SENSITIVE Sensitive     IMIPENEM <=0.25 SENSITIVE Sensitive     PIP/TAZO <=4 SENSITIVE Sensitive     TOBRAMYCIN <=1 SENSITIVE Sensitive     TRIMETH/SULFA <=20 SENSITIVE Sensitive     * MODERATE ESCHERICHIA COLI  Culture, blood (x 2)     Status: None (Preliminary result)   Collection Time: 02/18/15  2:00 PM  Result Value Ref Range Status   Specimen Description BLOOD LEFT ARM  Final   Special Requests BOTTLES DRAWN AEROBIC AND ANAEROBIC 3CC  Final   Culture   Final           BLOOD CULTURE RECEIVED NO GROWTH TO DATE CULTURE WILL BE HELD FOR 5 DAYS BEFORE ISSUING A FINAL NEGATIVE REPORT Performed at [facility]    Report Status PENDING  Incomplete  Culture, blood (x 2)     Status: None (Preliminary result)   Collection Time: 02/18/15  2:07 PM  Result Value Ref Range Status   Specimen Description BLOOD LEFT HAND  Final   Special Requests BOTTLES DRAWN AEROBIC AND ANAEROBIC 4CC  Final   Culture   Final           BLOOD CULTURE RECEIVED NO GROWTH TO DATE CULTURE WILL BE HELD FOR 5 DAYS BEFORE ISSUING A FINAL NEGATIVE REPORT Performed at [facility]    Report Status PENDING  Incomplete  Clostridium Difficile by PCR     Status: None   Collection Time: 02/19/15 10:11 PM  Result Value Ref Range Status   C difficile by pcr NEGATIVE NEGATIVE Final  Radiology Reports Dg Chest Port 1 View  02/22/2015   CLINICAL DATA:  Respiratory failure  EXAM: PORTABLE CHEST - 1 VIEW  COMPARISON:  02/20/2015  FINDINGS: The nasogastric tube is been removed. The left jugular central line extends into the SVC. The right jugular Port-A-Cath extends into the cavoatrial junction.  There is improvement, with slightly better aeration in the bases and improved lung volumes.  IMPRESSION: Slight improvement.   Electronically Signed   By: Andreas Newport M.D.   On: 02/22/2015 06:38    CBC  Recent Labs Lab 02/16/15 1040  02/18/15 1330 02/19/15 0452 02/20/15 0300 02/21/15 0550 02/22/15 0630  WBC 5.6  < > 9.0 6.5 5.0 6.9 8.8  HGB 18.2*  < > 12.5* 12.5* 11.5* 11.0* 10.6*  HCT 47.5  < > 35.1* 34.7* 32.8* 31.9* 31.1*  PLT 185  < > 138* 120* 106* 90* 83*  MCV 78.4  < > 82.0 80.9 82.6 82.4 85.0  MCH 30.0  < > 29.2 29.1 29.0 28.4 29.0  MCHC 38.3*  < > 35.6 36.0 35.1 34.5 34.1  RDW 16.7*  < > 17.3* 17.5* 17.9* 18.1* 18.8*  LYMPHSABS 0.9  --   --   --  0.4*  --   --   MONOABS 2.6*  --   --   --  0.8  --   --   EOSABS 0.0  --   --   --  0.2  --   --   BASOSABS 0.0  --   --   --  0.0  --   --   < > = values in this interval not displayed.  Chemistries   Recent Labs Lab 02/16/15 1040  02/16/15 1815  02/18/15 1115 02/19/15 0452 02/20/15 0300 02/21/15 0550 02/22/15 0630  NA 127*  < >  --   < > 133* 133* 134* 137 141  K 3.3*  < >  --   < > 4.5 3.9 3.3* 2.7* 2.9*  CL 88*  < >  --   < > 104 103 103 102 108  CO2 22  --   --   < > 22 25 27 25 29   GLUCOSE 180*  < >  --   < > 134* 97 129* 124* 111*  BUN 47*  < >  --   < > 30* 19 13 13 17   CREATININE 1.64*  < >  --   < > 0.99 0.82 0.95 0.85 0.89  CALCIUM 9.2  --   --   < > 6.9* 6.8* 7.1* 7.2* 7.6*  MG  --   --  2.4  --   --   --  1.8 1.9 2.0  AST 31  --   --   --   --   --  71* 69*  --   ALT 22  --   --   --   --   --  35 29  --   ALKPHOS 70  --   --   --   --   --  56 62  --   BILITOT 1.4*  --   --   --   --   --  1.1 0.9  --   < > = values in this interval not displayed. ------------------------------------------------------------------------------------------------------------------ estimated creatinine clearance is 82.2 mL/min (by C-G formula based on Cr of  0.89). ------------------------------------------------------------------------------------------------------------------ No results for input(s): HGBA1C in the last 72 hours. ------------------------------------------------------------------------------------------------------------------  Recent Labs  02/20/15 0300  TRIG 262*   ------------------------------------------------------------------------------------------------------------------ No results for input(s): TSH, T4TOTAL, T3FREE, THYROIDAB in the last 72 hours.  Invalid input(s): FREET3 ------------------------------------------------------------------------------------------------------------------ No results for input(s): VITAMINB12, FOLATE, FERRITIN, TIBC, IRON, RETICCTPCT in the last 72 hours.  Coagulation profile No results for input(s): INR, PROTIME in the last 168 hours.  No results for input(s): DDIMER in the last 72 hours.  Cardiac Enzymes  Recent Labs Lab 02/19/15 1030 02/20/15 0300  TROPONINI 0.04* 0.04*   ------------------------------------------------------------------------------------------------------------------ Invalid input(s): POCBNP     Time Spent in minutes   35 minutes   Tanganyika Bowlds M.D on 02/22/2015 at 12:01 PM  Between 7am to 7pm - Pager - [phone]  After 7pm go to [email or web address] - password [email or web address]  And look for the night coverage person covering for me after hours  [facility] Office  [phone]   **Disclaimer: This note may have been dictated with voice recognition software. Similar sounding words can inadvertently be transcribed and this note may contain transcription errors which may not have been corrected upon publication of note.**

## 2015-02-22 NOTE — Progress Notes (Signed)
Spoke with patient and family about ng tube placement. Patient and family would like further clarification before the placement of the ng tube and why the patient needs it. Spoke with the provider on call, M. Lynch NP who said it's ok to hold the ng tube for tonight and pt's medications were switched to iv. Pt and family with no further questions or concerns at this time.

## 2015-02-22 NOTE — Progress Notes (Signed)
PARENTERAL NUTRITION CONSULT NOTE - Follow-up  Pharmacy Consult for TPN Indication: intolerance to enteral nutrition, ileus vs SBO  No Known Allergies  Patient Measurements: Height: 5\' 5"  (165.1 cm) Weight: 164 lb 0.4 oz (74.4 kg) IBW/kg (Calculated) : 61.5  Vital Signs: Temp: 98.3 F (36.8 C) (03/18 0616) Temp Source: Oral (03/18 0616) BP: 111/66 mmHg (03/18 0616) Pulse Rate: 116 (03/18 0007) Intake/Output from previous day: 03/17 0701 - 03/18 0700 In: 1343.3 [I.V.:40; IV Piggyback:753.3; TPN:550] Out: 3570 [Urine:870; Stool:2700] Intake/Output from this shift: Total I/O In: 120 [P.O.:120] Out: 0   Labs:  Recent Labs  02/20/15 0300 02/21/15 0550 02/22/15 0630  WBC 5.0 6.9 8.8  HGB 11.5* 11.0* 10.6*  HCT 32.8* 31.9* 31.1*  PLT 106* 90* 83*     Recent Labs  02/20/15 0300 02/21/15 0550 02/22/15 0630  NA 134* 137 141  K 3.3* 2.7* 2.9*  CL 103 102 108  CO2 27 25 29   GLUCOSE 129* 124* 111*  BUN 13 13 17   CREATININE 0.95 0.85 0.89  CALCIUM 7.1* 7.2* 7.6*  MG 1.8 1.9 2.0  PHOS 1.2* 2.3 2.5  PROT 4.2* 4.4*  --   ALBUMIN 1.5* 1.4*  --   AST 71* 69*  --   ALT 35 29  --   ALKPHOS 56 62  --   BILITOT 1.1 0.9  --   PREALBUMIN 2.8*  --   --   TRIG 262*  --   --    Estimated Creatinine Clearance: 82.2 mL/min (by C-G formula based on Cr of 0.89).    Recent Labs  02/21/15 1708 02/22/15 0005 02/22/15 0615  GLUCAP 100* 109* 109*   Insulin Requirements in the past 24 hours:  0 units SSI  Current Nutrition:  Clinimix E5/15 at 78ml/hr + lipids 20% at 56ml/hr - provides 48gm protein/day (53% goal) and 1162 Kcal/day (61% goal)  Assessment: 72 yom with metastatic colon cancer undergoing chemotherapy, admitted 3/12 with intractable vomiting and diarrhea. CT abdomen shows possible ileus vs early obstruction. Per patients family, he had not eaten x 6 days prior to admission due to N/V.    GI: Colon cancer with liver + lung mets ([place] PTA) - SBO vs ileus - s/p  R hemicolectomy in 12/15. Pt may be at risk for refeeding d/t no PO intake x 7 days prior to TPN initiation. Motility agent (metoclopramide) added. NGT clamped and clear liquids started 3/16, diet advanced to dys 2 3/17 and only taking bites.  2700 mlstool recorded.  Endo: TSH WNL, CBGs good on SSI no insulin needed- continue insulin until TPN is at goal Lytes: Na 141, K 2.9 after 8 runs yest (goal 4),  Mg 2.0 after 1 gm yest (goal 2), Phos 2.5 after  MD supplemented 7mmol of NaPhos - likely some refeeding as pt has not been eating for a week prior to TPN initiation.  Renal: Scr 0.89, UOP 0.8 ml/kg/hr Pulm: Extubated 3/15 - CT negative for PE Cards: EF 45-50% - no meds except prn metoprolol IV - troponin mildly elevated Hepatobil: AST mildly elevated - thrombocytopenia (plts trending down) - triglycerides are elevated at 262  ID: Narrowed to zosyn for aspiration PNA - AF, WBC WNL, cultures NGTD- lactic acid + PCT trending down Best Practices: lovenox, MC TPN Access: CVC L IJ TPN day#: 4 (3/15>> )  Nutritional Goals: (per RD 3/15) 1900-2200 kCal/day 90-110 grams of protein/day  Plan:  - Continue Clinimix E5/15 at 24mL/hr - will not advance until electrolytes are  better controlled d/t possible refeeding syndrome - k for a total of 8 runs - Continue Lipids 20% at 73ml/hr - Continue SSI at least until TPN is at goal rate to assess glucose control with full nutrition - F/u AM labs  -f/u diet advancement, on dys 2 currently only taking bites -? Consider stopping IV reglan with 2700 ml stool output  Eudelia Bunch, Pharm.D. [phone] 02/22/2015 11:47 AM

## 2015-02-22 NOTE — Progress Notes (Addendum)
PULMONARY / CRITICAL CARE MEDICINE   Name: Benjamin Blake MRN: [id number] DOB: 1951/05/08    ADMISSION DATE:  02/16/2015 CONSULTATION DATE:  02/18/2015  REFERRING MD : edp  CHIEF COMPLAINT:  hypoxia  INITIAL PRESENTATION:  24 M with metastatic colon cancer (undergoing chemotherapy) admitted 3/12 by [facility] for intractable vomiting and diarrhea. Developed respiratory distress 3/14 due to apparent aspiration. Transferred to ICU, PCCM consult, intubated  INDWELLING DEVICES:: ETT 3/14 >> 3/15 R IJ CVL 3/14 >>   MICRO DATA: MRSA PCR 3/12 >> NEG C diff 3/12 >> NEG CDiff 3/15>>>NEG Blood 3/14 >>  Resp 3/14 >> moderate e coli (pansens) Urine 3/14>>> neg   ANTIMICROBIALS:  Vanc 3/14 >> 3/15 Imipenem 3/14 >> 3/15 Pip-tazo 3/15 >>     STUDIES/SIGNIFICANT EVENTS:  3/14 CT chest:  Negative for pulmonary embolism. Multifocal airspace opacities in both lungs, possibly due to infectious infiltrates. Pulmonary hemorrhages could also have this appearance.  Low-attenuation liver lesions, not simple cysts. These could represent metastases. 3/14 CTAP: Postsurgical changes in the right upper quadrant consistent with the small bowel to transverse colon anastomosis. The anastomosis appears patent although some mild inflammatory changes are noted within the mesenteric and small bowel dilatation is seen. It is uncertain whether this represents a partial small bowel obstruction or ileus  3/14 transient AFRVR. Converted with amiodarone 3/15 passed SBT. Cognition intact. Extubated and tolerating. TPN initiated. Motility agent initiated 3/17 transfer to telemetry, NGT removed. Advance diet. No new complaints.tolerating extubation. Ambulating with assistance. Has not required NPPV. RN notes cough after PO intake    SUBJECTIVE/OVERNIGHT/INTERVAL HX 02/22/15: On face maskl - Wife says this is due to Deviated nasal septum. CXR better. Want to know dc date. Deconditioned. In bed. Feelign better overall. No active  complaints. On swallow restrictions. Zosyn continues  VITAL SIGNS: Temp:  [98.3 F (36.8 C)-100.7 F (38.2 C)] 98.3 F (36.8 C) (03/18 0616) Pulse Rate:  [114-125] 116 (03/18 0007) Resp:  [22-35] 22 (03/18 0616) BP: (99-111)/(58-68) 111/66 mmHg (03/18 0616) SpO2:  [93 %-98 %] 97 % (03/18 0616) HEMODYNAMICS:   VENTILATOR SETTINGS:   INTAKE / OUTPUT:  Intake/Output Summary (Last 24 hours) at 02/22/15 1224 Last data filed at 02/22/15 1215  Gross per 24 hour  Intake    670 ml  Output   3670 ml  Net  -3000 ml    PHYSICAL EXAMINATION: General:  Deconditioned male Neuro: no focal deficits HEENT:  NCAT Cardiovascular: Reg, no M Lungs:  Scattered rhonchi - very faint to non existent. No resp distress Abdomen: Soft. NT, diminished BS Ext: Intact. No edema.   LABS: PULMONARY  Recent Labs Lab 02/16/15 1051 02/17/15 2055 02/18/15 1114 02/19/15 0427  PHART  --  7.402 7.302* 7.560*  PCO2ART  --  28.5* 35.4 23.7*  PO2ART  --  63.8* 80.0 160.0*  HCO3  --  17.3* 17.5* 21.2  TCO2 18 18.2 19 22   O2SAT  --  91.3 95.0 100.0    CBC  Recent Labs Lab 02/20/15 0300 02/21/15 0550 02/22/15 0630  HGB 11.5* 11.0* 10.6*  HCT 32.8* 31.9* 31.1*  WBC 5.0 6.9 8.8  PLT 106* 90* 83*    COAGULATION No results for input(s): INR in the last 168 hours.  CARDIAC   Recent Labs Lab 02/19/15 1030 02/20/15 0300  TROPONINI 0.04* 0.04*   No results for input(s): PROBNP in the last 168 hours.   CHEMISTRY  Recent Labs Lab 02/16/15 1815  02/18/15 1115 02/19/15 0452 02/20/15 0300 02/21/15 0550 02/22/15  0630  NA  --   < > 133* 133* 134* 137 141  K  --   < > 4.5 3.9 3.3* 2.7* 2.9*  CL  --   < > 104 103 103 102 108  CO2  --   < > 22 25 27 25 29   GLUCOSE  --   < > 134* 97 129* 124* 111*  BUN  --   < > 30* 19 13 13 17   CREATININE  --   < > 0.99 0.82 0.95 0.85 0.89  CALCIUM  --   < > 6.9* 6.8* 7.1* 7.2* 7.6*  MG 2.4  --   --   --  1.8 1.9 2.0  PHOS  --   --   --   --  1.2* 2.3  2.5  < > = values in this interval not displayed. Estimated Creatinine Clearance: 82.2 mL/min (by C-G formula based on Cr of 0.89).   LIVER  Recent Labs Lab 02/16/15 1040 02/20/15 0300 02/21/15 0550  AST 31 71* 69*  ALT 22 35 29  ALKPHOS 70 56 62  BILITOT 1.4* 1.1 0.9  PROT 6.8 4.2* 4.4*  ALBUMIN 3.2* 1.5* 1.4*     INFECTIOUS  Recent Labs Lab 02/17/15 2130 02/18/15 1539 02/19/15 0452 02/19/15 1030 02/20/15 0300 02/21/15 0550  LATICACIDVEN 4.7* 3.4* 2.6*  --   --   --   PROCALCITON  --   --   --  16.16 10.33 6.71     ENDOCRINE CBG (last 3)   Recent Labs  02/22/15 0005 02/22/15 0615 02/22/15 1205  GLUCAP 109* 109* 111*         IMAGING x48h Dg Chest Port 1 View  02/22/2015   CLINICAL DATA:  Respiratory failure  EXAM: PORTABLE CHEST - 1 VIEW  COMPARISON:  02/20/2015  FINDINGS: The nasogastric tube is been removed. The left jugular central line extends into the SVC. The right jugular Port-A-Cath extends into the cavoatrial junction. There is improvement, with slightly better aeration in the bases and improved lung volumes.  IMPRESSION: Slight improvement.   Electronically Signed   By: Andreas Newport M.D.   On: 02/22/2015 06:38         ASSESSMENT / PLAN:  PULMONARY A: Acute respiratory failure Aspiration PNA, E coli   - improving.  P:   Supp O2 as needed Airway hygiene Follow CXR intermittently  CARDIOVASCULAR A:  PAF > sinus tach - improved on metoprolol (changed to IV 3/17) Hypotension, resolved P:  Cont metoprolol - change to PO when able Monitor BP and rhythm  RENAL A:   AKI, resolved Hyponatremia, mild Metabolic acidosis, resolved  Hypovolemia, resolved   - Hypokalemia - repleted P:   Monitor BMET intermittently Monitor I/Os Correct electrolytes as indicated  GASTROINTESTINAL A:   Metastatic colon cancer Ileus, doubt SBO or bowel ischemia Intractable N/V Diarrhea - cdiff neg 3/12 and 3/16 Protein calorie  malnutrition - very poor PO intake X > 1 week   - on swallow restriction  P:   DC NG tube 3/17 Advance diet as able Cont TPN - DC when able to meet nutritional needs by mouth Cont metoclopramide   PCCM Discussed with Dr Clelia Croft, his oncologist @ [facility] 3/15  Further chemo decisions to be made after discharge  Please ensure that a DC summary is directed to Dr Reynaldo Minium @ [facility] 02/20/15 0300 02/21/15 0550 02/22/15 0630  HGB 11.5* 11.0* 10.6*  HCT 32.8* 31.9*  31.1*  WBC 5.0 6.9 8.8  PLT 106* 90* 83*    A:   Mild anemia Thrombocytopenia - 5 days - low prob for HIT  P:  DVT px:  DC lovenox and check HIT panel and DUplex LE [place] Do SCD Ok to restart lovenox dvt proph if no DVT but if DVT - start direct thrombin inhibitor Monitor CBC intermittently Transfuse per usual guidelines  INFECTIOUS A:   E colii pna andSevere sepsis Immunocompromised host P: Monitor temp, WBC count Micro and abx as above Narrow zosyn to ceftriaxone for total 8 days from 02/16/2015  ENDOCRINE A:   Risk of hyperglycemia on TPN P:   Cont SSI while on TPN  NEUROLOGIC A:  Abd pain ICU associated anxiety Deconditioning P:   RASS goal: 0 Low dose PRN fentanyl Cont PT    FAMILY: Pt and wife updated 3/19.   Marland Kitchen TRH to resume primary duties in AM 3/18. PCCM signing off. DC home depends on rehab needs    Dr. Brand Males, M.D., [facility].C.P Pulmonary and Critical Care Medicine Staff Physician [facility] Pager: [phone], If no answer or between  15:00h - 7:00h: call [phone]  02/22/2015 12:31 PM

## 2015-02-23 ENCOUNTER — Inpatient Hospital Stay (HOSPITAL_COMMUNITY): Payer: BLUE CROSS/BLUE SHIELD

## 2015-02-23 DIAGNOSIS — N179 Acute kidney failure, unspecified: Secondary | ICD-10-CM

## 2015-02-23 DIAGNOSIS — I4891 Unspecified atrial fibrillation: Secondary | ICD-10-CM

## 2015-02-23 LAB — CBC
HCT: 31.7 % — ABNORMAL LOW (ref 39.0–52.0)
Hemoglobin: 10.5 g/dL — ABNORMAL LOW (ref 13.0–17.0)
MCH: 28 pg (ref 26.0–34.0)
MCHC: 33.1 g/dL (ref 30.0–36.0)
MCV: 84.5 fL (ref 78.0–100.0)
Platelets: 100 10*3/uL — ABNORMAL LOW (ref 150–400)
RBC: 3.75 MIL/uL — ABNORMAL LOW (ref 4.22–5.81)
RDW: 19.2 % — ABNORMAL HIGH (ref 11.5–15.5)
WBC: 8.3 10*3/uL (ref 4.0–10.5)

## 2015-02-23 LAB — BASIC METABOLIC PANEL
Anion gap: 6 (ref 5–15)
BUN: 19 mg/dL (ref 6–23)
CO2: 24 mmol/L (ref 19–32)
Calcium: 7.7 mg/dL — ABNORMAL LOW (ref 8.4–10.5)
Chloride: 110 mmol/L (ref 96–112)
Creatinine, Ser: 0.83 mg/dL (ref 0.50–1.35)
GFR calc Af Amer: >90 mL/min (ref >90)
GFR calc non Af Amer: >90 mL/min (ref >90)
GLUCOSE: 116 mg/dL — AB (ref 70–99)
Potassium: 3.5 mmol/L (ref 3.5–5.1)
Sodium: 140 mmol/L (ref 135–145)

## 2015-02-23 LAB — GLUCOSE, CAPILLARY
GLUCOSE-CAPILLARY: 101 mg/dL — AB (ref 70–99)
GLUCOSE-CAPILLARY: 96 mg/dL (ref 70–99)
GLUCOSE-CAPILLARY: 107 mg/dL — AB (ref 70–99)
Glucose-Capillary: 148 mg/dL — ABNORMAL HIGH (ref 70–99)

## 2015-02-23 LAB — MAGNESIUM: Magnesium: 1.9 mg/dL (ref 1.5–2.5)

## 2015-02-23 LAB — HEPARIN INDUCED THROMBOCYTOPENIA PNL: Heparin Induced Plt Ab: 0.168 OD (ref 0.000–0.400)

## 2015-02-23 MED ORDER — JEVITY 1.2 CAL PO LIQD
1000.0000 mL | ORAL | Status: DC
  Administered 2015-02-23 – 2015-02-24 (×3): 1000 mL
  Filled 2015-02-23 (×4): qty 1000

## 2015-02-23 MED ORDER — MAGNESIUM SULFATE 2 GM/50ML IV SOLN
2.0000 g | Freq: Once | INTRAVENOUS | Status: AC
  Administered 2015-02-23: 2 g via INTRAVENOUS
  Filled 2015-02-23: qty 50

## 2015-02-23 MED ORDER — LORAZEPAM 2 MG/ML IJ SOLN
0.5000 mg | Freq: Once | INTRAMUSCULAR | Status: AC
  Administered 2015-02-23: 0.5 mg via INTRAVENOUS
  Filled 2015-02-23: qty 1

## 2015-02-23 MED ORDER — OSMOLITE 1.5 CAL PO LIQD
1000.0000 mL | ORAL | Status: DC
  Filled 2015-02-23 (×2): qty 1000

## 2015-02-23 MED ORDER — POTASSIUM CHLORIDE 10 MEQ/50ML IV SOLN
10.0000 meq | INTRAVENOUS | Status: AC
  Administered 2015-02-23 (×3): 10 meq via INTRAVENOUS
  Filled 2015-02-23 (×3): qty 50

## 2015-02-23 MED ORDER — CLINIMIX E/DEXTROSE (5/15) 5 % IV SOLN
INTRAVENOUS | Status: DC
  Filled 2015-02-23: qty 1440

## 2015-02-23 MED ORDER — METOPROLOL TARTRATE 25 MG/10 ML ORAL SUSPENSION
25.0000 mg | Freq: Four times a day (QID) | ORAL | Status: DC
Start: 2015-02-23 — End: 2015-02-26
  Administered 2015-02-23 – 2015-02-26 (×10): 25 mg
  Filled 2015-02-23 (×16): qty 10

## 2015-02-23 MED ORDER — ASPIRIN 81 MG PO CHEW
81.0000 mg | CHEWABLE_TABLET | Freq: Every day | ORAL | Status: DC
  Administered 2015-02-24 – 2015-02-27 (×4): 81 mg
  Filled 2015-02-23 (×4): qty 1

## 2015-02-23 MED ORDER — GUAIFENESIN 100 MG/5ML PO SYRP
1200.0000 mg | ORAL_SOLUTION | Freq: Two times a day (BID) | ORAL | Status: DC
  Administered 2015-02-23 (×2): 1200 mg
  Filled 2015-02-23 (×4): qty 60

## 2015-02-23 MED ORDER — FAT EMULSION 20 % IV EMUL
240.0000 mL | INTRAVENOUS | Status: DC
  Filled 2015-02-23: qty 250

## 2015-02-23 MED ORDER — FAT EMULSION 20 % IV EMUL
240.0000 mL | INTRAVENOUS | Status: AC
  Administered 2015-02-23: 240 mL via INTRAVENOUS
  Filled 2015-02-23: qty 250

## 2015-02-23 MED ORDER — ENOXAPARIN SODIUM 40 MG/0.4ML ~~LOC~~ SOLN
40.0000 mg | SUBCUTANEOUS | Status: DC
  Administered 2015-02-24 – 2015-02-27 (×4): 40 mg via SUBCUTANEOUS
  Filled 2015-02-23 (×4): qty 0.4

## 2015-02-23 MED ORDER — HEPARIN SOD (PORK) LOCK FLUSH 100 UNIT/ML IV SOLN
500.0000 [IU] | INTRAVENOUS | Status: DC
  Administered 2015-02-23: 500 [IU]

## 2015-02-23 MED ORDER — TRACE MINERALS CR-CU-F-FE-I-MN-MO-SE-ZN IV SOLN
INTRAVENOUS | Status: AC
  Administered 2015-02-23: 18:00:00 via INTRAVENOUS
  Filled 2015-02-23: qty 1440

## 2015-02-23 MED ORDER — HEPARIN SOD (PORK) LOCK FLUSH 100 UNIT/ML IV SOLN
500.0000 [IU] | INTRAVENOUS | Status: AC | PRN
  Administered 2015-02-27: 500 [IU]

## 2015-02-23 MED ORDER — HEPARIN SOD (PORK) LOCK FLUSH 100 UNIT/ML IV SOLN: 500.0000 [IU] | INTRAVENOUS | Status: DC | PRN

## 2015-02-23 NOTE — Progress Notes (Signed)
Patient Demographics  Benjamin Blake, is a 64 y.o. male, DOB - Aug 23, 1951, PJK:[id number]  Admit date - 02/16/2015   Admitting Physician Allie Bossier, MD  Outpatient Primary MD for the patient is Default, Provider, MD  LOS - 6   Chief Complaint  Patient presents with  . Dehydration      Admission history of present illness/brief narrative: Benjamin Blake is a 64 y.o. male, who just moved to the area from [place].He has metastatic colon cancer and presents to our ER for intractable vomiting and diarrhea. His colon cancer was originally diagnosed in 2010. He underwent surgery at that time. Unfortunately they found a recurrence in 2015 with metastases to the liver, peritoneum, and lung. He is undergoing chemotherapy at [facility]. His oncologist is Dr. Reynaldo Minium. His last chemotherapy treatment was 01/31/15. He was feeling fairly well until Thursday 3/10. Developed respiratory distress 3/14 due to apparent aspiration. Transferred to ICU, PCCM consult, intubated on 3/14, patient was extubated on 3/15 , was on TPN, NGT was discontinued on 3/17, at the swallow evaluation, advanced to dysphagia 2 with thick liquid.  Subjective:   [name] today has, No headache, No chest pain, No abdominal pain - No Nausea, still having diarrhea, reports cough,  Assessment & Plan    Active Problems:   AKI (acute kidney injury)   Hypokalemia   Hyponatremia   Nausea with vomiting   Colon cancer, metastatic   Acute respiratory failure   Aspiration into airway   Abdominal pain   A-fib  Acute hypoxic respiratory failure - Secondary to aspiration pneumonia, post extubation 3/14, extubated on 3/15 - Currently on nasal cannula, wean as tolerated. - Pulmonary toilet.  Aspiration pneumonia - On IV Zosyn 3/12, transitioned to Rocephin 3/18, will stop on 3/20. - Sputum culture growing  Escherichia coli.   Transient Atrial fibrillation/ sinus tachycardia - New onset, Chadsvasc 2 score <2 , will start aspirin when more stable. - cardiology consult appreciated - IV metoprolol changed to oral metoprolol.   diarrhea - C. difficile negative 2, most likely related to chemotherapy. - DC IV Reglan, C. difficile negative, once patient tolerated oral intake, will start on Imodium.  Protein calorie malnutrition - Continue with TPN, when tolerating oral intake will DC TPN  Hypokalemia - Replaced per pharmacy part of TPN management.  Thrombocytopenia - Most likely related to sepsis.  - HIT antibody pending - Continue to monitor closely, - No evidence of DVT on venous Doppler.  Metastatic colon cancer - patientreceived chemotherapy at [facility], further chemotherapy decisions by [facility] after discharge.   Code Status: Full  Family Communication: family at bedside  Disposition Plan: pending further workup   Procedures  Intubated /14, extubated 3/15   Consults   PCCM   Medications  Scheduled Meds: . antiseptic oral rinse  7 mL Mouth Rinse q12n4p  . cefTRIAXone (ROCEPHIN)  IV  1 g Intravenous Q24H  . chlorhexidine  15 mL Mouth Rinse BID  . [START ON 02/24/2015] enoxaparin (LOVENOX) injection  40 mg Subcutaneous Q24H  . guaifenesin  1,200 mg Per Tube BID  . insulin aspart  0-9 Units Subcutaneous Q6H  . metoprolol tartrate  25 mg Per Tube Q6H  . scopolamine  1 patch Transdermal Q72H  .  sodium chloride  3 mL Intravenous Q12H   Continuous Infusions: . Marland KitchenTPN (CLINIMIX-E) Adult 40 mL/hr at 02/22/15 1815   And  . fat emulsion 240 mL (02/22/15 1815)  . Marland KitchenTPN (CLINIMIX-E) Adult     And  . fat emulsion     PRN Meds:.fentaNYL, heparin lock flush, ondansetron (ZOFRAN) IV, RESOURCE THICKENUP CLEAR, sodium chloride  DVT Prophylaxis  - SCDs ( Lovenox on hold pending venous Doppler)  Lab Results  Component Value Date   PLT 100* 02/23/2015    Antibiotics     Anti-infectives    Start     Dose/Rate Route Frequency Ordered Stop   02/22/15 1400  cefTRIAXone (ROCEPHIN) 1 g in dextrose 5 % 50 mL IVPB - Premix     1 g 100 mL/hr over 30 Minutes Intravenous Every 24 hours 02/22/15 1313     02/19/15 1400  piperacillin-tazobactam (ZOSYN) IVPB 3.375 g  Status:  Discontinued     3.375 g 12.5 mL/hr over 240 Minutes Intravenous 3 times per day 02/19/15 1009 02/22/15 1232   02/19/15 1200  fluconazole (DIFLUCAN) IVPB 400 mg  Status:  Discontinued     400 mg 100 mL/hr over 120 Minutes Intravenous Every 24 hours 02/18/15 1227 02/19/15 1002   02/18/15 1400  piperacillin-tazobactam (ZOSYN) IVPB 3.375 g  Status:  Discontinued     3.375 g 12.5 mL/hr over 240 Minutes Intravenous Every 8 hours 02/18/15 0731 02/18/15 1227   02/18/15 1400  imipenem-cilastatin (PRIMAXIN) 500 mg in sodium chloride 0.9 % 100 mL IVPB  Status:  Discontinued     500 mg 200 mL/hr over 30 Minutes Intravenous 4 times per day 02/18/15 1237 02/19/15 1002   02/18/15 1300  fluconazole (DIFLUCAN) IVPB 800 mg     800 mg 200 mL/hr over 120 Minutes Intravenous  Once 02/18/15 1227 02/18/15 1618   02/18/15 0800  vancomycin (VANCOCIN) IVPB 750 mg/150 ml premix  Status:  Discontinued     750 mg 150 mL/hr over 60 Minutes Intravenous Every 12 hours 02/18/15 0731 02/19/15 1002   02/18/15 0745  piperacillin-tazobactam (ZOSYN) IVPB 3.375 g     3.375 g 100 mL/hr over 30 Minutes Intravenous  Once 02/18/15 0731 02/18/15 0826          Objective:   Filed Vitals:   02/22/15 2346 02/22/15 2350 02/23/15 0538 02/23/15 1406  BP: 115/66  103/65 115/68  Pulse: 114 101 104 114  Temp:   98.1 F (36.7 C) 98.9 F (37.2 C)  TempSrc:   Oral   Resp:   18 18  Height:      Weight:      SpO2: 94%  97% 91%    Wt Readings from Last 3 Encounters:  02/18/15 74.4 kg (164 lb 0.4 oz)     Intake/Output Summary (Last 24 hours) at 02/23/15 1516 Last data filed at 02/23/15 1340  Gross per 24 hour  Intake     20  ml  Output   1700 ml  Net  -1680 ml     Physical Exam  Awake Alert,  frail, ill-appearing ,Oriented X 3, No new F.N deficits, Normal affect Benjamin Blake.AT,PERRAL Supple Neck,No JVD, No cervical lymphadenopathy appriciated.  Symmetrical Chest wall movement, Good air movement bilaterally,  Irregular irregular ,No Gallops,Rubs or new Murmurs, No Parasternal Heave +ve B.Sounds, Abd Soft, No tenderness, No organomegaly appriciated, No rebound - guarding or rigidity. No Cyanosis, Clubbing or edema, No new Rash or bruise     Data Review   Micro Results  Recent Results (from the past 240 hour(s))  Clostridium Difficile by PCR     Status: None   Collection Time: 02/16/15 10:01 PM  Result Value Ref Range Status   C difficile by pcr NEGATIVE NEGATIVE Final  MRSA PCR Screening     Status: None   Collection Time: 02/18/15  1:30 AM  Result Value Ref Range Status   MRSA by PCR NEGATIVE NEGATIVE Final    Comment:        The GeneXpert MRSA Assay (FDA approved for NASAL specimens only), is one component of a comprehensive MRSA colonization surveillance program. It is not intended to diagnose MRSA infection nor to guide or monitor treatment for MRSA infections.   Urine culture     Status: None   Collection Time: 02/18/15  1:13 PM  Result Value Ref Range Status   Specimen Description URINE, RANDOM  Final   Special Requests Immunocompromised  Final   Colony Count NO GROWTH Performed at [facility]   Final   Culture NO GROWTH Performed at [facility]   Final   Report Status 02/19/2015 FINAL  Final  Culture, respiratory (NON-Expectorated)     Status: None   Collection Time: 02/18/15  1:20 PM  Result Value Ref Range Status   Specimen Description ENDOTRACHEAL  Final   Special Requests NONE  Final   Gram Stain   Final    ABUNDANT WBC PRESENT,BOTH PMN AND MONONUCLEAR RARE SQUAMOUS EPITHELIAL CELLS PRESENT FEW GRAM POSITIVE COCCI IN PAIRS RARE YEAST Performed at [facility]    Culture   Final    MODERATE ESCHERICHIA COLI MODERATE CANDIDA ALBICANS Performed at [facility]    Report Status 02/21/2015 FINAL  Final   Organism ID, Bacteria ESCHERICHIA COLI  Final      Susceptibility   Escherichia coli - MIC*    AMPICILLIN >=32 RESISTANT Resistant     AMPICILLIN/SULBACTAM 8 SENSITIVE Sensitive     CEFAZOLIN <=4 SENSITIVE Sensitive     CEFEPIME <=1 SENSITIVE Sensitive     CEFTAZIDIME <=1 SENSITIVE Sensitive     CEFTRIAXONE <=1 SENSITIVE Sensitive     CIPROFLOXACIN <=0.25 SENSITIVE Sensitive     GENTAMICIN <=1 SENSITIVE Sensitive     IMIPENEM <=0.25 SENSITIVE Sensitive     PIP/TAZO <=4 SENSITIVE Sensitive     TOBRAMYCIN <=1 SENSITIVE Sensitive     TRIMETH/SULFA <=20 SENSITIVE Sensitive     * MODERATE ESCHERICHIA COLI  Culture, blood (x 2)     Status: None (Preliminary result)   Collection Time: 02/18/15  2:00 PM  Result Value Ref Range Status   Specimen Description BLOOD LEFT ARM  Final   Special Requests BOTTLES DRAWN AEROBIC AND ANAEROBIC 3CC  Final   Culture   Final           BLOOD CULTURE RECEIVED NO GROWTH TO DATE CULTURE WILL BE HELD FOR 5 DAYS BEFORE ISSUING A FINAL NEGATIVE REPORT Performed at [facility]    Report Status PENDING  Incomplete  Culture, blood (x 2)     Status: None (Preliminary result)   Collection Time: 02/18/15  2:07 PM  Result Value Ref Range Status   Specimen Description BLOOD LEFT HAND  Final   Special Requests BOTTLES DRAWN AEROBIC AND ANAEROBIC 4CC  Final   Culture   Final           BLOOD CULTURE RECEIVED NO GROWTH TO DATE CULTURE WILL BE HELD FOR 5 DAYS BEFORE ISSUING A FINAL  NEGATIVE REPORT Performed at [facility]    Report Status PENDING  Incomplete  Clostridium Difficile by PCR     Status: None   Collection Time: 02/19/15 10:11 PM  Result Value Ref Range Status   C difficile by pcr NEGATIVE NEGATIVE Final    Radiology Reports Dg Chest Port 1 View  02/22/2015   CLINICAL  DATA:  Respiratory failure  EXAM: PORTABLE CHEST - 1 VIEW  COMPARISON:  02/20/2015  FINDINGS: The nasogastric tube is been removed. The left jugular central line extends into the SVC. The right jugular Port-A-Cath extends into the cavoatrial junction. There is improvement, with slightly better aeration in the bases and improved lung volumes.  IMPRESSION: Slight improvement.   Electronically Signed   By: Andreas Newport M.D.   On: 02/22/2015 06:38   Dg Abd Portable 1v  02/23/2015   CLINICAL DATA:  Nasogastric tube placement.  EXAM: PORTABLE ABDOMEN - 1 VIEW  COMPARISON:  02/20/2015  FINDINGS: Nasogastric tube passes well below the diaphragm to have its tip in the distal stomach or possibly first portion of the duodenum.  There are few mildly prominent loops of small bowel most evident in the left quadrant with less dilation than noted on the CT from 02/18/2015. Bowel anastomosis staples are noted in the right quadrant, stable. Soft tissues are otherwise unremarkable. No gross free air on this supine study.  IMPRESSION: Nasogastric tube well positioned.   Electronically Signed   By: Lajean Manes M.D.   On: 02/23/2015 14:25    CBC  Recent Labs Lab 02/19/15 0452 02/20/15 0300 02/21/15 0550 02/22/15 0630 02/23/15 0505  WBC 6.5 5.0 6.9 8.8 8.3  HGB 12.5* 11.5* 11.0* 10.6* 10.5*  HCT 34.7* 32.8* 31.9* 31.1* 31.7*  PLT 120* 106* 90* 83* 100*  MCV 80.9 82.6 82.4 85.0 84.5  MCH 29.1 29.0 28.4 29.0 28.0  MCHC 36.0 35.1 34.5 34.1 33.1  RDW 17.5* 17.9* 18.1* 18.8* 19.2*  LYMPHSABS  --  0.4*  --   --   --   MONOABS  --  0.8  --   --   --   EOSABS  --  0.2  --   --   --   BASOSABS  --  0.0  --   --   --     Chemistries   Recent Labs Lab 02/16/15 1815  02/19/15 0452 02/20/15 0300 02/21/15 0550 02/22/15 0630 02/23/15 0505  NA  --   < > 133* 134* 137 141 140  K  --   < > 3.9 3.3* 2.7* 2.9* 3.5  CL  --   < > 103 103 102 108 110  CO2  --   < > 25 27 25 29 24   GLUCOSE  --   < > 97 129* 124*  111* 116*  BUN  --   < > 19 13 13 17 19   CREATININE  --   < > 0.82 0.95 0.85 0.89 0.83  CALCIUM  --   < > 6.8* 7.1* 7.2* 7.6* 7.7*  MG 2.4  --   --  1.8 1.9 2.0 1.9  AST  --   --   --  71* 69*  --   --   ALT  --   --   --  35 29  --   --   ALKPHOS  --   --   --  56 62  --   --   BILITOT  --   --   --  1.1 0.9  --   --   < > = values in this interval not displayed. ------------------------------------------------------------------------------------------------------------------ estimated creatinine clearance is 88.2 mL/min (by C-G formula based on Cr of 0.83). ------------------------------------------------------------------------------------------------------------------ No results for input(s): HGBA1C in the last 72 hours. ------------------------------------------------------------------------------------------------------------------ No results for input(s): CHOL, HDL, LDLCALC, TRIG, CHOLHDL, LDLDIRECT in the last 72 hours. ------------------------------------------------------------------------------------------------------------------ No results for input(s): TSH, T4TOTAL, T3FREE, THYROIDAB in the last 72 hours.  Invalid input(s): FREET3 ------------------------------------------------------------------------------------------------------------------ No results for input(s): VITAMINB12, FOLATE, FERRITIN, TIBC, IRON, RETICCTPCT in the last 72 hours.  Coagulation profile No results for input(s): INR, PROTIME in the last 168 hours.  No results for input(s): DDIMER in the last 72 hours.  Cardiac Enzymes  Recent Labs Lab 02/19/15 1030 02/20/15 0300  TROPONINI 0.04* 0.04*   ------------------------------------------------------------------------------------------------------------------ Invalid input(s): POCBNP     Time Spent in minutes   35 minutes   Simon Llamas M.D on 02/23/2015 at 3:16 PM  Between 7am to 7pm - Pager - [phone]  After 7pm go to  [email or web address] - password [email or web address]  And look for the night coverage person covering for me after hours  [facility] Office  [phone]   **Disclaimer: This note may have been dictated with voice recognition software. Similar sounding words can inadvertently be transcribed and this note may contain transcription errors which may not have been corrected upon publication of note.**

## 2015-02-23 NOTE — Progress Notes (Addendum)
PARENTERAL NUTRITION CONSULT NOTE - Follow-up  Pharmacy Consult for TPN Indication: intolerance to enteral nutrition, ileus vs SBO  No Known Allergies  Patient Measurements: Height: 5\' 5"  (165.1 cm) Weight: 164 lb 0.4 oz (74.4 kg) IBW/kg (Calculated) : 61.5  Vital Signs: Temp: 98.1 F (36.7 C) (03/19 0538) Temp Source: Oral (03/19 0538) BP: 103/65 mmHg (03/19 0538) Pulse Rate: 104 (03/19 0538) Intake/Output from previous day: 03/18 0701 - 03/19 0700 In: 120 [P.O.:120] Out: 1950 [Urine:650; Stool:1300] Intake/Output from this shift:    Labs:  Recent Labs  02/21/15 0550 02/22/15 0630 02/23/15 0505  WBC 6.9 8.8 8.3  HGB 11.0* 10.6* 10.5*  HCT 31.9* 31.1* 31.7*  PLT 90* 83* 100*     Recent Labs  02/21/15 0550 02/22/15 0630 02/23/15 0505  NA 137 141 140  K 2.7* 2.9* 3.5  CL 102 108 110  CO2 25 29 24   GLUCOSE 124* 111* 116*  BUN 13 17 19   CREATININE 0.85 0.89 0.83  CALCIUM 7.2* 7.6* 7.7*  MG 1.9 2.0  --   PHOS 2.3 2.5  --   PROT 4.4*  --   --   ALBUMIN 1.4*  --   --   AST 69*  --   --   ALT 29  --   --   ALKPHOS 62  --   --   BILITOT 0.9  --   --    Estimated Creatinine Clearance: 88.2 mL/min (by C-G formula based on Cr of 0.83).    Recent Labs  02/22/15 1929 02/22/15 2344 02/23/15 0513  GLUCAP 92 97 101*   Insulin Requirements in the past 24 hours:  0 units SSI  Current Nutrition:  Clinimix E5/15 at 63ml/hr + lipids 20% at 28ml/hr - provides 48gm protein/day (53% goal) and 1162 Kcal/day (61% goal) Diet changed from dys 2 to NPO 3/18 2nd failed FEES study  Assessment: 17 yom with metastatic colon cancer undergoing chemotherapy, admitted 3/12 with intractable vomiting and diarrhea. CT abdomen shows possible ileus vs early obstruction. Per patients family, he had not eaten x 6 days prior to admission due to N/V.    GI: Colon cancer with liver + lung mets ([place] PTA) - SBO vs ileus - s/p R hemicolectomy in 12/15. Pt admitted w/ intractable N/V.   Pt at risk for refeeding d/t no PO intake x 7 days prior to TPN initiation. NG clamped and clear liquids started 3/16, diet advanced to dys 2 3/17. NGT out 3/17.  Failed FEES study 3/18 and made NPO again.  NG re-ordered but not placed yet until family speaks with MD.  IV reglan stopped 3/18.  Stool output 2700 mls down to 1300 mls last 24 hours.   Endo: TSH WNL, CBGs good on SSI no insulin needed- continue SSI until TPN is at goal Lytes: Na 140, K 3.5 after 8 runs yest (goal 4),  Mag  1.9 (goal 2)  Likely some refeeding as pt has not been eating for a week prior to TPN initiation.  Renal: Scr 0.83, UOP 0.8 ml/kg/hr Pulm: Extubated 3/15 - CT negative for PE Cards: EF 45-50% Cards consulted 3/18 for new onset afib in setting of aspiration PNA and hypokalemia.  PO metoprolol added - now on hold 2nd NPO Hepatobil: AST mildly elevated - thrombocytopenia (plts trending down) -most likely sepsis related.  3/18 HIT ordered and LMWH stopped, SCDs for VTE px.  triglycerides are elevated at 262  ID: Zosyn changed to rocephin on 3/18 for CAP, 3/14  sputum w/ E coli. WBC WNL. 3/18 CXR: slight improvement.  AF Best Practices: lovenox, MC TPN Access: CVC L IJ TPN day#: 5 (3/15>> )  Nutritional Goals: (per RD 3/15) 1900-2200 kCal/day 90-110 grams of protein/day  Plan:  - increase Clinimix E5/15 to 21mL/hr - to provide 72 gm protein and 1502 Kcals.  Goal rate is 83 ml/hr to provide 99.6 gm protein and 1894 kcals. - Mag 1 gm x 1 then 3 runs of K for repletion - Continue Lipids 20% at 56ml/hr - Continue SSI at least until TPN is at goal rate to assess glucose control with full nutrition - F/u AM labs  -consider adding back IV metoprolol  Eudelia Bunch, Pharm.D. [phone] 02/23/2015 7:48 AM

## 2015-02-23 NOTE — Care Management Note (Signed)
    Page 1 of 2   02/26/2015     2:25:18 PM CARE MANAGEMENT NOTE 02/26/2015  Patient:  Benjamin Blake, Benjamin Blake   Account Number:  [id number]  Date Initiated:  02/18/2015  Documentation initiated by:  [facility]  Subjective/Objective Assessment:   Admitted with renal failure - ?? SBO/ieus.  now intubated - ?? aspiration.     Action/Plan:   pt eval- hhpt- if patient goes with hh services they would like hhpt, but patient is not doing well so may need other services.   Anticipated DC Date:  02/27/2015   Anticipated DC Plan:  [place]  CM consult      [facility] Choice  HOME HEALTH   Choice offered to / List presented to:  C-3 Spouse        HH arranged  HH-1 RN  [place].   Status of service:  Completed, signed off Medicare Important Message given?  NO (If response is "NO", the following Medicare IM given date fields will be blank) Date Medicare IM given:   Medicare IM given by:   Date Additional Medicare IM given:   Additional Medicare IM given by:    Discharge Disposition:  [place]  Per UR Regulation:  Reviewed for med. necessity/level of care/duration of stay  If discussed at [place] of Stay Meetings, dates discussed:    Comments:  ContactEmari, [place] [phone]  [phone]  02/26/15 [place], BSN [phone] patient' wife wanted [facility] for hh serives, NCM made referral to [facility], [place] notified.  Soc will begin 24-48 hrs post dc.  02/23/15 [place] RN,BSN [phone] patient is not doing well and may require other services, but if he goes home with hh services they would like [facility].   02-18-15 10:30am [place] [phone] Tx to ICU and intubated this am.

## 2015-02-23 NOTE — Progress Notes (Signed)
*  PRELIMINARY RESULTS* Vascular Ultrasound Lower extremity venous duplex has been completed.  Preliminary findings: No evidence of DVT.  Landry Mellow, RDMS, RVT  02/23/2015, 11:55 AM

## 2015-02-23 NOTE — Progress Notes (Signed)
RT note:  Pt asked not to be awakened for cpt.  Pt is doing flutter on his own and in between respiratory visits.  Rt will continue to monitor.

## 2015-02-23 NOTE — Progress Notes (Signed)
Went to patient's room to assess and perform CPT.  Patient was sleeping and RN stated that he had just gotten to sleep and recommended not to wake him up.  Patient's breath sounds were clear and diminished, no rhonchi was heard. Flutter was in patient's room by bedside.

## 2015-02-23 NOTE — Progress Notes (Signed)
Asked by RN to assist with placing feeding tube.  Unable to pass feeding tube, meet resistance each attempt, tried both nostrils.  RN made aware, patient tolerated well.

## 2015-02-23 NOTE — Progress Notes (Signed)
Patient ID: Benjamin Blake, male   DOB: 1951-03-22, 64 y.o.   MRN: [id number]    Subjective:    No palpitations  Objective:   Temp:  [98.1 F (36.7 C)-98.4 F (36.9 C)] 98.1 F (36.7 C) (03/19 0538) Pulse Rate:  [101-125] 104 (03/19 0538) Resp:  [18-20] 18 (03/19 0538) BP: (103-115)/(65-74) 103/65 mmHg (03/19 0538) SpO2:  [94 %-97 %] 97 % (03/19 0538) Last BM Date: 02/22/15  Filed Weights   02/16/15 0815 02/16/15 1607 02/18/15 0126  Weight: 160 lb (72.576 kg) 155 lb 13.8 oz (70.7 kg) 164 lb 0.4 oz (74.4 kg)    Intake/Output Summary (Last 24 hours) at 02/23/15 1033 Last data filed at 02/23/15 0926  Gross per 24 hour  Intake    140 ml  Output   1950 ml  Net  -1810 ml    Telemetry: sinus tach low 100s  Exam:  General: NAD  Resp: coarse bilaterally  Cardiac: RRR, no m/r/g, no JVD  KN:LZJQBHA soft, NT, ND  MSK:no LE edema  Neuro: no focal deficits   Lab Results:  Basic Metabolic Panel:  Recent Labs Lab 02/21/15 0550 02/22/15 0630 02/23/15 0505  NA 137 141 140  K 2.7* 2.9* 3.5  CL 102 108 110  CO2 25 29 24   GLUCOSE 124* 111* 116*  BUN 13 17 19   CREATININE 0.85 0.89 0.83  CALCIUM 7.2* 7.6* 7.7*  MG 1.9 2.0 1.9    Liver Function Tests:  Recent Labs Lab 02/16/15 1040 02/20/15 0300 02/21/15 0550  AST 31 71* 69*  ALT 22 35 29  ALKPHOS 70 56 62  BILITOT 1.4* 1.1 0.9  PROT 6.8 4.2* 4.4*  ALBUMIN 3.2* 1.5* 1.4*    CBC:  Recent Labs Lab 02/21/15 0550 02/22/15 0630 02/23/15 0505  WBC 6.9 8.8 8.3  HGB 11.0* 10.6* 10.5*  HCT 31.9* 31.1* 31.7*  MCV 82.4 85.0 84.5  PLT 90* 83* 100*    Cardiac Enzymes:  Recent Labs Lab 02/19/15 1030 02/20/15 0300  TROPONINI 0.04* 0.04*    BNP: No results for input(s): PROBNP in the last 8760 hours.  Coagulation: No results for input(s): INR in the last 168 hours.  ECG:   Medications:   Scheduled Medications: . antiseptic oral rinse  7 mL Mouth Rinse q12n4p  . cefTRIAXone (ROCEPHIN)  IV  1 g  Intravenous Q24H  . chlorhexidine  15 mL Mouth Rinse BID  . guaiFENesin  1,200 mg Oral BID  . insulin aspart  0-9 Units Subcutaneous Q6H  . metoprolol tartrate  25 mg Oral Q6H  . potassium chloride  10 mEq Intravenous Q1 Hr x 3  . scopolamine  1 patch Transdermal Q72H  . sodium chloride  3 mL Intravenous Q12H     Infusions: . Marland KitchenTPN (CLINIMIX-E) Adult 40 mL/hr at 02/22/15 1815   And  . fat emulsion 240 mL (02/22/15 1815)  . Marland KitchenTPN (CLINIMIX-E) Adult     And  . fat emulsion       PRN Medications:  fentaNYL, heparin lock flush, ondansetron (ZOFRAN) IV, RESOURCE THICKENUP CLEAR, sodium chloride     Assessment/Plan     1. Afib - transient episode in setting of respiratory infection. Seen by Dr Tamala Julian yesterday and started on lopressor, from notes no plans for anticoagulation due to isolated transient episode in setting of infection and low potassium, hx of metastatic cancer, and CHADS2Vasc <2. ASA recommended only if ok with primary team.  - echo low normal to mildly decreased LVEF, TSH 1.07.  Please keep K at 4 and Mg at 2.  - continue lopressor, has been in sinus tach low 100s that is physiologic due to ongoing illness, would not titrate further at this time   Will signoff from inpatient cardiology standpoint, call with questions.      Carlyle Dolly, M.D.

## 2015-02-24 ENCOUNTER — Inpatient Hospital Stay (HOSPITAL_COMMUNITY): Payer: BLUE CROSS/BLUE SHIELD

## 2015-02-24 LAB — CBC
HCT: 30.5 % — ABNORMAL LOW (ref 39.0–52.0)
Hemoglobin: 10.2 g/dL — ABNORMAL LOW (ref 13.0–17.0)
MCH: 28.4 pg (ref 26.0–34.0)
MCHC: 33.4 g/dL (ref 30.0–36.0)
MCV: 85 fL (ref 78.0–100.0)
Platelets: 123 10*3/uL — ABNORMAL LOW (ref 150–400)
RBC: 3.59 MIL/uL — ABNORMAL LOW (ref 4.22–5.81)
RDW: 19.3 % — ABNORMAL HIGH (ref 11.5–15.5)
WBC: 8.2 10*3/uL (ref 4.0–10.5)

## 2015-02-24 LAB — BASIC METABOLIC PANEL
ANION GAP: 7 (ref 5–15)
BUN: 22 mg/dL (ref 6–23)
CHLORIDE: 111 mmol/L (ref 96–112)
CO2: 22 mmol/L (ref 19–32)
Calcium: 7.6 mg/dL — ABNORMAL LOW (ref 8.4–10.5)
Creatinine, Ser: 0.78 mg/dL (ref 0.50–1.35)
GFR calc Af Amer: >90 mL/min (ref >90)
GFR calc non Af Amer: >90 mL/min (ref >90)
Glucose, Bld: 127 mg/dL — ABNORMAL HIGH (ref 70–99)
POTASSIUM: 3.5 mmol/L (ref 3.5–5.1)
Sodium: 140 mmol/L (ref 135–145)

## 2015-02-24 LAB — CULTURE, BLOOD (ROUTINE X 2)
Culture: NO GROWTH
Culture: NO GROWTH

## 2015-02-24 LAB — GLUCOSE, CAPILLARY
GLUCOSE-CAPILLARY: 117 mg/dL — AB (ref 70–99)
GLUCOSE-CAPILLARY: 121 mg/dL — AB (ref 70–99)
GLUCOSE-CAPILLARY: 134 mg/dL — AB (ref 70–99)
Glucose-Capillary: 107 mg/dL — ABNORMAL HIGH (ref 70–99)
Glucose-Capillary: 108 mg/dL — ABNORMAL HIGH (ref 70–99)
Glucose-Capillary: 111 mg/dL — ABNORMAL HIGH (ref 70–99)

## 2015-02-24 LAB — MAGNESIUM: Magnesium: 2.1 mg/dL (ref 1.5–2.5)

## 2015-02-24 MED ORDER — POTASSIUM CHLORIDE 10 MEQ/50ML IV SOLN
10.0000 meq | INTRAVENOUS | Status: AC
  Administered 2015-02-24 (×4): 10 meq via INTRAVENOUS
  Filled 2015-02-24 (×4): qty 50

## 2015-02-24 MED ORDER — TRACE MINERALS CR-CU-F-FE-I-MN-MO-SE-ZN IV SOLN
INTRAVENOUS | Status: AC
  Administered 2015-02-24: 18:00:00 via INTRAVENOUS
  Filled 2015-02-24: qty 1992

## 2015-02-24 MED ORDER — FAT EMULSION 20 % IV EMUL
240.0000 mL | INTRAVENOUS | Status: AC
  Administered 2015-02-24: 240 mL via INTRAVENOUS
  Filled 2015-02-24: qty 250

## 2015-02-24 MED ORDER — GUAIFENESIN 100 MG/5ML PO SYRP
200.0000 mg | ORAL_SOLUTION | Freq: Four times a day (QID) | ORAL | Status: DC
Start: 2015-02-24 — End: 2015-02-27
  Administered 2015-02-24 – 2015-02-27 (×10): 200 mg via ORAL
  Filled 2015-02-24 (×21): qty 10

## 2015-02-24 MED ORDER — ALPRAZOLAM 0.25 MG PO TABS
0.2500 mg | ORAL_TABLET | Freq: Every evening | ORAL | Status: DC | PRN
  Administered 2015-02-24: 0.25 mg
  Filled 2015-02-24: qty 1

## 2015-02-24 MED ORDER — JEVITY 1.2 CAL PO LIQD
1000.0000 mL | ORAL | Status: DC
  Administered 2015-02-24: 1000 mL
  Filled 2015-02-24 (×3): qty 1000

## 2015-02-24 MED ORDER — ALPRAZOLAM 0.25 MG PO TABS: 0.2500 mg | ORAL_TABLET | Freq: Every evening | ORAL | Status: DC | PRN

## 2015-02-24 NOTE — Progress Notes (Signed)
Patient Demographics  Benjamin Blake, is a 64 y.o. male, DOB - [birth year], ZSW:[id number]  Admit date - 02/16/2015   Admitting Physician Allie Bossier, MD  Outpatient Primary MD for the patient is Default, Provider, MD  LOS - 7   Chief Complaint  Patient presents with  . Dehydration      Admission history of present illness/brief narrative: Benjamin Blake is a 64 y.o. male, who just moved to the area from [place].He has metastatic colon cancer and presents to our ER for intractable vomiting and diarrhea. His colon cancer was originally diagnosed in 2010. He underwent surgery at that time. Unfortunately they found a recurrence in 2015 with metastases to the liver, peritoneum, and lung. He is undergoing chemotherapy at [facility]. His oncologist is Dr. Reynaldo Minium. His last chemotherapy treatment was 01/31/15. He was feeling fairly well until Thursday 3/10. Developed respiratory distress 3/14 due to apparent aspiration. Transferred to ICU, PCCM consult, intubated on 3/14, patient was extubated on 3/15 , was on TPN, NGT was discontinued on 3/17, at the swallow evaluation, advanced to dysphagia 2 with thick liquid. But noticed to continue have cough after that, had FEES study done, where he was kept nothing by mouth.  Subjective:   Benjamin Blake today has, No headache, No chest pain, No abdominal pain - No Nausea, still having diarrhea, cough much improved.  Assessment & Plan    Active Problems:   AKI (acute kidney injury)   Hypokalemia   Hyponatremia   Nausea with vomiting   Colon cancer, metastatic   Acute respiratory failure   Aspiration into airway   Abdominal pain   A-fib  Acute hypoxic respiratory failure - Secondary to aspiration pneumonia, post extubation 3/14, extubated on 3/15 - Currently on nasal cannula, wean as tolerated. - Pulmonary toilet.  Aspiration  pneumonia - On IV Zosyn 3/12, transitioned to Rocephin 3/18, will stop today. - Sputum culture growing Escherichia coli.   Transient Atrial fibrillation/ sinus tachycardia - New onset, Chadsvasc 2 score <2 , will start aspirin when more stable. - cardiology consult appreciated - IV metoprolol changed to oral metoprolol.   diarrhea - C. difficile negative 2, most likely related to chemotherapy. - DC IV Reglan, C. difficile negative, once patient tolerated oral intake, will start on Imodium.  Protein calorie malnutrition - Tube  feed was initiated yesterday, if tolerates Tube feed will discontinue TPN.  Hypokalemia - Replaced per pharmacy part of TPN management.  Thrombocytopenia - Most likely related to sepsis. Improving - HIT antibody WNL - Continue to monitor closely, - No evidence of DVT on venous Doppler.  Metastatic colon cancer - patientreceived chemotherapy [facility], further chemotherapy decisions by [facility] after discharge.   Code Status: Full  Family Communication: family at bedside  Disposition Plan: pending further workup   Procedures  Intubated /14, extubated 3/15   Consults   PCCM   Medications  Scheduled Meds: . antiseptic oral rinse  7 mL Mouth Rinse q12n4p  . aspirin  81 mg Per Tube Daily  . cefTRIAXone (ROCEPHIN)  IV  1 g Intravenous Q24H  . chlorhexidine  15 mL Mouth Rinse BID  . enoxaparin (LOVENOX) injection  40 mg Subcutaneous Q24H  . feeding supplement (JEVITY 1.2 CAL)  1,000 mL Per Tube Q24H  . guaifenesin  200 mg Oral 4 times per day  . insulin aspart  0-9 Units Subcutaneous Q6H  . metoprolol tartrate  25 mg Per Tube Q6H  . scopolamine  1 patch Transdermal Q72H  . sodium chloride  3 mL Intravenous Q12H   Continuous Infusions: . Marland KitchenTPN (CLINIMIX-E) Adult 60 mL/hr at 02/23/15 1731   And  . fat emulsion 240 mL (02/23/15 1731)  . Marland KitchenTPN (CLINIMIX-E) Adult     And  . fat emulsion     PRN Meds:.fentaNYL, heparin lock flush, ondansetron  (ZOFRAN) IV, RESOURCE THICKENUP CLEAR, sodium chloride  DVT Prophylaxis  - SCDs ( Lovenox on hold pending venous Doppler)  Lab Results  Component Value Date   PLT 123* 02/24/2015    Antibiotics    Anti-infectives    Start     Dose/Rate Route Frequency Ordered Stop   02/22/15 1400  cefTRIAXone (ROCEPHIN) 1 g in dextrose 5 % 50 mL IVPB - Premix     1 g 100 mL/hr over 30 Minutes Intravenous Every 24 hours 02/22/15 1313     02/19/15 1400  piperacillin-tazobactam (ZOSYN) IVPB 3.375 g  Status:  Discontinued     3.375 g 12.5 mL/hr over 240 Minutes Intravenous 3 times per day 02/19/15 1009 02/22/15 1232   02/19/15 1200  fluconazole (DIFLUCAN) IVPB 400 mg  Status:  Discontinued     400 mg 100 mL/hr over 120 Minutes Intravenous Every 24 hours 02/18/15 1227 02/19/15 1002   02/18/15 1400  piperacillin-tazobactam (ZOSYN) IVPB 3.375 g  Status:  Discontinued     3.375 g 12.5 mL/hr over 240 Minutes Intravenous Every 8 hours 02/18/15 0731 02/18/15 1227   02/18/15 1400  imipenem-cilastatin (PRIMAXIN) 500 mg in sodium chloride 0.9 % 100 mL IVPB  Status:  Discontinued     500 mg 200 mL/hr over 30 Minutes Intravenous 4 times per day 02/18/15 1237 02/19/15 1002   02/18/15 1300  fluconazole (DIFLUCAN) IVPB 800 mg     800 mg 200 mL/hr over 120 Minutes Intravenous  Once 02/18/15 1227 02/18/15 1618   02/18/15 0800  vancomycin (VANCOCIN) IVPB 750 mg/150 ml premix  Status:  Discontinued     750 mg 150 mL/hr over 60 Minutes Intravenous Every 12 hours 02/18/15 0731 02/19/15 1002   02/18/15 0745  piperacillin-tazobactam (ZOSYN) IVPB 3.375 g     3.375 g 100 mL/hr over 30 Minutes Intravenous  Once 02/18/15 0731 02/18/15 0826          Objective:   Filed Vitals:   02/23/15 1406 02/23/15 2111 02/24/15 0006 02/24/15 0619  BP: 115/68 112/70  101/60  Pulse: 114 109 102 99  Temp: 98.9 F (37.2 C) 99 F (37.2 C)  98.6 F (37 C)  TempSrc:  Oral  Oral  Resp: 18 20 20 22   Height:      Weight:      SpO2:  91% 97% 94% 93%    Wt Readings from Last 3 Encounters:  02/18/15 74.4 kg (164 lb 0.4 oz)     Intake/Output Summary (Last 24 hours) at 02/24/15 1257 Last data filed at 02/24/15 0857  Gross per 24 hour  Intake 665.33 ml  Output   2850 ml  Net -2184.67 ml     Physical Exam  Awake Alert,  frail, ill-appearing ,Oriented X 3, No new F.N deficits, Normal affect [place].AT,PERRAL Supple Neck,No JVD, No cervical lymphadenopathy appriciated.  Symmetrical Chest wall movement, Good air movement bilaterally,  Irregular irregular ,  No Gallops,Rubs or new Murmurs, No Parasternal Heave +ve B.Sounds, Abd Soft, No tenderness, No organomegaly appriciated, No rebound - guarding or rigidity. No Cyanosis, Clubbing or edema, No new Rash or bruise     Data Review   Micro Results Recent Results (from the past 240 hour(s))  Clostridium Difficile by PCR     Status: None   Collection Time: 02/16/15 10:01 PM  Result Value Ref Range Status   C difficile by pcr NEGATIVE NEGATIVE Final  MRSA PCR Screening     Status: None   Collection Time: 02/18/15  1:30 AM  Result Value Ref Range Status   MRSA by PCR NEGATIVE NEGATIVE Final    Comment:        The GeneXpert MRSA Assay (FDA approved for NASAL specimens only), is one component of a comprehensive MRSA colonization surveillance program. It is not intended to diagnose MRSA infection nor to guide or monitor treatment for MRSA infections.   Urine culture     Status: None   Collection Time: 02/18/15  1:13 PM  Result Value Ref Range Status   Specimen Description URINE, RANDOM  Final   Special Requests Immunocompromised  Final   Colony Count NO GROWTH Performed at [facility]   Final   Culture NO GROWTH Performed at [facility]   Final   Report Status 02/19/2015 FINAL  Final  Culture, respiratory (NON-Expectorated)     Status: None   Collection Time: 02/18/15  1:20 PM  Result Value Ref Range Status   Specimen Description  ENDOTRACHEAL  Final   Special Requests NONE  Final   Gram Stain   Final    ABUNDANT WBC PRESENT,BOTH PMN AND MONONUCLEAR RARE SQUAMOUS EPITHELIAL CELLS PRESENT FEW GRAM POSITIVE COCCI IN PAIRS RARE YEAST Performed at [facility]    Culture   Final    MODERATE ESCHERICHIA COLI MODERATE CANDIDA ALBICANS Performed at [facility]    Report Status 02/21/2015 FINAL  Final   Organism ID, Bacteria ESCHERICHIA COLI  Final      Susceptibility   Escherichia coli - MIC*    AMPICILLIN >=32 RESISTANT Resistant     AMPICILLIN/SULBACTAM 8 SENSITIVE Sensitive     CEFAZOLIN <=4 SENSITIVE Sensitive     CEFEPIME <=1 SENSITIVE Sensitive     CEFTAZIDIME <=1 SENSITIVE Sensitive     CEFTRIAXONE <=1 SENSITIVE Sensitive     CIPROFLOXACIN <=0.25 SENSITIVE Sensitive     GENTAMICIN <=1 SENSITIVE Sensitive     IMIPENEM <=0.25 SENSITIVE Sensitive     PIP/TAZO <=4 SENSITIVE Sensitive     TOBRAMYCIN <=1 SENSITIVE Sensitive     TRIMETH/SULFA <=20 SENSITIVE Sensitive     * MODERATE ESCHERICHIA COLI  Culture, blood (x 2)     Status: None   Collection Time: 02/18/15  2:00 PM  Result Value Ref Range Status   Specimen Description BLOOD LEFT ARM  Final   Special Requests BOTTLES DRAWN AEROBIC AND ANAEROBIC 3CC  Final   Culture   Final    NO GROWTH 5 DAYS Performed at [facility]    Report Status 02/24/2015 FINAL  Final  Culture, blood (x 2)     Status: None   Collection Time: 02/18/15  2:07 PM  Result Value Ref Range Status   Specimen Description BLOOD LEFT HAND  Final   Special Requests BOTTLES DRAWN AEROBIC AND ANAEROBIC 4CC  Final   Culture   Final    NO GROWTH 5 DAYS Performed at [facility]    Report Status 02/24/2015 FINAL  Final  Clostridium Difficile by PCR     Status: None   Collection Time: 02/19/15 10:11 PM  Result Value Ref Range Status   C difficile by pcr NEGATIVE NEGATIVE Final    Radiology Reports Dg Abd Portable 1v  02/24/2015   CLINICAL DATA:   NG tube placement  EXAM: PORTABLE ABDOMEN - 1 VIEW  COMPARISON:  02/23/2015  FINDINGS: Enteric tube terminates in the proximal duodenum.  Nonobstructive bowel gas pattern.  Visualized osseous structures are within normal limits.  IMPRESSION: Enteric tube terminates in the proximal duodenum.   Electronically Signed   By: Julian Hy M.D.   On: 02/24/2015 11:10   Dg Abd Portable 1v  02/23/2015   CLINICAL DATA:  Nasogastric tube placement.  EXAM: PORTABLE ABDOMEN - 1 VIEW  COMPARISON:  02/20/2015  FINDINGS: Nasogastric tube passes well below the diaphragm to have its tip in the distal stomach or possibly first portion of the duodenum.  There are few mildly prominent loops of small bowel most evident in the left quadrant with less dilation than noted on the CT from 02/18/2015. Bowel anastomosis staples are noted in the right quadrant, stable. Soft tissues are otherwise unremarkable. No gross free air on this supine study.  IMPRESSION: Nasogastric tube well positioned.   Electronically Signed   By: Lajean Manes M.D.   On: 02/23/2015 14:25    CBC  Recent Labs Lab 02/20/15 0300 02/21/15 0550 02/22/15 0630 02/23/15 0505 02/24/15 0425  WBC 5.0 6.9 8.8 8.3 8.2  HGB 11.5* 11.0* 10.6* 10.5* 10.2*  HCT 32.8* 31.9* 31.1* 31.7* 30.5*  PLT 106* 90* 83* 100* 123*  MCV 82.6 82.4 85.0 84.5 85.0  MCH 29.0 28.4 29.0 28.0 28.4  MCHC 35.1 34.5 34.1 33.1 33.4  RDW 17.9* 18.1* 18.8* 19.2* 19.3*  LYMPHSABS 0.4*  --   --   --   --   MONOABS 0.8  --   --   --   --   EOSABS 0.2  --   --   --   --   BASOSABS 0.0  --   --   --   --     Chemistries   Recent Labs Lab 02/20/15 0300 02/21/15 0550 02/22/15 0630 02/23/15 0505 02/24/15 0425  NA 134* 137 141 140 140  K 3.3* 2.7* 2.9* 3.5 3.5  CL 103 102 108 110 111  CO2 27 25 29 24 22   GLUCOSE 129* 124* 111* 116* 127*  BUN 13 13 17 19 22   CREATININE 0.95 0.85 0.89 0.83 0.78  CALCIUM 7.1* 7.2* 7.6* 7.7* 7.6*  MG 1.8 1.9 2.0 1.9 2.1  AST 71* 69*  --    --   --   ALT 35 29  --   --   --   ALKPHOS 56 62  --   --   --   BILITOT 1.1 0.9  --   --   --    ------------------------------------------------------------------------------------------------------------------ estimated creatinine clearance is 91.5 mL/min (by C-G formula based on Cr of 0.78). ------------------------------------------------------------------------------------------------------------------ No results for input(s): HGBA1C in the last 72 hours. ------------------------------------------------------------------------------------------------------------------ No results for input(s): CHOL, HDL, LDLCALC, TRIG, CHOLHDL, LDLDIRECT in the last 72 hours. ------------------------------------------------------------------------------------------------------------------ No results for input(s): TSH, T4TOTAL, T3FREE, THYROIDAB in the last 72 hours.  Invalid input(s): FREET3 ------------------------------------------------------------------------------------------------------------------ No results for input(s): VITAMINB12, FOLATE, FERRITIN, TIBC, IRON, RETICCTPCT in the last 72 hours.  Coagulation profile No results for input(s): INR, PROTIME in  the last 168 hours.  No results for input(s): DDIMER in the last 72 hours.  Cardiac Enzymes  Recent Labs Lab 02/19/15 1030 02/20/15 0300  TROPONINI 0.04* 0.04*   ------------------------------------------------------------------------------------------------------------------ Invalid input(s): POCBNP     Time Spent in minutes   35 minutes   Jennifier Smitherman M.D on 02/24/2015 at 12:57 PM  Between 7am to 7pm - Pager - [phone]  After 7pm go to [email or web address] - password [email or web address]  And look for the night coverage person covering for me after hours  [facility] Office  [phone]   **Disclaimer: This note may have been dictated with voice recognition software. Similar sounding words can inadvertently be  transcribed and this note may contain transcription errors which may not have been corrected upon publication of note.**

## 2015-02-24 NOTE — Progress Notes (Signed)
PARENTERAL NUTRITION CONSULT NOTE - Follow-up  Pharmacy Consult for TPN Indication: intolerance to enteral nutrition, ileus vs SBO  No Known Allergies  Patient Measurements: Height: 5\' 5"  (165.1 cm) Weight: 164 lb 0.4 oz (74.4 kg) IBW/kg (Calculated) : 61.5  Vital Signs: Temp: 98.6 F (37 C) (03/20 0619) Temp Source: Oral (03/20 0619) BP: 101/60 mmHg (03/20 0619) Pulse Rate: 99 (03/20 0619) Intake/Output from previous day: 03/19 0701 - 03/20 0700 In: 885.3 [P.O.:30; I.V.:20; NG/GT:35.3; IV Piggyback:300] Out: 2850 [Urine:1150; Stool:1700] Intake/Output from this shift:    Labs:  Recent Labs  02/22/15 0630 02/23/15 0505 02/24/15 0425  WBC 8.8 8.3 8.2  HGB 10.6* 10.5* 10.2*  HCT 31.1* 31.7* 30.5*  PLT 83* 100* 123*     Recent Labs  02/22/15 0630 02/23/15 0505 02/24/15 0425  NA 141 140 140  K 2.9* 3.5 3.5  CL 108 110 111  CO2 29 24 22   GLUCOSE 111* 116* 127*  BUN 17 19 22   CREATININE 0.89 0.83 0.78  CALCIUM 7.6* 7.7* 7.6*  MG 2.0 1.9 2.1  PHOS 2.5  --   --    Estimated Creatinine Clearance: 91.5 mL/min (by C-G formula based on Cr of 0.78).    Recent Labs  02/23/15 1815 02/23/15 2333 02/24/15 0617  GLUCAP 148* 134* 117*   Insulin Requirements in the past 24 hours:  1 units SSI for cbg 134  Current Nutrition:  Clinimix E5/15 at 91ml/hr + lipids 20% at 56ml/hr - provides 72 gm protein/day  and 1502 Kcal/day  Diet changed from dys 2 to NPO 3/18 2nd failed FEES study Jevity 1.2 started 3/19 at 1714, then NG came out 3/20 at 0005 am  Assessment: 63 yom with metastatic colon cancer undergoing chemotherapy, admitted 3/12 with intractable vomiting and diarrhea. CT abdomen shows possible ileus vs early obstruction. Per patients family, he had not eaten x 6 days prior to admission due to N/V.    GI: Colon cancer with liver + lung mets ([facility] PTA) - SBO vs ileus - s/p R hemicolectomy in 12/15. Pt admitted w/ intractable N/V.  Pt at risk for refeeding d/t  no PO intake x 7 days prior to TPN initiation. NG clamped and clear liquids started 3/16, diet advanced to dys 2 3/17. NGT out 3/17.  Failed FEES study 3/18 and made NPO again.  NG replaced 3/19 by MD 3/19 (RN unable to pas feeding tube) then NGT came out 3/20 am accidentally. Got Jevity 1.2 TF for about 7 hours yesterday. IV reglan stopped 3/18.  Stool output 2700 > 1300 mls >1700 mls.     Endo: TSH WNL, CBGs good on SSI; only 1 unit insulin needed- continue SSI until TPN is at goal and then will DC CBGs/SSI Lytes: Na 140, K 3.5 after 3 runs yest (goal 4),  Mag  2.1 after 1 gm yesterday (goal 2)  Likely some refeeding as pt has not been eating for a week prior to TPN initiation.  Renal: Scr 0.78, UOP 0.6 ml/kg/hr Pulm: Extubated 3/15 - CT negative for PE Cards: EF 45-50% Cards consulted 3/18 for new onset afib in setting of aspiration PNA and hypokalemia.  Metoprolol per tube added - now on hold 2nd NPO and tube out Hepatobil: AST mildly elevated - thrombocytopenia (plts trending down) -most likely sepsis related.  3/18 HIT ordered and LMWH stopped, SCDs for VTE px.  triglycerides are elevated at 262  ID: Zosyn changed to rocephin on 3/18 for CAP, 3/14 sputum w/ E coli.  WBC WNL. 3/18 CXR: slight improvement.  AF Best Practices: lovenox, MC TPN Access: CVC L IJ TPN day#: 6 (3/15>> )  Nutritional Goals: (per RD 3/15) 1900-2200 kCal/day 90-110 grams of protein/day  Plan:  - increase Clinimix E5/15 to goal rate of 88mL/hr - to provide 99.6 gm protein (100% goal)  and 1894 kcals (~ 100 % goal) - f/u for re-insertion of NG tube and resumption of tube feeding - 4 runs of K for repletion - Continue Lipids 20% at 92ml/hr - Continue SSI at least until TPN is at goal rate to assess glucose control with full nutrition, will DC if not needed - F/u AM labs   Eudelia Bunch, Pharm.D. [phone] 02/24/2015 7:19 AM

## 2015-02-24 NOTE — Progress Notes (Addendum)
As patient was being cleaned up, his NG tube became dislodged at approximately 0005. The line was did not appear to be caught on anything, suspect the tape came loose as the tube seemed to slip out effortlessly. Paged on-call  Clinician  for orders. Pt stated that during day shift the nurse and rapid response nurse had difficulty with insertion of the NG with multiple attempts. Pt stated that NG tube was inserted by a physician. Night on-call clinician advised to leave out the NG tube the rest of this shift.

## 2015-02-25 LAB — COMPREHENSIVE METABOLIC PANEL
ALT: 65 U/L — ABNORMAL HIGH (ref 0–53)
ANION GAP: 7 (ref 5–15)
AST: 89 U/L — ABNORMAL HIGH (ref 0–37)
Albumin: 1.4 g/dL — ABNORMAL LOW (ref 3.5–5.2)
Alkaline Phosphatase: 94 U/L (ref 39–117)
BILIRUBIN TOTAL: 1.1 mg/dL (ref 0.3–1.2)
BUN: 19 mg/dL (ref 6–23)
CO2: 22 mmol/L (ref 19–32)
CREATININE: 0.79 mg/dL (ref 0.50–1.35)
Calcium: 7.8 mg/dL — ABNORMAL LOW (ref 8.4–10.5)
Chloride: 110 mmol/L (ref 96–112)
GFR calc non Af Amer: >90 mL/min (ref >90)
GLUCOSE: 138 mg/dL — AB (ref 70–99)
POTASSIUM: 3.5 mmol/L (ref 3.5–5.1)
Sodium: 139 mmol/L (ref 135–145)
TOTAL PROTEIN: 4.9 g/dL — AB (ref 6.0–8.3)

## 2015-02-25 LAB — CBC
HEMATOCRIT: 29.9 % — AB (ref 39.0–52.0)
HEMOGLOBIN: 10.1 g/dL — AB (ref 13.0–17.0)
MCH: 28.9 pg (ref 26.0–34.0)
MCHC: 33.8 g/dL (ref 30.0–36.0)
MCV: 85.4 fL (ref 78.0–100.0)
Platelets: 179 10*3/uL (ref 150–400)
RBC: 3.5 MIL/uL — ABNORMAL LOW (ref 4.22–5.81)
RDW: 19.5 % — ABNORMAL HIGH (ref 11.5–15.5)
WBC: 9.1 10*3/uL (ref 4.0–10.5)

## 2015-02-25 LAB — GLUCOSE, CAPILLARY
GLUCOSE-CAPILLARY: 141 mg/dL — AB (ref 70–99)
GLUCOSE-CAPILLARY: 82 mg/dL (ref 70–99)
Glucose-Capillary: 101 mg/dL — ABNORMAL HIGH (ref 70–99)
Glucose-Capillary: 122 mg/dL — ABNORMAL HIGH (ref 70–99)

## 2015-02-25 LAB — DIFFERENTIAL
BASOS ABS: 0 10*3/uL (ref 0.0–0.1)
Basophils Relative: 0 % (ref 0–1)
EOS ABS: 0.3 10*3/uL (ref 0.0–0.7)
Eosinophils Relative: 3 % (ref 0–5)
LYMPHS PCT: 14 % (ref 12–46)
Lymphs Abs: 1.3 10*3/uL (ref 0.7–4.0)
Monocytes Absolute: 0.6 10*3/uL (ref 0.1–1.0)
Monocytes Relative: 7 % (ref 3–12)
Neutro Abs: 6.9 10*3/uL (ref 1.7–7.7)
Neutrophils Relative %: 76 % (ref 43–77)

## 2015-02-25 LAB — PHOSPHORUS: Phosphorus: 3.2 mg/dL (ref 2.3–4.6)

## 2015-02-25 LAB — TRIGLYCERIDES: TRIGLYCERIDES: 149 mg/dL (ref <150)

## 2015-02-25 LAB — MAGNESIUM: Magnesium: 1.8 mg/dL (ref 1.5–2.5)

## 2015-02-25 MED ORDER — MAGNESIUM SULFATE 2 GM/50ML IV SOLN
2.0000 g | Freq: Once | INTRAVENOUS | Status: AC
  Administered 2015-02-25: 2 g via INTRAVENOUS
  Filled 2015-02-25: qty 50

## 2015-02-25 MED ORDER — ZOLPIDEM TARTRATE 5 MG PO TABS
5.0000 mg | ORAL_TABLET | Freq: Once | ORAL | Status: AC
  Administered 2015-02-25: 5 mg via ORAL
  Filled 2015-02-25: qty 1

## 2015-02-25 MED ORDER — JEVITY 1.2 CAL PO LIQD
1000.0000 mL | ORAL | Status: DC
  Administered 2015-02-25 – 2015-02-26 (×2): 1000 mL
  Filled 2015-02-25 (×4): qty 1000

## 2015-02-25 MED ORDER — LOPERAMIDE HCL 1 MG/5ML PO LIQD
4.0000 mg | Freq: Three times a day (TID) | ORAL | Status: DC | PRN
  Administered 2015-02-25 – 2015-02-26 (×2): 4 mg via ORAL
  Filled 2015-02-25 (×5): qty 20

## 2015-02-25 MED ORDER — ALPRAZOLAM 0.5 MG PO TABS
0.5000 mg | ORAL_TABLET | Freq: Every evening | ORAL | Status: DC | PRN
  Administered 2015-02-25: 0.5 mg
  Filled 2015-02-25: qty 1

## 2015-02-25 MED ORDER — POTASSIUM CHLORIDE 20 MEQ/15ML (10%) PO SOLN
20.0000 meq | Freq: Three times a day (TID) | ORAL | Status: AC
  Administered 2015-02-25 (×3): 20 meq via ORAL
  Filled 2015-02-25 (×3): qty 15

## 2015-02-25 NOTE — Progress Notes (Signed)
Physical Therapy Treatment Patient Details Name: Benjamin Blake MRN: [id number] DOB: 09-12-1951 Today's Date: 02/25/2015    History of Present Illness Pt is a 64 y.o. male, who just moved to the area from [place]. He has metastatic colon cancer and presents to our ER for intractable vomiting and diarrhea. His colon cancer was originally diagnosed in 2010. Unfortunately they found a recurrence in 2015 with metastases to the liver, peritoneum, and lung. He is undergoing chemotherapy at [facility]. His oncologist is Dr. Reynaldo Minium. His last chemotherapy treatment was 01/31/15. He was feeling fairly well until Thursday 3/10. After dinner on Thursday he began having vomiting and diarrhea. His wife called the oncologist who recommended pushing fluids including Pedialyte. They attempted to do this, but he became progressively weaker and kept vomiting. Consequently they came to the emergency department. Pt was admitted for further evaluation.3/14 pt with Afib with RVR and intubated, extubated 3/15    PT Comments    Pt is progressing well with his mobility and was able to walk today without O2.  I encouraged walking multiple times a day now that he is better able to tolerate it and gave an HEP exercise handout for him to do as well.  Both wife and patient are very motivated for him to get better and he would like to start practicing some walking without the RW in future visits (we talked about one lap with the walker and one lap without the walker).  PT will continue to follow acutely and he would continue to benefit from HHPT at discharge.   Follow Up Recommendations  Home health PT     Equipment Recommendations  Rolling walker with 5" wheels    Recommendations for Other Services   NA     Precautions / Restrictions Precautions Precautions: Fall Precaution Comments: pt is mildly unsteady on his feet.  No longer has flexiseal    Mobility  Bed Mobility Overal bed mobility: Needs Assistance Bed  Mobility: Supine to Sit     Supine to sit: Supervision     General bed mobility comments: supervision for safety, reliance on bed rail for support HOB >30 degrees due to tube feeds.  Transfers Overall transfer level: Needs assistance Equipment used: Rolling walker (2 wheeled) Transfers: Sit to/from Stand Sit to Stand: Min guard         General transfer comment: Min guard assist to support pt for balance during transitions.  Very mild posterior preference initially.   Ambulation/Gait Ambulation/Gait assistance: Min guard Ambulation Distance (Feet): 300 Feet Assistive device: Rolling walker (2 wheeled) Gait Pattern/deviations: Step-through pattern;Staggering left (when looking right) Gait velocity: decreased Gait velocity interpretation: Below normal speed for age/gender General Gait Details: Pt with slow, staggering gait pattern. Has difficulty with multi tasking during gait, walking and turning his head, making turns while keeping all four points of the walker on the ground.  Min guard assist to regain balance.  VSS during gait despite 2/4 DOE.           Balance Overall balance assessment: Needs assistance Sitting-balance support: Feet supported;No upper extremity supported Sitting balance-Leahy Scale: Good     Standing balance support: No upper extremity supported;Bilateral upper extremity supported;Single extremity supported Standing balance-Leahy Scale: Poor                      Cognition Arousal/Alertness: Awake/alert Behavior During Therapy: WFL for tasks assessed/performed Overall Cognitive Status: Within Functional Limits for tasks assessed  Exercises General Exercises - Lower Extremity Long Arc Quad: AROM;Both;10 reps;Seated (5 sec holds) Hip ABduction/ADduction: AROM;Both;10 reps;Seated;Other (comment) (adduction only against pillow for resistance 5 sec holds) Hip Flexion/Marching: AROM;Both;10 reps;Seated Toe Raises:  AROM;Both;10 reps;Seated Heel Raises: AROM;Both;10 reps;Seated        Pertinent Vitals/Pain Pain Assessment: No/denies pain           PT Goals (current goals can now be found in the care plan section) Acute Rehab PT Goals Patient Stated Goal: return to golf, walking and biking Progress towards PT goals: Progressing toward goals    Frequency  Min 3X/week    PT Plan Current plan remains appropriate       End of Session   Activity Tolerance: Patient limited by fatigue Patient left: in chair;with call bell/phone within reach;with family/visitor present     Time: 8811-0315 PT Time Calculation (min) (ACUTE ONLY): 33 min  Charges:  $Gait Training: 8-22 mins $Therapeutic Exercise: 8-22 mins                      Kaydan Wong B. [place], [place], DPT [phone]   02/25/2015, 3:27 PM

## 2015-02-25 NOTE — Progress Notes (Signed)
NUTRITION FOLLOW-UP  INTERVENTION: TPN per pharmacy  Increase Jevity 1.2 via NGT to goal rate of 75 ml/hr  Tube feeding regimen provides 2160 kcal (100% of estimated needs), 100 grams of protein, 1372 ml of free water  NUTRITION DIAGNOSIS: Inadequate oral intake related to inability to eat as evidenced by NPO status; ongoing  Goal: Pt to meet >/= 90% of estimated needs; met  Monitor:  TPN tolerance/adequacy, TF tolerance/adequacy, GI status, diet advancement, labs   ASSESSMENT: Pt with metastatic colon cancer, followed by [facility]. Presented with intractable vomiting and diarrhea, episodes of coffee ground emesis.   Intubated 3/14 Extubated 3/15  NG clamped and clear liquids 3/16, diet advanced to dysphagia 3/17. NGT out 3/17, failed FEES study 3/18 and made NPO again. NG was replaced 3/19 by MD. Stool output improving.  Currently receiving Clinimix E 5/15 at 83 ml/hr + IVFE 20% at 10 ml/hr (1894 kcal and 99.6 grams of protein) Jevity 1.2 at 40 ml/hr   3/20- Started on Jevity 1.2 and currently @ 40 ml/hr; consulted for enteral/tube feed initiation and management. Talked with RN about pt, and she reported that pt's BMs and GI status have improved. Scheduled to advance to goal rate this evening. SLP will follow for potential diet advancement.  Wife at bedside upon assessment. Pt reported bowel activity, and denied any N/V. He stated that the Imodium has been helping. Continue to monitor GI trends, TF tolerance/adequacy.   Plan per Pharmacy: - TF being advanced to goal rate and may d/c TPN post this bag per MD - Decrease TPN to 40 ml/hr then d/c at 1800 tonight - Consider discontinuing SSI/CBG check in AM if CBGs remain at goal TF rate - Mag sulfate 2gm IV x 1 and KCL 2mq PT TID x 3 doses, further supplementation per MD - D/C TPN labs  Labs and medications reviewed: Low Ca   Height: Ht Readings from Last 1 Encounters:  02/18/15 5' 5"  (1.651 m)    Weight: Wt Readings from  Last 1 Encounters:  02/18/15 164 lb 0.4 oz (74.4 kg)    BMI:  Body mass index is 27.29 kg/(m^2). Overweight  Estimated Nutritional Needs: Kcal: 1900-2200 kcal Protein: 90-110 g protein Fluid: >/= 2.0 L  Skin: Appropriate, dry, intact  Diet Order: Diet NPO time specified Except for: Ice Chips .TPN (CLINIMIX-E) Adult; NPO  EDUCATION NEEDS: -No education needs identified at this time   Intake/Output Summary (Last 24 hours) at 02/25/15 1232 Last data filed at 02/25/15 1018  Gross per 24 hour  Intake    267 ml  Output   2050 ml  Net  -1783 ml    Last BM: Rectal pouch 3/21  Labs:   Recent Labs Lab 02/21/15 0550 02/22/15 0630 02/23/15 0505 02/24/15 0425 02/25/15 0602  NA 137 141 140 140 139  K 2.7* 2.9* 3.5 3.5 3.5  CL 102 108 110 111 110  CO2 25 29 24 22 22   BUN 13 17 19 22 19   CREATININE 0.85 0.89 0.83 0.78 0.79  CALCIUM 7.2* 7.6* 7.7* 7.6* 7.8*  MG 1.9 2.0 1.9 2.1 1.8  PHOS 2.3 2.5  --   --  3.2  GLUCOSE 124* 111* 116* 127* 138*    CBG (last 3)   Recent Labs  02/25/15 0040 02/25/15 0631 02/25/15 1212  GLUCAP 101* 141* 82    Scheduled Meds: . antiseptic oral rinse  7 mL Mouth Rinse q12n4p  . aspirin  81 mg Per Tube Daily  . cefTRIAXone (ROCEPHIN)  IV  1 g Intravenous Q24H  . chlorhexidine  15 mL Mouth Rinse BID  . enoxaparin (LOVENOX) injection  40 mg Subcutaneous Q24H  . feeding supplement (JEVITY 1.2 CAL)  1,000 mL Per Tube Q24H  . feeding supplement (JEVITY 1.2 CAL)  1,000 mL Per Tube Q24H  . guaifenesin  200 mg Oral 4 times per day  . insulin aspart  0-9 Units Subcutaneous Q6H  . metoprolol tartrate  25 mg Per Tube Q6H  . potassium chloride  20 mEq Oral TID  . scopolamine  1 patch Transdermal Q72H  . sodium chloride  3 mL Intravenous Q12H    Continuous Infusions: . Marland KitchenTPN (CLINIMIX-E) Adult 83 mL/hr at 02/24/15 1813   And  . fat emulsion 240 mL (02/24/15 1812)    Wynona Dove, MS Dietetic Intern Pager: [phone]

## 2015-02-25 NOTE — Progress Notes (Signed)
Patient Demographics  Benjamin Blake, is a 64 y.o. male, DOB - [birth year], JKD:[id number]  Admit date - 02/16/2015   Admitting Physician Allie Bossier, MD  Outpatient Primary MD for the patient is Default, Provider, MD  LOS - 8   Chief Complaint  Patient presents with  . Dehydration      Admission history of present illness/brief narrative: Benjamin Blake is a 64 y.o. male, who just moved to the area from [place].He has metastatic colon cancer and presents to our ER for intractable vomiting and diarrhea. His colon cancer was originally diagnosed in 2010. He underwent surgery at that time. Unfortunately they found a recurrence in 2015 with metastases to the liver, peritoneum, and lung. He is undergoing chemotherapy at [facility]. His oncologist is Dr. Reynaldo Minium. His last chemotherapy treatment was 01/31/15. He was feeling fairly well until Thursday 3/10. Developed respiratory distress 3/14 due to apparent aspiration. Transferred to ICU, PCCM consult, intubated on 3/14, patient was extubated on 3/15 , was on TPN, NGT was discontinued on 3/17, at the swallow evaluation, advanced to dysphagia 2 with thick liquid. But noticed to continue have cough after that, had FEES study done, where he was kept nothing by mouth. G-tube was inserted, patient was started on tube feed.  Subjective:   Benjamin Blake today has, No headache, No chest pain, No abdominal pain - No Nausea, still having diarrhea, cough much improved.  Assessment & Plan    Active Problems:   AKI (acute kidney injury)   Hypokalemia   Hyponatremia   Nausea with vomiting   Colon cancer, metastatic   Acute respiratory failure   Aspiration into airway   Abdominal pain   A-fib  Acute hypoxic respiratory failure - Secondary to aspiration pneumonia, post extubation 3/14, extubated on 3/15 - Currently on nasal  cannula, wean as tolerated. - Pulmonary toilet.  Aspiration pneumonia - On IV Zosyn 3/12, transitioned to Rocephin 3/18-3/20. - Sputum culture growing Escherichia coli.   Transient Atrial fibrillation/ sinus tachycardia - New onset, Chadsvasc 2 score <2 , on aspirin. - cardiology consult appreciated - IV metoprolol changed to oral metoprolol.   diarrhea - C. difficile negative 2, most likely related to chemotherapy. - DC IV Reglan, C. difficile negative, once patient tolerated oral intake, will start on Imodium.  Protein calorie malnutrition - Continue with tube feed, DC TPN.  Dysphagia - Continue with tube feed  Hypokalemia - Replaced per pharmacy part of TPN management.  Thrombocytopenia - Most likely related to sepsis. Improving - HIT antibody WNL - Continue to monitor closely, - No evidence of DVT on venous Doppler.  Metastatic colon cancer - patientreceived chemotherapy at [facility], further chemotherapy decisions by [facility] after discharge.   Code Status: Full  Family Communication: None at bedside  Disposition Plan: pending further workup   Procedures  Intubated /14, extubated 3/15   Consults   PCCM   Medications  Scheduled Meds: . antiseptic oral rinse  7 mL Mouth Rinse q12n4p  . aspirin  81 mg Per Tube Daily  . chlorhexidine  15 mL Mouth Rinse BID  . enoxaparin (LOVENOX) injection  40 mg Subcutaneous Q24H  . guaifenesin  200 mg Oral 4 times per day  . insulin aspart  0-9  Units Subcutaneous Q6H  . metoprolol tartrate  25 mg Per Tube Q6H  . potassium chloride  20 mEq Oral TID  . scopolamine  1 patch Transdermal Q72H  . sodium chloride  3 mL Intravenous Q12H   Continuous Infusions: . Marland KitchenTPN (CLINIMIX-E) Adult 83 mL/hr at 02/24/15 1813   And  . fat emulsion 240 mL (02/24/15 1812)  . feeding supplement (JEVITY 1.2 CAL) 1,000 mL (02/25/15 1423)   PRN Meds:.ALPRAZolam, fentaNYL, heparin lock flush, loperamide, ondansetron (ZOFRAN) IV, RESOURCE THICKENUP  CLEAR, sodium chloride  DVT Prophylaxis  - Lovenox  Lab Results  Component Value Date   PLT 179 02/25/2015    Antibiotics    Anti-infectives    Start     Dose/Rate Route Frequency Ordered Stop   02/22/15 1400  cefTRIAXone (ROCEPHIN) 1 g in dextrose 5 % 50 mL IVPB - Premix     1 g 100 mL/hr over 30 Minutes Intravenous Every 24 hours 02/22/15 1313 02/25/15 1454   02/19/15 1400  piperacillin-tazobactam (ZOSYN) IVPB 3.375 g  Status:  Discontinued     3.375 g 12.5 mL/hr over 240 Minutes Intravenous 3 times per day 02/19/15 1009 02/22/15 1232   02/19/15 1200  fluconazole (DIFLUCAN) IVPB 400 mg  Status:  Discontinued     400 mg 100 mL/hr over 120 Minutes Intravenous Every 24 hours 02/18/15 1227 02/19/15 1002   02/18/15 1400  piperacillin-tazobactam (ZOSYN) IVPB 3.375 g  Status:  Discontinued     3.375 g 12.5 mL/hr over 240 Minutes Intravenous Every 8 hours 02/18/15 0731 02/18/15 1227   02/18/15 1400  imipenem-cilastatin (PRIMAXIN) 500 mg in sodium chloride 0.9 % 100 mL IVPB  Status:  Discontinued     500 mg 200 mL/hr over 30 Minutes Intravenous 4 times per day 02/18/15 1237 02/19/15 1002   02/18/15 1300  fluconazole (DIFLUCAN) IVPB 800 mg     800 mg 200 mL/hr over 120 Minutes Intravenous  Once 02/18/15 1227 02/18/15 1618   02/18/15 0800  vancomycin (VANCOCIN) IVPB 750 mg/150 ml premix  Status:  Discontinued     750 mg 150 mL/hr over 60 Minutes Intravenous Every 12 hours 02/18/15 0731 02/19/15 1002   02/18/15 0745  piperacillin-tazobactam (ZOSYN) IVPB 3.375 g     3.375 g 100 mL/hr over 30 Minutes Intravenous  Once 02/18/15 0731 02/18/15 0826          Objective:   Filed Vitals:   02/24/15 1401 02/24/15 2102 02/25/15 0612 02/25/15 1315  BP: 106/77 116/72 116/66 102/56  Pulse: 95 109 103   Temp: 98.3 F (36.8 C) 97.2 F (36.2 C) 98.7 F (37.1 C) 98 F (36.7 C)  TempSrc: Oral Oral Oral Oral  Resp: 18 24 22    Height:      Weight:      SpO2: 96% 99% 95% 99%    Wt  Readings from Last 3 Encounters:  02/18/15 74.4 kg (164 lb 0.4 oz)     Intake/Output Summary (Last 24 hours) at 02/25/15 1604 Last data filed at 02/25/15 1018  Gross per 24 hour  Intake    217 ml  Output   2050 ml  Net  -1833 ml     Physical Exam  Awake Alert,  frail, ill-appearing ,Oriented X 3, No new F.N deficits, Normal affect [place].AT,PERRAL Supple Neck,No JVD, No cervical lymphadenopathy appriciated.  Symmetrical Chest wall movement, Good air movement bilaterally,  Irregular irregular ,No Gallops,Rubs or new Murmurs, No Parasternal Heave +ve B.Sounds, Abd Soft, No tenderness, No organomegaly  appriciated, No rebound - guarding or rigidity. No Cyanosis, Clubbing or edema, No new Rash or bruise     Data Review   Micro Results Recent Results (from the past 240 hour(s))  Clostridium Difficile by PCR     Status: None   Collection Time: 02/16/15 10:01 PM  Result Value Ref Range Status   C difficile by pcr NEGATIVE NEGATIVE Final  MRSA PCR Screening     Status: None   Collection Time: 02/18/15  1:30 AM  Result Value Ref Range Status   MRSA by PCR NEGATIVE NEGATIVE Final    Comment:        The GeneXpert MRSA Assay (FDA approved for NASAL specimens only), is one component of a comprehensive MRSA colonization surveillance program. It is not intended to diagnose MRSA infection nor to guide or monitor treatment for MRSA infections.   Urine culture     Status: None   Collection Time: 02/18/15  1:13 PM  Result Value Ref Range Status   Specimen Description URINE, RANDOM  Final   Special Requests Immunocompromised  Final   Colony Count NO GROWTH Performed at [facility]   Final   Culture NO GROWTH Performed at [facility]   Final   Report Status 02/19/2015 FINAL  Final  Culture, respiratory (NON-Expectorated)     Status: None   Collection Time: 02/18/15  1:20 PM  Result Value Ref Range Status   Specimen Description ENDOTRACHEAL  Final   Special  Requests NONE  Final   Gram Stain   Final    ABUNDANT WBC PRESENT,BOTH PMN AND MONONUCLEAR RARE SQUAMOUS EPITHELIAL CELLS PRESENT FEW GRAM POSITIVE COCCI IN PAIRS RARE YEAST Performed at [facility]    Culture   Final    MODERATE ESCHERICHIA COLI MODERATE CANDIDA ALBICANS Performed at [facility]    Report Status 02/21/2015 FINAL  Final   Organism ID, Bacteria ESCHERICHIA COLI  Final      Susceptibility   Escherichia coli - MIC*    AMPICILLIN >=32 RESISTANT Resistant     AMPICILLIN/SULBACTAM 8 SENSITIVE Sensitive     CEFAZOLIN <=4 SENSITIVE Sensitive     CEFEPIME <=1 SENSITIVE Sensitive     CEFTAZIDIME <=1 SENSITIVE Sensitive     CEFTRIAXONE <=1 SENSITIVE Sensitive     CIPROFLOXACIN <=0.25 SENSITIVE Sensitive     GENTAMICIN <=1 SENSITIVE Sensitive     IMIPENEM <=0.25 SENSITIVE Sensitive     PIP/TAZO <=4 SENSITIVE Sensitive     TOBRAMYCIN <=1 SENSITIVE Sensitive     TRIMETH/SULFA <=20 SENSITIVE Sensitive     * MODERATE ESCHERICHIA COLI  Culture, blood (x 2)     Status: None   Collection Time: 02/18/15  2:00 PM  Result Value Ref Range Status   Specimen Description BLOOD LEFT ARM  Final   Special Requests BOTTLES DRAWN AEROBIC AND ANAEROBIC 3CC  Final   Culture   Final    NO GROWTH 5 DAYS Performed at [facility]    Report Status 02/24/2015 FINAL  Final  Culture, blood (x 2)     Status: None   Collection Time: 02/18/15  2:07 PM  Result Value Ref Range Status   Specimen Description BLOOD LEFT HAND  Final   Special Requests BOTTLES DRAWN AEROBIC AND ANAEROBIC 4CC  Final   Culture   Final    NO GROWTH 5 DAYS Performed at [facility]    Report Status 02/24/2015 FINAL  Final  Clostridium Difficile by PCR  Status: None   Collection Time: 02/19/15 10:11 PM  Result Value Ref Range Status   C difficile by pcr NEGATIVE NEGATIVE Final    Radiology Reports Dg Abd Portable 1v  02/24/2015   CLINICAL DATA:  NG tube placement  EXAM:  PORTABLE ABDOMEN - 1 VIEW  COMPARISON:  02/23/2015  FINDINGS: Enteric tube terminates in the proximal duodenum.  Nonobstructive bowel gas pattern.  Visualized osseous structures are within normal limits.  IMPRESSION: Enteric tube terminates in the proximal duodenum.   Electronically Signed   By: Julian Hy M.D.   On: 02/24/2015 11:10    CBC  Recent Labs Lab 02/20/15 0300 02/21/15 0550 02/22/15 0630 02/23/15 0505 02/24/15 0425 02/25/15 0602  WBC 5.0 6.9 8.8 8.3 8.2 9.1  HGB 11.5* 11.0* 10.6* 10.5* 10.2* 10.1*  HCT 32.8* 31.9* 31.1* 31.7* 30.5* 29.9*  PLT 106* 90* 83* 100* 123* 179  MCV 82.6 82.4 85.0 84.5 85.0 85.4  MCH 29.0 28.4 29.0 28.0 28.4 28.9  MCHC 35.1 34.5 34.1 33.1 33.4 33.8  RDW 17.9* 18.1* 18.8* 19.2* 19.3* 19.5*  LYMPHSABS 0.4*  --   --   --   --  1.3  MONOABS 0.8  --   --   --   --  0.6  EOSABS 0.2  --   --   --   --  0.3  BASOSABS 0.0  --   --   --   --  0.0    Chemistries   Recent Labs Lab 02/20/15 0300 02/21/15 0550 02/22/15 0630 02/23/15 0505 02/24/15 0425 02/25/15 0602  NA 134* 137 141 140 140 139  K 3.3* 2.7* 2.9* 3.5 3.5 3.5  CL 103 102 108 110 111 110  CO2 27 25 29 24 22 22   GLUCOSE 129* 124* 111* 116* 127* 138*  BUN 13 13 17 19 22 19   CREATININE 0.95 0.85 0.89 0.83 0.78 0.79  CALCIUM 7.1* 7.2* 7.6* 7.7* 7.6* 7.8*  MG 1.8 1.9 2.0 1.9 2.1 1.8  AST 71* 69*  --   --   --  89*  ALT 35 29  --   --   --  65*  ALKPHOS 56 62  --   --   --  94  BILITOT 1.1 0.9  --   --   --  1.1   ------------------------------------------------------------------------------------------------------------------ estimated creatinine clearance is 91.5 mL/min (by C-G formula based on Cr of 0.79). ------------------------------------------------------------------------------------------------------------------ No results for input(s): HGBA1C in the last 72  hours. ------------------------------------------------------------------------------------------------------------------  Recent Labs  02/25/15 0602  TRIG 149   ------------------------------------------------------------------------------------------------------------------ No results for input(s): TSH, T4TOTAL, T3FREE, THYROIDAB in the last 72 hours.  Invalid input(s): FREET3 ------------------------------------------------------------------------------------------------------------------ No results for input(s): VITAMINB12, FOLATE, FERRITIN, TIBC, IRON, RETICCTPCT in the last 72 hours.  Coagulation profile No results for input(s): INR, PROTIME in the last 168 hours.  No results for input(s): DDIMER in the last 72 hours.  Cardiac Enzymes  Recent Labs Lab 02/19/15 1030 02/20/15 0300  TROPONINI 0.04* 0.04*   ------------------------------------------------------------------------------------------------------------------ Invalid input(s): POCBNP     Time Spent in minutes   35 minutes   ELGERGAWY, DAWOOD M.D on 02/25/2015 at 4:04 PM  Between 7am to 7pm - Pager - [phone]  After 7pm go to [email or web address] - password [email or web address]  And look for the night coverage person covering for me after hours  [facility] Office  [phone]   **Disclaimer: This note may have been dictated with voice recognition software. Similar sounding words can inadvertently be transcribed  and this note may contain transcription errors which may not have been corrected upon publication of note.**

## 2015-02-25 NOTE — Progress Notes (Signed)
Speech Language Pathology Treatment: Dysphagia  Patient Details Name: Davien Malone MRN: [id number] DOB: [birth year] Today's Date: 02/25/2015 Time: 9892-1194 SLP Time Calculation (min) (ACUTE ONLY): 55 min  Assessment / Plan / Recommendation Clinical Impression  Pt was seen to assess readiness to begin po intake or repeat objective study. Pt had multiple family members present, who report significant improvement in pt status since Friday. Pt completed oral care after set up. Voice quality remains aphonic. Pt was observed with puree, nectar, and thin liquids. Cough response noted only after thin liquids. Family reported cough after each po trial on Friday. Per FEES report, pt exhibited penetration and aspiration of multiple consistencies without reliable cough response. Given improvement in bedside presentation, will repeat FEES (per pt preference) tomorrow. Given results of inconsistent cough response to airway compromise, will continue NPO status until FEES completed. Pt currently has NG tube in place. Recommend removing NG tube prior to FEES. Discussed this plan with pt, family, RN, and MD, and all are in agreement.    HPI HPI: Pt is a 64 y.o. male, recently moved from [place]. History metastatic colon cancer admitted for intractable vomiting and diarrhea. Suspicion for aspiration of emesis. Intubated 3/13-3/15. CXR slightly improved, with a lesser degree of consolidation in the right upper lobe. RN noted difficulty swallowing and initiated order for swallow assessment. FEES completed 02/22/15. SLP in today to assess readiness for po/repeat objective study   Pertinent Vitals Pain Assessment: No/denies pain  SLP Plan  Continue with current plan of care    Recommendations Diet recommendations: NPO Medication Administration: Via alternative means              Oral Care Recommendations: Oral care Q4 per protocol Follow up Recommendations: Inpatient Rehab Plan: Continue with current plan of  care    GO    Lamarcus Spira B. Quentin O[facility], CCC-SLP [phone]  Shonna Chock 02/25/2015, 4:39 PM

## 2015-02-25 NOTE — Progress Notes (Signed)
PARENTERAL NUTRITION CONSULT NOTE - FOLLOW UP  Pharmacy Consult:  TPN Indication:  Intolerance to enteral nutrition, ileus vs SBO  No Known Allergies  Patient Measurements: Height: 5\' 5"  (165.1 cm) Weight: 164 lb 0.4 oz (74.4 kg) IBW/kg (Calculated) : 61.5  Vital Signs: Temp: 98.7 F (37.1 C) (03/21 0612) Temp Source: Oral (03/21 0612) BP: 116/66 mmHg (03/21 0612) Pulse Rate: 103 (03/21 0612) Intake/Output from previous day: 03/20 0701 - 03/21 0700 In: 737 [NG/GT:487; IV Piggyback:250] Out: 2050 [Urine:750; Stool:1300]  Labs:  Recent Labs  02/23/15 0505 02/24/15 0425 02/25/15 0602  WBC 8.3 8.2 9.1  HGB 10.5* 10.2* 10.1*  HCT 31.7* 30.5* 29.9*  PLT 100* 123* 179     Recent Labs  02/23/15 0505 02/24/15 0425 02/25/15 0602  NA 140 140 139  K 3.5 3.5 3.5  CL 110 111 110  CO2 24 22 22   GLUCOSE 116* 127* 138*  BUN 19 22 19   CREATININE 0.83 0.78 0.79  CALCIUM 7.7* 7.6* 7.8*  MG 1.9 2.1 1.8  PHOS  --   --  3.2  PROT  --   --  4.9*  ALBUMIN  --   --  1.4*  AST  --   --  89*  ALT  --   --  65*  ALKPHOS  --   --  94  BILITOT  --   --  1.1  TRIG  --   --  149   Estimated Creatinine Clearance: 91.5 mL/min (by C-G formula based on Cr of 0.79).    Recent Labs  02/24/15 1737 02/25/15 0040 02/25/15 0631  GLUCAP 121* 101* 141*     Insulin Requirements in the past 24 hours:  None required  Assessment: 68 YOM with metastatic colon cancer undergoing chemotherapy, admitted 3/12 with intractable vomiting and diarrhea. CT abdomen showed possible ileus vs early obstruction. Per patients family, he had not eaten x6 days prior to admission due to N/V.    GI: colon cancer with liver + lung mets (Xeloda PTA) - SBO vs ileus - s/p right hemicolectomy on 12/15.  NG clamped and clear liquids started 3/16, diet advanced to dysphagia 3/17. NGT out 3/17.  Failed FEES study 3/18 and made NPO again.  NG replaced 3/19 by MD 3/19 (RN unable to pass feeding tube) then NGT came out  3/20 am accidentally. IV Reglan stopped 3/18.  Jevity started 3/19. Stool output improving.  Endo: TSH WNL.  No hx DM - CBGs controlled, minimal to no SSI use Lytes: WNL except Mag at 1.8 (goal 2 with Afib) and K+ at 3.5 (goal 4) Renal: SCr stable, BUN WNL - decent UOP 0.4 ml/kg/hr Pulm: CT negative for PE.  Extubated, currently on 2L [place] - guaifenesin Cards: no hx (EF 45-50%) - Cards consulted 3/18 for new-onset Afib in setting of asp PNA and hypokalemia - BP controlled, some tachycardia - on ASA, Lopressor Hepatobil: AST/ALT mildly elevated, others WNL.  TG normalized (149).  3/18 HIT negative. Neuro: scopolamine patch ID: Zosyn >> CTX on 3/18 for e.coli PNA, abx stop date 3/21 - afebrile, WBC WNL. 3/18 CXR: slight improvement Best Practices: Lovenox, MC TPN Access: CVC L IJ TPN day#: 7 (3/15>> )  Nutritional Goals: 1900-2200 kCal and 90-110 grams of protein per day  Current Nutrition:  Clinimix E 5/15 at 18ml/hr + IVFE 20% at 24ml/hr Jevity 1.2 started 3/19 at 1714, then NG came out 3/20 at 0005 am   Plan:  - TF being advanced to goal  rate and may d/c TPN post this bag per MD - Decrease TPN to 40 ml/hr then d/c at 1800 tonight - Consider discontinuing SSI/CBG check in AM if CBGs remain at goal TF rate - Mag sulfate 2gm IV x 1 and KCL 39mEq PT TID x 3 doses, further supplementation per MD - D/C TPN labs    Joshue Badal D. Mina Marble, PharmD, BCPS Pager:  [phone] 02/25/2015, 9:16 AM

## 2015-02-26 ENCOUNTER — Inpatient Hospital Stay (HOSPITAL_COMMUNITY): Payer: BLUE CROSS/BLUE SHIELD

## 2015-02-26 DIAGNOSIS — I480 Paroxysmal atrial fibrillation: Secondary | ICD-10-CM

## 2015-02-26 LAB — GLUCOSE, CAPILLARY
GLUCOSE-CAPILLARY: 104 mg/dL — AB (ref 70–99)
GLUCOSE-CAPILLARY: 73 mg/dL (ref 70–99)
GLUCOSE-CAPILLARY: 74 mg/dL (ref 70–99)
Glucose-Capillary: 131 mg/dL — ABNORMAL HIGH (ref 70–99)
Glucose-Capillary: 75 mg/dL (ref 70–99)

## 2015-02-26 LAB — PREALBUMIN: Prealbumin: 8 mg/dL (ref 18.0–45.0)

## 2015-02-26 MED ORDER — METOPROLOL TARTRATE 12.5 MG HALF TABLET
12.5000 mg | ORAL_TABLET | Freq: Two times a day (BID) | ORAL | Status: DC
  Administered 2015-02-27: 12.5 mg via ORAL
  Filled 2015-02-26 (×4): qty 1

## 2015-02-26 MED ORDER — INSULIN ASPART 100 UNIT/ML ~~LOC~~ SOLN: 0.0000 [IU] | Freq: Three times a day (TID) | SUBCUTANEOUS | Status: DC

## 2015-02-26 MED ORDER — SODIUM CHLORIDE 0.9 % IJ SOLN
10.0000 mL | INTRAMUSCULAR | Status: DC | PRN
Start: 2015-02-26 — End: 2015-02-27

## 2015-02-26 MED ORDER — SODIUM CHLORIDE 0.9 % IV SOLN
INTRAVENOUS | Status: DC
  Administered 2015-02-26: 75 mL via INTRAVENOUS
  Administered 2015-02-27: 04:00:00 via INTRAVENOUS

## 2015-02-26 MED ORDER — SODIUM CHLORIDE 0.9 % IJ SOLN
10.0000 mL | Freq: Two times a day (BID) | INTRAMUSCULAR | Status: DC
  Administered 2015-02-27: 10 mL

## 2015-02-26 MED ORDER — WHITE PETROLATUM GEL
Status: AC
  Administered 2015-02-26: 04:00:00
  Filled 2015-02-26: qty 1

## 2015-02-26 NOTE — Progress Notes (Addendum)
Patient Demographics  Benjamin Blake, is a 64 y.o. male, DOB - 11/30/1951, WIO:[id number]  Admit date - 02/16/2015   Admitting Physician Allie Bossier, MD  Outpatient Primary MD for the patient is Default, Provider, MD  LOS - 9   Chief Complaint  Patient presents with  . Dehydration      Admission history of present illness/brief narrative: Benjamin Blake is a 64 y.o. male, who just moved to the area from [place].He has metastatic colon cancer and presents to our ER for intractable vomiting and diarrhea. His colon cancer was originally diagnosed in 2010. He underwent surgery at that time. Unfortunately they found a recurrence in 2015 with metastases to the liver, peritoneum, and lung. He is undergoing chemotherapy at [facility]. His oncologist is Dr. Reynaldo Minium. His last chemotherapy treatment was 01/31/15. He was feeling fairly well until Thursday 3/10. Developed respiratory distress 3/14 due to apparent aspiration. Transferred to ICU, PCCM consult, intubated on 3/14, patient was extubated on 3/15 , was on TPN, NGT was discontinued on 3/17, at the swallow evaluation, advanced to dysphagia 2 with thick liquid. But noticed to continue have cough after that, had FEES study done, where he was kept nothing by mouth. G-tube was inserted, patient was started on tube feed, tolerated very well, and TPN was stopped 3/21, had repeat FEES on 3/22, advanced to dysphagia 1 with high thick fluids.  Subjective:   Benjamin Blake today has, No headache, No chest pain, No abdominal pain - No Nausea,no diarrhea or cough.  Active Problems:   AKI (acute kidney injury)   Hypokalemia   Hyponatremia   Nausea with vomiting   Colon cancer, metastatic   Acute respiratory failure   Aspiration into airway   Abdominal pain   A-fib  Acute hypoxic respiratory failure - Secondary to aspiration  pneumonia, post extubation 3/14, extubated on 3/15 -  resolved, on room aironary toilet.  Aspiration pneumona  -  treated with Zosyn 3/12, transitioned to Rocephin 3/18-3/20. - Sputum culture growing Escherichia coli.   Transient Atrial fibrillation/ sinus tachycardia - New onset, Chadsvasc 2 score <2 , on aspirin. - cardiology consult appreciated - On oral metoprolol   diarrhea - C. difficile negative 2, most likely related to chemotherapy. - DC IV Reglan, improving on Imodium.  Protein calorie malnutrition - Initially on TPN, changed to tube feed, currently diet is advanced to dysphagia 1 with honey thick  Dysphagia - FEES on 3/18 and 3/22 on dysphagia 1 with honey thick fluid   Hypokalemia - Replaced , continue to monitor   Thrombocytopenia - Most likely related to sepsis. Improving - HIT antibody WNL - Continue to monitor closely, - No evidence of DVT on venous Doppler.  Metastatic colon cancer - patientreceived chemotherapy at [facility], further chemotherapy decisions by [facility] after discharge.   Code Status: Full  Family Communication: discussed with wife at bedside  Disposition: Home with home care and 24 hour if remains stable Procedures intubated /14, extubated 3/15   Consults   PCCM   Medications  Scheduled Meds: . antiseptic oral rinse  7 mL Mouth Rinse q12n4p  . aspirin  81 mg Per Tube Daily  . chlorhexidine  15 mL Mouth Rinse BID  . enoxaparin (LOVENOX) injection  40 mg Subcutaneous Q24H  . guaifenesin  200 mg Oral 4 times per day  . insulin aspart  0-9 Units Subcutaneous Q6H  . metoprolol tartrate  25 mg Per Tube Q6H  . scopolamine  1 patch Transdermal Q72H  . sodium chloride  3 mL Intravenous Q12H   Continuous Infusions: . feeding supplement (JEVITY 1.2 CAL) 1,000 mL (02/26/15 0612)   PRN Meds:.ALPRAZolam, fentaNYL, heparin lock flush, loperamide, ondansetron (ZOFRAN) IV, RESOURCE THICKENUP CLEAR, sodium chloride  DVT Prophylaxis  -  Lovenox  Lab Results  Component Value Date   PLT 179 02/25/2015    Antibiotics    Anti-infectives    Start     Dose/Rate Route Frequency Ordered Stop   02/22/15 1400  cefTRIAXone (ROCEPHIN) 1 g in dextrose 5 % 50 mL IVPB - Premix     1 g 100 mL/hr over 30 Minutes Intravenous Every 24 hours 02/22/15 1313 02/25/15 1454   02/19/15 1400  piperacillin-tazobactam (ZOSYN) IVPB 3.375 g  Status:  Discontinued     3.375 g 12.5 mL/hr over 240 Minutes Intravenous 3 times per day 02/19/15 1009 02/22/15 1232   02/19/15 1200  fluconazole (DIFLUCAN) IVPB 400 mg  Status:  Discontinued     400 mg 100 mL/hr over 120 Minutes Intravenous Every 24 hours 02/18/15 1227 02/19/15 1002   02/18/15 1400  piperacillin-tazobactam (ZOSYN) IVPB 3.375 g  Status:  Discontinued     3.375 g 12.5 mL/hr over 240 Minutes Intravenous Every 8 hours 02/18/15 0731 02/18/15 1227   02/18/15 1400  imipenem-cilastatin (PRIMAXIN) 500 mg in sodium chloride 0.9 % 100 mL IVPB  Status:  Discontinued     500 mg 200 mL/hr over 30 Minutes Intravenous 4 times per day 02/18/15 1237 02/19/15 1002   02/18/15 1300  fluconazole (DIFLUCAN) IVPB 800 mg     800 mg 200 mL/hr over 120 Minutes Intravenous  Once 02/18/15 1227 02/18/15 1618   02/18/15 0800  vancomycin (VANCOCIN) IVPB 750 mg/150 ml premix  Status:  Discontinued     750 mg 150 mL/hr over 60 Minutes Intravenous Every 12 hours 02/18/15 0731 02/19/15 1002   02/18/15 0745  piperacillin-tazobactam (ZOSYN) IVPB 3.375 g     3.375 g 100 mL/hr over 30 Minutes Intravenous  Once 02/18/15 0731 02/18/15 0826          Objective:   Filed Vitals:   02/26/15 0509 02/26/15 1053 02/26/15 1113 02/26/15 1114  BP: 111/64 96/66 91/56  91/56  Pulse: 90 96 91   Temp: 97.6 F (36.4 C) 97.5 F (36.4 C)    TempSrc: Oral     Resp: 20     Height:      Weight:      SpO2: 96% 95%      Wt Readings from Last 3 Encounters:  02/18/15 74.4 kg (164 lb 0.4 oz)    No intake or output data in the 24  hours ending 02/26/15 1258   Physical Exam  Awake Alert,  frail, ill-appearing ,Oriented X 3, No new F.N deficits, Normal affect [place].AT,PERRAL Supple Neck,No JVD, No cervical lymphadenopathy appriciated.  Symmetrical Chest wall movement, Good air movement bilaterally,  Irregular irregular ,No Gallops,Rubs or new Murmurs, No Parasternal Heave +ve B.Sounds, Abd Soft, No tenderness, No organomegaly appriciated, No rebound - guarding or rigidity. No Cyanosis, Clubbing or edema, No new Rash or bruise     Data Review   Micro Results Recent Results (from the past 240 hour(s))  Clostridium Difficile by PCR  Status: None   Collection Time: 02/16/15 10:01 PM  Result Value Ref Range Status   C difficile by pcr NEGATIVE NEGATIVE Final  MRSA PCR Screening     Status: None   Collection Time: 02/18/15  1:30 AM  Result Value Ref Range Status   MRSA by PCR NEGATIVE NEGATIVE Final    Comment:        The GeneXpert MRSA Assay (FDA approved for NASAL specimens only), is one component of a comprehensive MRSA colonization surveillance program. It is not intended to diagnose MRSA infection nor to guide or monitor treatment for MRSA infections.   Urine culture     Status: None   Collection Time: 02/18/15  1:13 PM  Result Value Ref Range Status   Specimen Description URINE, RANDOM  Final   Special Requests Immunocompromised  Final   Colony Count NO GROWTH Performed at [facility]   Final   Culture NO GROWTH Performed at [facility]   Final   Report Status 02/19/2015 FINAL  Final  Culture, respiratory (NON-Expectorated)     Status: None   Collection Time: 02/18/15  1:20 PM  Result Value Ref Range Status   Specimen Description ENDOTRACHEAL  Final   Special Requests NONE  Final   Gram Stain   Final    ABUNDANT WBC PRESENT,BOTH PMN AND MONONUCLEAR RARE SQUAMOUS EPITHELIAL CELLS PRESENT FEW GRAM POSITIVE COCCI IN PAIRS RARE YEAST Performed at [facility]     Culture   Final    MODERATE ESCHERICHIA COLI MODERATE CANDIDA ALBICANS Performed at [facility]    Report Status 02/21/2015 FINAL  Final   Organism ID, Bacteria ESCHERICHIA COLI  Final      Susceptibility   Escherichia coli - MIC*    AMPICILLIN >=32 RESISTANT Resistant     AMPICILLIN/SULBACTAM 8 SENSITIVE Sensitive     CEFAZOLIN <=4 SENSITIVE Sensitive     CEFEPIME <=1 SENSITIVE Sensitive     CEFTAZIDIME <=1 SENSITIVE Sensitive     CEFTRIAXONE <=1 SENSITIVE Sensitive     CIPROFLOXACIN <=0.25 SENSITIVE Sensitive     GENTAMICIN <=1 SENSITIVE Sensitive     IMIPENEM <=0.25 SENSITIVE Sensitive     PIP/TAZO <=4 SENSITIVE Sensitive     TOBRAMYCIN <=1 SENSITIVE Sensitive     TRIMETH/SULFA <=20 SENSITIVE Sensitive     * MODERATE ESCHERICHIA COLI  Culture, blood (x 2)     Status: None   Collection Time: 02/18/15  2:00 PM  Result Value Ref Range Status   Specimen Description BLOOD LEFT ARM  Final   Special Requests BOTTLES DRAWN AEROBIC AND ANAEROBIC 3CC  Final   Culture   Final    NO GROWTH 5 DAYS Performed at [facility]    Report Status 02/24/2015 FINAL  Final  Culture, blood (x 2)     Status: None   Collection Time: 02/18/15  2:07 PM  Result Value Ref Range Status   Specimen Description BLOOD LEFT HAND  Final   Special Requests BOTTLES DRAWN AEROBIC AND ANAEROBIC 4CC  Final   Culture   Final    NO GROWTH 5 DAYS Performed at [facility]    Report Status 02/24/2015 FINAL  Final  Clostridium Difficile by PCR     Status: None   Collection Time: 02/19/15 10:11 PM  Result Value Ref Range Status   C difficile by pcr NEGATIVE NEGATIVE Final    Radiology Reports Dg Swallowing Func-speech Pathology  02/26/2015    Objective Swallowing  Evaluation:   Completed by Rodena Goldmann,  supervised by [name], [place]  Patient Details  Name: Benjamin Blake MRN: [id number] Date of Birth: 1951/01/15  Today's Date: 02/26/2015 Time: SLP Start Time (ACUTE ONLY):  1535-SLP Stop Time (ACUTE ONLY): 1630 SLP Time Calculation (min) (ACUTE ONLY): 55 min  Past Medical History:  Past Medical History  Diagnosis Date  . Cancer    Past Surgical History:  Past Surgical History  Procedure Laterality Date  . Colon surgery     HPI:  HPI: Pt is a 64 y.o. male, recently moved from [place]. History  metastatic colon cancer admitted for intractable vomiting and diarrhea.  Suspicion for aspiration of emesis. Intubated 3/13-3/15. CXR slightly  improved, with a lesser degree of consolidation in the right upper lobe.  RN noted difficulty swallowing and initiated order for swallow assessment.  FEES completed 02/22/15. SLP in today to assess readiness for po/repeat  objective study  No Data Recorded  Assessment / Plan / Recommendation CHL IP CLINICAL IMPRESSIONS 02/26/2015  Dysphagia Diagnosis Mild oral phase dysphagia;Moderate pharyngeal phase  dysphagia  Clinical impression Pt presents with severe pharyngeal phase dysphagia  characterized by reduced base of tongue retraction, reduced epiglottic  inversion, delayed swallow initiation to the pyriform with thin liquids  and to the valleculae across all other consistencies. Pt silently  aspirated with thin and nectar thick liquids during and after the swallow.  Pt able to expel aspirate when given cue to cough with nectar; unable to  expel aspirate with thin. Chin tuck and supra glottic swallow innefective.  Pt able to improved function when given honey thick liquids and asked to  turn head to right side during swallow with tube in place. When NG removed  pt reassesed with honey-thick liquids with head turn; no aspiration noted;  reduced vallecular residue; unsure why strategy improved function, suspect  might have improved airway closure and pharyngeal contraction. Pt still at  high risk for aspiration given delayed swallow initiation, reduced airway  protection due to reduced epiglottic inversion and reduced base of tongue  retraction.   Recommend pt initiate dys 1/ honey thick liquid diet and a  head turn to right side during swallow; no straws; slow rate; meds crushed  in applesauce. Speech will follow-up for diet tolerance, possible upgrade  and pharyngeal strengthening exercises.       CHL IP TREATMENT RECOMMENDATION 02/26/2015  Treatment Plan Recommendations Therapy as outlined in treatment plan below      CHL IP DIET RECOMMENDATION 02/26/2015  Diet Recommendations Dysphagia 1 (Puree);Honey-thick liquid  Liquid Administration via Cup;No straw  Medication Administration Crushed with puree  Compensations Slow rate;Small sips/bites  Postural Changes and/or Swallow Maneuvers Seated upright 90 degrees     CHL IP OTHER RECOMMENDATIONS 02/26/2015  Recommended Consults (None)  Oral Care Recommendations Oral care Q4 per protocol  Other Recommendations Order thickener from pharmacy     CHL IP FOLLOW UP RECOMMENDATIONS 02/25/2015  Follow up Recommendations Inpatient Rehab     CHL IP FREQUENCY AND DURATION 02/26/2015  Speech Therapy Frequency (ACUTE ONLY) min 2x/week  Treatment Duration 2 weeks     Pertinent Vitals/Pain NA    SLP Swallow Goals No flowsheet data found.  No flowsheet data found.    CHL IP REASON FOR REFERRAL 02/26/2015  Reason for Referral Objectively evaluate swallowing function     CHL IP ORAL PHASE 02/26/2015  Lips (None)  Tongue (None)  Mucous membranes (None)  Nutritional status (None)  Other (None)  Oxygen therapy (None)  Oral Phase WFL  Oral - Pudding Teaspoon (None)  Oral - Pudding Cup (None)  Oral - Honey Teaspoon (None)  Oral - Honey Cup (None)  Oral - Honey Syringe (None)  Oral - Nectar Teaspoon (None)  Oral - Nectar Cup (None)  Oral - Nectar Straw (None)  Oral - Nectar Syringe (None)  Oral - Ice Chips (None)  Oral - Thin Teaspoon (None)  Oral - Thin Cup (None)  Oral - Thin Straw (None)  Oral - Thin Syringe (None)  Oral - Puree (None)  Oral - Mechanical Soft (None)  Oral - Regular (None)  Oral - Multi-consistency (None)  Oral - Pill  (None)  Oral Phase - Comment (None)      CHL IP PHARYNGEAL PHASE 02/26/2015  Pharyngeal Phase Impaired  Pharyngeal - Pudding Teaspoon (None)  Penetration/Aspiration details (pudding teaspoon) (None)  Pharyngeal - Pudding Cup (None)  Penetration/Aspiration details (pudding cup) (None)  Pharyngeal - Honey Teaspoon (None)  Penetration/Aspiration details (honey teaspoon) (None)  Pharyngeal - Honey Cup Delayed swallow initiation;Premature spillage to  valleculae;Pharyngeal residue - valleculae;Pharyngeal residue - pyriform  sinuses  Penetration/Aspiration details (honey cup) (None)  Pharyngeal - Honey Syringe (None)  Penetration/Aspiration details (honey syringe) (None)  Pharyngeal - Nectar Teaspoon Delayed swallow initiation;Premature spillage  to valleculae;Premature spillage to pyriform sinuses;Reduced tongue base  retraction;Penetration/Aspiration during swallow  Penetration/Aspiration details (nectar teaspoon) Material enters airway,  passes BELOW cords and not ejected out despite cough attempt by  patient;Material does not enter airway  Pharyngeal - Nectar Cup Delayed swallow initiation;Premature spillage to  valleculae;Penetration/Aspiration during swallow  Penetration/Aspiration details (nectar cup) Material enters airway, passes  BELOW cords and not ejected out despite cough attempt by patient;Material  does not enter airway  Pharyngeal - Nectar Straw (None)  Penetration/Aspiration details (nectar straw) (None)  Pharyngeal - Nectar Syringe (None)  Penetration/Aspiration details (nectar syringe) (None)  Pharyngeal - Ice Chips NT  Penetration/Aspiration details (ice chips) (None)  Pharyngeal - Thin Teaspoon (None)  Penetration/Aspiration details (thin teaspoon) (None)  Pharyngeal - Thin Cup Delayed swallow initiation;Premature spillage to  pyriform sinuses;Premature spillage to valleculae;Penetration/Aspiration  during swallow  Penetration/Aspiration details (thin cup) Material enters airway, passes  BELOW cords  and not ejected out despite cough attempt by patient  Pharyngeal - Thin Straw (None)  Penetration/Aspiration details (thin straw) (None)  Pharyngeal - Thin Syringe (None)  Penetration/Aspiration details (thin syringe') (None)  Pharyngeal - Puree Delayed swallow initiation;Premature spillage to  valleculae  Penetration/Aspiration details (puree) (None)  Pharyngeal - Mechanical Soft (None)  Penetration/Aspiration details (mechanical soft) (None)  Pharyngeal - Regular Delayed swallow initiation  Penetration/Aspiration details (regular) (None)  Pharyngeal - Multi-consistency (None)  Penetration/Aspiration details (multi-consistency) (None)  Pharyngeal - Pill (None)  Penetration/Aspiration details (pill) (None)  Pharyngeal Comment (None)     CHL IP CERVICAL ESOPHAGEAL PHASE 02/26/2015  Cervical Esophageal Phase WFL  Pudding Teaspoon (None)  Pudding Cup (None) Honey Teaspoon (None)  Honey Cup (None)  Honey Syringe (None)  Nectar Teaspoon (None)  Nectar Cup (None)  Nectar Straw (None)  Nectar Syringe (None)  Thin Teaspoon (None)  Thin Cup (None)  Thin Straw (None)  Thin Syringe (None)  Cervical Esophageal Comment (None)    No flowsheet data found.  Rodena Goldmann, SLP Student  Supervised and reviewed by [facility] MA CCC-SLP        Lynann Beaver 02/26/2015, 12:05 PM     CBC  Recent Labs Lab 02/20/15 0300 02/21/15 0550 02/22/15  0630 02/23/15 0505 02/24/15 0425 02/25/15 0602  WBC 5.0 6.9 8.8 8.3 8.2 9.1  HGB 11.5* 11.0* 10.6* 10.5* 10.2* 10.1*  HCT 32.8* 31.9* 31.1* 31.7* 30.5* 29.9*  PLT 106* 90* 83* 100* 123* 179  MCV 82.6 82.4 85.0 84.5 85.0 85.4  MCH 29.0 28.4 29.0 28.0 28.4 28.9  MCHC 35.1 34.5 34.1 33.1 33.4 33.8  RDW 17.9* 18.1* 18.8* 19.2* 19.3* 19.5*  LYMPHSABS 0.4*  --   --   --   --  1.3  MONOABS 0.8  --   --   --   --  0.6  EOSABS 0.2  --   --   --   --  0.3  BASOSABS 0.0  --   --   --   --  0.0    Chemistries   Recent Labs Lab 02/20/15 0300 02/21/15 0550  02/22/15 0630 02/23/15 0505 02/24/15 0425 02/25/15 0602  NA 134* 137 141 140 140 139  K 3.3* 2.7* 2.9* 3.5 3.5 3.5  CL 103 102 108 110 111 110  CO2 27 25 29 24 22 22   GLUCOSE 129* 124* 111* 116* 127* 138*  BUN 13 13 17 19 22 19   CREATININE 0.95 0.85 0.89 0.83 0.78 0.79  CALCIUM 7.1* 7.2* 7.6* 7.7* 7.6* 7.8*  MG 1.8 1.9 2.0 1.9 2.1 1.8  AST 71* 69*  --   --   --  89*  ALT 35 29  --   --   --  65*  ALKPHOS 56 62  --   --   --  94  BILITOT 1.1 0.9  --   --   --  1.1   ------------------------------------------------------------------------------------------------------------------ estimated creatinine clearance is 91.5 mL/min (by C-G formula based on Cr of 0.79). ------------------------------------------------------------------------------------------------------------------ No results for input(s): HGBA1C in the last 72 hours. ------------------------------------------------------------------------------------------------------------------  Recent Labs  02/25/15 0602  TRIG 149   ------------------------------------------------------------------------------------------------------------------ No results for input(s): TSH, T4TOTAL, T3FREE, THYROIDAB in the last 72 hours.  Invalid input(s): FREET3 ------------------------------------------------------------------------------------------------------------------ No results for input(s): VITAMINB12, FOLATE, FERRITIN, TIBC, IRON, RETICCTPCT in the last 72 hours.  Coagulation profile No results for input(s): INR, PROTIME in the last 168 hours.  No results for input(s): DDIMER in the last 72 hours.  Cardiac Enzymes  Recent Labs Lab 02/20/15 0300  TROPONINI 0.04*   ------------------------------------------------------------------------------------------------------------------ Invalid input(s): POCBNP     Time Spent in minutes   35 minutes   Dynver Clemson M.D on 02/26/2015 at 12:58 PM  Between 7am to 7pm - Pager -  [phone]  After 7pm go to [email or web address] - password [email or web address]  And look for the night coverage person covering for me after hours  [facility] Office  [phone]   **Disclaimer: This note may have been dictated with voice recognition software. Similar sounding words can inadvertently be transcribed and this note may contain transcription errors which may not have been corrected upon publication of note.**

## 2015-02-26 NOTE — Progress Notes (Signed)
MBSS complete. Full report located under chart review in imaging section. Haidar Muse, MA CCC-SLP [phone]  

## 2015-02-26 NOTE — Progress Notes (Signed)
Speech Language Pathology Treatment: Dysphagia  Patient Details Name: Benjamin Blake MRN: [id number] DOB: 1951/09/10 Today's Date: 02/26/2015 Time: 0051-1021 SLP Time Calculation (min) (ACUTE ONLY): 20 min  Assessment / Plan / Recommendation Clinical Impression  SLP reviewed and demonstrated pharyngeal strengthening exercises to pt and caregiver (wife). Discussed that pt  can use these exercises to strengthen laryngeal and pharyngeal muscles, improve airway protection/closure and base of tongue weakness. SLP reinforced compensatory strategy (right head turn) during swallow with PO intake and demonstrated to pt's caregiver how to thicken liquids to a honey consistency. Recommended pt perform pharyngeal exercises at least 3 times per day and to discontinue exercises if pt feels pain or discomfort. Speech will follow-up with diet tolerance and dysphagia therapy.   HPI HPI: Pt is a 64 y.o. male, recently moved from [place]. History metastatic colon cancer admitted for intractable vomiting and diarrhea. Suspicion for aspiration of emesis. Intubated 3/13-3/15. CXR slightly improved, with a lesser degree of consolidation in the right upper lobe. RN noted difficulty swallowing and initiated order for swallow assessment. FEES completed 02/22/15. SLP in today to assess readiness for po/repeat objective study   Pertinent Vitals    SLP Plan  Continue with current plan of care    Recommendations Diet recommendations: Dysphagia 1 (puree);Honey-thick liquid Liquids provided via: Cup Medication Administration: Crushed with puree Supervision: Full supervision/cueing for compensatory strategies Compensations: Slow rate;Small sips/bites Postural Changes and/or Swallow Maneuvers: Seated upright 90 degrees              Oral Care Recommendations: Oral care Q4 per protocol Follow up Recommendations: Inpatient Rehab Plan: Continue with current plan of care    GO     Bermudez-Bosch, Joeph Szatkowski 02/26/2015,  2:31 PM

## 2015-02-27 DIAGNOSIS — R34 Anuria and oliguria: Secondary | ICD-10-CM

## 2015-02-27 LAB — GLUCOSE, CAPILLARY
Glucose-Capillary: 73 mg/dL (ref 70–99)
Glucose-Capillary: 92 mg/dL (ref 70–99)

## 2015-02-27 MED ORDER — ONDANSETRON HCL 8 MG PO TABS: 8.0000 mg | ORAL_TABLET | Freq: Three times a day (TID) | ORAL | Status: DC | PRN

## 2015-02-27 MED ORDER — BOOST PLUS PO LIQD: 237.0000 mL | Freq: Three times a day (TID) | ORAL | Status: DC

## 2015-02-27 MED ORDER — SENNOSIDES-DOCUSATE SODIUM 8.6-50 MG PO TABS: 1.0000 | ORAL_TABLET | Freq: Every day | ORAL | Status: DC | PRN

## 2015-02-27 MED ORDER — GUAIFENESIN 100 MG/5ML PO SYRP: 200.0000 mg | ORAL_SOLUTION | Freq: Four times a day (QID) | ORAL | Status: DC

## 2015-02-27 MED ORDER — METOPROLOL TARTRATE 25 MG PO TABS: 12.5000 mg | ORAL_TABLET | Freq: Two times a day (BID) | ORAL | Status: DC

## 2015-02-27 MED ORDER — ONDANSETRON HCL 8 MG PO TABS: 8.0000 mg | ORAL_TABLET | Freq: Three times a day (TID) | ORAL | Status: AC | PRN

## 2015-02-27 MED ORDER — METOPROLOL TARTRATE 25 MG PO TABS
12.5000 mg | ORAL_TABLET | Freq: Two times a day (BID) | ORAL | Status: DC
Start: 2015-02-27 — End: 2015-04-19

## 2015-02-27 MED ORDER — PROCHLORPERAZINE MALEATE 10 MG PO TABS: 10.0000 mg | ORAL_TABLET | Freq: Four times a day (QID) | ORAL | Status: DC | PRN

## 2015-02-27 MED ORDER — RESOURCE THICKENUP CLEAR PO POWD: ORAL | Status: DC

## 2015-02-27 NOTE — Progress Notes (Signed)
Speech Language Pathology Treatment: Dysphagia  Patient Details Name: Benjamin Blake MRN: [id number] DOB: 03-09-1951 Today's Date: 02/27/2015 Time: 2248-2500 SLP Time Calculation (min) (ACUTE ONLY): 35 min  Assessment / Plan / Recommendation Clinical Impression  Skilled treatment session focused on addressing education and dysphagia goals.  SLP facilitated session with review of MBS results and education regarding implications.  Patient and wife questioned readiness to upgrade diet and liquids today; SLP discussed findings of silent aspiration with thin and nectar consistencies as well as rationale for upgrading in a few days to ensure toleration of current recommendations.  Wife with appropriate questions regarding diet recommendations and how to prepare at home as well as follow up recommendations.  SLP discussed options for how to puree food and attached document to exit care to be included with discharge summary, educated on procedures oral care prior to ice chips and advised they discuss specific follow up therapy questions with Case Manager.   SLP also facilitated session with skilled observation of patient consuming honey-thick liquids via cup and Min verbal cues for patient to recall and utilize safe swallow strategy of right head turn with each sip.  Patient performed pharyngeal strengthening exercises with Min verbal and tactile cues for accuracy.  Recommend to continue with current diet and skilled SLP intervention to maximize swallow function.      HPI HPI: Pt is a 64 y.o. male, recently moved from [place]. History metastatic colon cancer admitted for intractable vomiting and diarrhea. Suspicion for aspiration of emesis. Intubated 3/13-3/15. CXR slightly improved, with a lesser degree of consolidation in the right upper lobe. RN noted difficulty swallowing and initiated order for swallow assessment. FEES completed 02/22/15. MBS completed 02/26/15 recommended initiation of a Dys.1 texture  and honey-thick liquid diet.     Pertinent Vitals Pain Assessment: No/denies pain  SLP Plan  Continue with current plan of care    Recommendations Diet recommendations: Dysphagia 1 (puree);Honey-thick liquid Liquids provided via: Cup Medication Administration: Crushed with puree Supervision: Full supervision/cueing for compensatory strategies;Patient able to self feed Compensations: Slow rate;Small sips/bites Postural Changes and/or Swallow Maneuvers: Seated upright 90 degrees;Head turn right during swallow              Oral Care Recommendations: Oral care BID;Oral care prior to ice chips Follow up Recommendations: Home health SLP;Outpatient SLP;Inpatient Rehab Plan: Continue with current plan of care    GO    Benjamin Blake., CCC-SLP [phone]  [place] 02/27/2015, 10:54 AM

## 2015-02-27 NOTE — Progress Notes (Signed)
NCM spoke with wife, she states they only want speech therapy he is getting around pretty well , will not need other services.  NCM will notify [facility].

## 2015-02-27 NOTE — Discharge Summary (Addendum)
Benjamin Blake, is a 64 y.o. male  DOB 03-13-1951  MRN [id number].  Admission date:  02/16/2015  Admitting Physician  Allie Bossier, MD  Discharge Date:  02/27/2015   Primary MD  Default, Provider, MD  Recommendations for primary care physician for things to follow:    Repeat CBC, CMP and a 2 view chest x-ray in a week.  Must follow with his primary oncologist and home speech therapy closely.   Admission Diagnosis  Oliguria [R34] Hyponatremia [E87.1] Vomiting [R11.10] SOB (shortness of breath) [R06.02] AKI (acute kidney injury) [N17.9] Abdominal pain [R10.9]   Discharge Diagnosis  Oliguria [R34] Hyponatremia [E87.1] Vomiting [R11.10] SOB (shortness of breath) [R06.02] AKI (acute kidney injury) [N17.9] Abdominal pain [R10.9]     Active Problems:   AKI (acute kidney injury)   Hypokalemia   Hyponatremia   Nausea with vomiting   Colon cancer, metastatic   Acute respiratory failure   Aspiration into airway   Abdominal pain   A-fib      Past Medical History  Diagnosis Date  . Cancer     Past Surgical History  Procedure Laterality Date  . Colon surgery         History of present illness and  Hospital Course:     Kindly see H&P for history of present illness and admission details, please review complete Labs, Consult reports and Test reports for all details in brief  HPI     Benjamin Blake is a 64 y.o. male, who just moved to the area from [place].He has metastatic colon cancer and presents to our ER for intractable vomiting and diarrhea. His colon cancer was originally diagnosed in 2010. He underwent surgery at that time. Unfortunately they found a recurrence in 2015 with metastases to the liver, peritoneum, and lung. He is undergoing chemotherapy at [facility]. His oncologist is Dr.  Reynaldo Minium. His last chemotherapy treatment was 01/31/15. He was feeling fairly well until Thursday 3/10.  Developed respiratory distress 3/14 due to apparent aspiration. Transferred to ICU, PCCM consult, intubated on 3/14, patient was extubated on 3/15 , was on TPN, NGT was discontinued on 3/17, at the swallow evaluation, advanced to dysphagia 2 with thick liquid.  But noticed to continue have cough after that, had FEES study done, where he was kept nothing by mouth. NG-tube was inserted, patient was started on tube feed, tolerated very well, and TPN was stopped 3/21, had repeat FEES on 3/22, advanced to dysphagia 1 with high thick fluids.   Hospital Course    Acute hypoxic respiratory failure - Secondary to aspiration pneumonia, post intubation 3/14, extubated on 3/15 - resolved, on room aironary toilet. - Pneumonia was treated with Zosyn and Rocephin, it grew Escherichia coli, he has finished his treatment. treated with Zosyn 3/12, transitioned to  -Speech following currently on dysphagia 1 diet with honey thick liquids, will go home on this with feeding assistance aspiration precautions and home speech treatments.   Transient Atrial fibrillation/ sinus tachycardia - New onset, Chadsvasc 2 score <2 ,  on aspirin. - cardiology consult appreciated on Will follow with cardiology post discharge - On oral metoprolol    Moderate Protein calorie malnutrition - Placed on protein supplementation  Dysphagia - FEES on 3/18 and 3/22 on dysphagia 1 with honey thick fluid, will get home speech therapy with continued feeding assistance and aspiration precautions   Hypokalemia - Replaced , continue to monitor in the outpatient setting  Thrombocytopenia - Most likely related to sepsis. Resolved  Metastatic colon cancer - patientreceived chemotherapy at [place] with Dr. Reynaldo Minium, further chemotherapy decisions by [facility] after discharge.     Discharge Condition: Stable   Follow  UP  Follow-up Information    Follow up with [facility].   Why:  HHRN/ HHPT/st/ot/aide   Contact information:   [address] [place] [address] [address] [phone]       Follow up with Lujean Amel, MD On 03/04/2015.   Specialty:  Family Medicine   Why:  10:30 for hospital follow up   Contact information:   [address] [address] [phone]       Follow up with [facility]    . Schedule an appointment as soon as possible for a visit on 03/01/2015.   Why:  Appointment is on 03/01/15 at [facility] information:   [place] [id number] [phone]      Follow up with Clelia Croft, MD. Schedule an appointment as soon as possible for a visit in 1 week.   Specialty:  Internal Medicine   Contact information:   [facility] [place] [address] [phone]       Follow up with CROITORU,MIHAI, MD. Schedule an appointment as soon as possible for a visit in 1 week.   Specialty:  Cardiology   Why:  Atrial fibrillation   Contact information:   [address] [place] [address] [phone]         Discharge Instructions  and  Discharge Medications          Discharge Instructions    Discharge instructions    Complete by:  As directed   Follow with Primary MD  in 7 days   Get CBC, CMP, 2 view Chest X ray checked  by Primary MD next visit.    Activity: As tolerated with Full fall precautions use walker/cane & assistance as needed   Disposition Home    Diet: Aphasia 1 diet with honey thick liquids with feeding assistance and aspiration precautions .  For Heart failure patients - Check your Weight same time everyday, if you gain over 2 pounds, or you develop in leg swelling, experience more shortness of breath or chest pain, call your Primary MD immediately. Follow Cardiac Low Salt Diet and 1.5 lit/day  fluid restriction.   On your next visit with your primary care physician please Get Medicines reviewed and adjusted.   Please request your Prim.MD to go over all Hospital Tests and Procedure/Radiological results at the follow up, please get all Hospital records sent to your Prim MD by signing hospital release before you go home.   If you experience worsening of your admission symptoms, develop shortness of breath, life threatening emergency, suicidal or homicidal thoughts you must seek medical attention immediately by calling 911 or calling your MD immediately  if symptoms less severe.  You Must read complete instructions/literature along with all the possible adverse reactions/side effects for  all the Medicines you take and that have been prescribed to you. Take any new Medicines after you have completely understood and accpet all the possible adverse reactions/side effects.   Do not drive, operating heavy machinery, perform activities at heights, swimming or participation in water activities or provide baby sitting services if your were admitted for syncope or siezures until you have seen by Primary MD or a Neurologist and advised to do so again.  Do not drive when taking Pain medications.    Do not take more than prescribed Pain, Sleep and Anxiety Medications  Special Instructions: If you have smoked or chewed Tobacco  in the last 2 yrs please stop smoking, stop any regular Alcohol  and or any Recreational drug use.  Wear Seat belts while driving.   Please note  You were cared for by a hospitalist during your hospital stay. If you have any questions about your discharge medications or the care you received while you were in the hospital after you are discharged, you can call the unit and asked to speak with the hospitalist on call if the hospitalist that took care of you is not available. Once you are discharged, your primary care physician will handle any further medical issues. Please  note that NO REFILLS for any discharge medications will be authorized once you are discharged, as it is imperative that you return to your primary care physician (or establish a relationship with a primary care physician if you do not have one) for your aftercare needs so that they can reassess your need for medications and monitor your lab values.     Increase activity slowly    Complete by:  As directed             Medication List    TAKE these medications        capecitabine 500 MG tablet  Commonly known as:  XELODA  Take 1,500 mg by mouth See admin instructions. Take 3 tab (1500 mg) in AM and 3 tab (1500 mg) in PM (12 hours apart) by mouth with water AFTER meal days 1-14. OFF days 15-21     Cetirizine HCl 10 MG Caps  Take 10 mg by mouth daily.     guaifenesin 100 MG/5ML syrup  Commonly known as:  ROBITUSSIN  Take 10 mLs (200 mg total) by mouth every 6 (six) hours.     lactose free nutrition Liqd  Take 237 mLs by mouth 3 (three) times daily with meals.     metoprolol tartrate 25 MG tablet  Commonly known as:  LOPRESSOR  Take 0.5 tablets (12.5 mg total) by mouth 2 (two) times daily.     ondansetron 8 MG tablet  Commonly known as:  ZOFRAN  Take 1 tablet (8 mg total) by mouth every 8 (eight) hours as needed for nausea or vomiting.     prochlorperazine 10 MG tablet  Commonly known as:  COMPAZINE  Take 1 tablet (10 mg total) by mouth every 6 (six) hours as needed for nausea or vomiting.     RESOURCE THICKENUP CLEAR Powd  To make liquids honey thick     rosuvastatin 10 MG tablet  Commonly known as:  CRESTOR  Take 10 mg by mouth daily.     senna-docusate 8.6-50 MG per tablet  Commonly known as:  Senokot-S  Take 1 tablet by mouth daily as needed.          Diet and Activity recommendation: See Discharge Instructions above   Consults  obtained -  pulmonary, speech   Major procedures and Radiology Reports - PLEASE review detailed and final reports for all details,  in brief -     Intubated for 2 days on 02/18/2015   Ct Abdomen Pelvis Wo Contrast  02/18/2015   CLINICAL DATA:  Intractable vomiting, known history of colon carcinoma with metastatic disease.  EXAM: CT ABDOMEN AND PELVIS WITHOUT CONTRAST  TECHNIQUE: Multidetector CT imaging of the abdomen and pelvis was performed following the standard protocol without IV contrast.  COMPARISON:  02/18/2015.  FINDINGS:  \: FINDINGS:  \ Lung bases demonstrate patchy infiltrates similar to that seen on CT from earlier in the day. A nasogastric catheter is noted within the stomach. A considerable amount of contrast is noted within the stomach as well as some within the esophagus likely related to reflux.  The liver demonstrates some area of decreased attenuation in the right lobe best seen on image number 24 of series 2 consistent with the patient's given clinical history of metastatic colon carcinoma. The spleen, pancreas, gallbladder and adrenal glands are within normal limits. The kidneys are well visualized bilaterally. No renal calculi or obstructive changes are seen. A cyst is noted within the right kidney in the upper pole measuring approximately 1 cm.  Some edematous changes are noted within the mesenteric. Multiple dilated loops of small bowel are identified which continue to the level of the small bowel to colon anastomosis in the right upper quadrant the anastomosis appears patent it and there is a significant amount of fluid within the colon. These changes may represent a partial small bowel obstruction or possible ileus. Correlation with the physical exam is recommended. Continued followup plain films are recommended as well.  The more distal colon is within normal limits but fluid filled. The bladder is decompressed by a Foley catheter. No significant lymphadenopathy is seen. No acute bony abnormality is noted.  IMPRESSION: Postsurgical changes in the right upper quadrant consistent with the small bowel to  transverse colon anastomosis. The anastomosis appears patent although some mild inflammatory changes are noted within the mesenteric and small bowel dilatation is seen. It is uncertain whether this represents a partial small bowel obstruction or ileus. Followup plain films are recommended to assess for the passage of contrast material from the CT examination. Correlation with the physical exam would also be helpful.  Changes consistent with hepatic metastatic disease.   Electronically Signed   By: Inez Catalina M.D.   On: 02/18/2015 16:50   Ct Angio Chest Pe W/cm &/or Wo Cm  02/18/2015   CLINICAL DATA:  Dyspnea  EXAM: CT ANGIOGRAPHY CHEST WITH CONTRAST  TECHNIQUE: Multidetector CT imaging of the chest was performed using the standard protocol during bolus administration of intravenous contrast. Multiplanar CT image reconstructions and MIPs were obtained to evaluate the vascular anatomy.  CONTRAST:  71mL OMNIPAQUE IOHEXOL 350 MG/ML SOLN  COMPARISON:  Radiographs 02/17/2015  FINDINGS: Cardiovascular: There is good opacification of the pulmonary arteries. There is no pulmonary embolism. The thoracic aorta is normal in caliber and intact.  Lungs: There are multifocal airspace opacities, involving the right upper lobe to the greatest degree but also both lower lobes and minimal involvement in the right middle lobe. These opacities may represent multifocal pneumonia. Pulmonary hemorrhage could also have this appearance.  Central airways: Patent  Effusions: None  Lymphadenopathy: None  Esophagus: Nasogastric tube extends to the stomach. Mild uniform mural thickening is suggested throughout the esophagus.  Upper abdomen: 1.5 cm hypodense lesion at  the upper pole of the right kidney corresponding to 1 of the renal cysts observed on recent sonography. Low-attenuation lesions in the liver, indeterminate and incompletely imaged.  Musculoskeletal: No significant abnormalities  Review of the MIP images confirms the above  findings.  IMPRESSION: 1. Negative for pulmonary embolism 2. Multifocal airspace opacities in both lungs, possibly due to infectious infiltrates. Pulmonary hemorrhages could also have this appearance. 3. Low-attenuation liver lesions, not simple cysts. These could represent metastases.   Electronically Signed   By: Andreas Newport M.D.   On: 02/18/2015 03:45   US Abdomen Complete  02/16/2015   CLINICAL DATA:  Abdominal pain, colon carcinoma, nausea and vomiting  EXAM: COMPLETE ABDOMINAL ULTRASOUND  COMPARISON:  None available  FINDINGS: Gallbladder: Physiologically distended without stones, wall thickening, or pericholecystic fluid. Non shadowing mobile tumefactive sludge. Sonographer reports no sonographic Murphy's sign.  Common bile duct:  Normal in caliber, 4.67mm diameter.  Liver: There are 2 echogenic lesions in the right lobe probably in segment 5, 28 x 14 x 18 mm and 16 x 13 x 16 mm. There is no intrahepatic biliary ductal dilatation. Background parenchyma is unremarkable.  IVC:  Negative  Pancreas: Visualized segments unremarkable, portions obscured by overlying bowel gas.  Spleen:  No focal lesion, craniocaudal 7.5cm in length.  Right Kidney: 18 x 16 x 15 mm cyst in the upper pole. No hydronephrosis. 9.7Cm in length.  Left Kidney:  No lesion or hydronephrosis, 11.2cm in length.  Abdominal aorta:  Negative  IMPRESSION: 1. Two focal liver lesions suggesting metastatic disease given the clinical history. 2. Mobile tumefactive sludge in the gallbladder without ultrasound evidence of cholecystitis. 3. Right renal cyst   Electronically Signed   By: Lucrezia Europe M.D.   On: 02/16/2015 12:03   Dg Chest Port 1 View  02/22/2015   CLINICAL DATA:  Respiratory failure  EXAM: PORTABLE CHEST - 1 VIEW  COMPARISON:  02/20/2015  FINDINGS: The nasogastric tube is been removed. The left jugular central line extends into the SVC. The right jugular Port-A-Cath extends into the cavoatrial junction. There is improvement, with  slightly better aeration in the bases and improved lung volumes.  IMPRESSION: Slight improvement.   Electronically Signed   By: Andreas Newport M.D.   On: 02/22/2015 06:38   Dg Chest Port 1 View  02/20/2015   CLINICAL DATA:  Oxygen desaturation  EXAM: PORTABLE CHEST - 1 VIEW  COMPARISON:  02/18/2015  FINDINGS: There is a nasogastric tube extending into the stomach. There is a left jugular central line extending into the low SVC. There is a right sided Port-A-Cath extending into the right atrium. There is slight improvement, with less dense consolidation in the right upper lobe. Basilar opacities persist bilaterally. No large effusions are evident.  IMPRESSION: Slightly improved, with a lesser degree of consolidation in the right upper lobe. Support devices as detailed above.   Electronically Signed   By: Andreas Newport M.D.   On: 02/20/2015 03:25   Dg Chest Port 1 View  02/18/2015   CLINICAL DATA:  Vomiting and acute respiratory failure.  EXAM: PORTABLE CHEST - 1 VIEW  COMPARISON:  CTA chest 02/18/2015.  Chest radiograph 02/17/2015.  FINDINGS: Right-sided Port-A-Cath remains in place with tip overlying the high right atrium. A left jugular central venous catheter has been placed with tip overlying the lower SVC. An endotracheal tube has been placed with tip approximately 4.2 cm above the carina. New enteric tube terminates in the left upper quadrant with tip  over the expected region of the gastric fundus and side hole near the GE junction.  Confluent airspace opacity is present in the right mid lung, new from the prior radiograph but corresponding to the opacity described on more recent CT. Patchy opacities are also present in both lung bases corresponding to those seen on CT. No sizable pleural effusion or pneumothorax is identified.  IMPRESSION: 1. Interval placement support devices as above. 2. Bilateral lung infiltrates, greatest in the right midlung and corresponding to those described on recent CT.    Electronically Signed   By: Logan Bores   On: 02/18/2015 10:17   Dg Chest Port 1 View  02/17/2015   CLINICAL DATA:  Severe dyspnea and wheezing  EXAM: PORTABLE CHEST - 1 VIEW  COMPARISON:  02/16/2015  FINDINGS: There is a right sided Port-A-Cath with tip in the region of the cavoatrial junction. The nasogastric tube extends below the diaphragm and off the inferior edge of the image. The lungs are clear. The pulmonary vasculature is normal. There are no large effusions.  IMPRESSION: No active disease.   Electronically Signed   By: Andreas Newport M.D.   On: 02/17/2015 21:28   Dg Abd Acute W/chest  02/16/2015   CLINICAL DATA:  Dehydration, decreased appetite, nausea, vomiting and diarrhea for 4 days, cancer patient with port for chemotherapy  EXAM: ACUTE ABDOMEN SERIES (ABDOMEN 2 VIEW & CHEST 1 VIEW)  COMPARISON:  None  FINDINGS: RIGHT jugular Port-A-Cath tip projecting over SVC near cavoatrial junction.  Normal heart size, mediastinal contours and pulmonary vascularity.  LEFT hilar versus perihilar mass 3.0 x 2.7 cm.  Lungs otherwise clear.  No infiltrate, pleural effusion or pneumothorax.  Prior bowel resection RIGHT upper quadrant question RIGHT hemicolectomy.  Normal bowel gas pattern.  No bowel dilatation, bowel wall thickening, or free intraperitoneal air.  LEFT pelvic phleboliths.  No definite urinary tract calcification or acute osseous findings.  IMPRESSION: 3.0 x 2.7 cm LEFT hilar versus perihilar mass question neoplasm; recommend correlation with any prior outside imaging patient has.  If chest has not been imaged previously, recommend assessment by CT chest with IV contrast to exclude primary or metastatic neoplasm as well as hilar adenopathy.  Question prior RIGHT colon resection.  No acute abdominal findings.   Electronically Signed   By: Lavonia Dana M.D.   On: 02/16/2015 10:22   Dg Abd Portable 1v  02/24/2015   CLINICAL DATA:  NG tube placement  EXAM: PORTABLE ABDOMEN - 1 VIEW  COMPARISON:   02/23/2015  FINDINGS: Enteric tube terminates in the proximal duodenum.  Nonobstructive bowel gas pattern.  Visualized osseous structures are within normal limits.  IMPRESSION: Enteric tube terminates in the proximal duodenum.   Electronically Signed   By: Julian Hy M.D.   On: 02/24/2015 11:10   Dg Abd Portable 1v  02/23/2015   CLINICAL DATA:  Nasogastric tube placement.  EXAM: PORTABLE ABDOMEN - 1 VIEW  COMPARISON:  02/20/2015  FINDINGS: Nasogastric tube passes well below the diaphragm to have its tip in the distal stomach or possibly first portion of the duodenum.  There are few mildly prominent loops of small bowel most evident in the left quadrant with less dilation than noted on the CT from 02/18/2015. Bowel anastomosis staples are noted in the right quadrant, stable. Soft tissues are otherwise unremarkable. No gross free air on this supine study.  IMPRESSION: Nasogastric tube well positioned.   Electronically Signed   By: Lajean Manes M.D.   On:  02/23/2015 14:25   Dg Abd Portable 1v  02/20/2015   CLINICAL DATA:  Ileus  EXAM: PORTABLE ABDOMEN - 1 VIEW  COMPARISON:  CT 02/18/2015  FINDINGS: The nasogastric tube extends into the stomach. The abdominal gas pattern is negative for obstruction or perforation.  IMPRESSION: Nasogastric tube extends into the stomach. Unremarkable gas pattern.   Electronically Signed   By: Andreas Newport M.D.   On: 02/20/2015 05:12   Dg Abd Portable 1v  02/18/2015   CLINICAL DATA:  Ileus and vomiting.  EXAM: PORTABLE ABDOMEN - 1 VIEW  COMPARISON:  02/17/2015  FINDINGS: Persistent predominantly small bowel distention, but less prominent than previous. There is transverse colonic distention which is also mildly improved. Bowel sutures noted in the right flank in this patient with history of colon cancer, suspect right hemicolectomy.  Contrast in the urinary bladder post recent chest CT.  No concerning intra-abdominal mass effect or calcification.  IMPRESSION: Bowel  distension is modestly improved from yesterday, pattern favoring ileus.   Electronically Signed   By: Monte Fantasia M.D.   On: 02/18/2015 10:10   Dg Abd Portable 2v  02/17/2015   CLINICAL DATA:  Vomiting, orogastric tube  EXAM: PORTABLE ABDOMEN - 2 VIEW  COMPARISON:  Portable exam 1238 hr compared to 02/16/2015  FINDINGS: Small amount colonic gas.  Increased small bowel gas gaseous distention since previous exam.  No definite bowel wall thickening or free intraperitoneal air.  Tip of orogastric tube at proximal stomach.  Bones unremarkable.  Anastomotic staple line in RIGHT upper quadrant question prior RIGHT hemicolectomy.  No acute osseous findings.  IMPRESSION: Increase in small bowel distention since the previous exam, could represent ileus or developing small bowel obstruction.  Consider followup CT imaging with IV and oral contrast to assess if clinically indicated.   Electronically Signed   By: Lavonia Dana M.D.   On: 02/17/2015 13:20   Dg Swallowing Func-speech Pathology  02/26/2015    Objective Swallowing Evaluation:   Completed by Rodena Goldmann,  supervised by [name], [place] CCC-SLP  Patient Details  Name: Benjamin Blake MRN: [id number] Date of Birth: Apr 22, 1951  Today's Date: 02/26/2015 Time: SLP Start Time (ACUTE ONLY): 1535-SLP Stop Time (ACUTE ONLY): 1630 SLP Time Calculation (min) (ACUTE ONLY): 55 min  Past Medical History:  Past Medical History  Diagnosis Date  . Cancer    Past Surgical History:  Past Surgical History  Procedure Laterality Date  . Colon surgery     HPI:  HPI: Pt is a 64 y.o. male, recently moved from [place]. History  metastatic colon cancer admitted for intractable vomiting and diarrhea.  Suspicion for aspiration of emesis. Intubated 3/13-3/15. CXR slightly  improved, with a lesser degree of consolidation in the right upper lobe.  RN noted difficulty swallowing and initiated order for swallow assessment.  FEES completed 02/22/15. SLP in today to assess readiness  for po/repeat  objective study  No Data Recorded  Assessment / Plan / Recommendation CHL IP CLINICAL IMPRESSIONS 02/26/2015  Dysphagia Diagnosis Mild oral phase dysphagia;Moderate pharyngeal phase  dysphagia  Clinical impression Pt presents with severe pharyngeal phase dysphagia  characterized by reduced base of tongue retraction, reduced epiglottic  inversion, delayed swallow initiation to the pyriform with thin liquids  and to the valleculae across all other consistencies. Pt silently  aspirated with thin and nectar thick liquids during and after the swallow.  Pt able to expel aspirate when given cue to cough with nectar; unable to  expel  aspirate with thin. Chin tuck and supra glottic swallow innefective.  Pt able to improved function when given honey thick liquids and asked to  turn head to right side during swallow with tube in place. When NG removed  pt reassesed with honey-thick liquids with head turn; no aspiration noted;  reduced vallecular residue; unsure why strategy improved function, suspect  might have improved airway closure and pharyngeal contraction. Pt still at  high risk for aspiration given delayed swallow initiation, reduced airway  protection due to reduced epiglottic inversion and reduced base of tongue  retraction.  Recommend pt initiate dys 1/ honey thick liquid diet and a  head turn to right side during swallow; no straws; slow rate; meds crushed  in applesauce. Speech will follow-up for diet tolerance, possible upgrade  and pharyngeal strengthening exercises.       CHL IP TREATMENT RECOMMENDATION 02/26/2015  Treatment Plan Recommendations Therapy as outlined in treatment plan below      CHL IP DIET RECOMMENDATION 02/26/2015  Diet Recommendations Dysphagia 1 (Puree);Honey-thick liquid  Liquid Administration via Cup;No straw  Medication Administration Crushed with puree  Compensations Slow rate;Small sips/bites  Postural Changes and/or Swallow Maneuvers Seated upright 90 degrees     CHL IP  OTHER RECOMMENDATIONS 02/26/2015  Recommended Consults (None)  Oral Care Recommendations Oral care Q4 per protocol  Other Recommendations Order thickener from pharmacy     CHL IP FOLLOW UP RECOMMENDATIONS 02/25/2015  Follow up Recommendations Inpatient Rehab     CHL IP FREQUENCY AND DURATION 02/26/2015  Speech Therapy Frequency (ACUTE ONLY) min 2x/week  Treatment Duration 2 weeks     Pertinent Vitals/Pain NA    SLP Swallow Goals No flowsheet data found.  No flowsheet data found.    CHL IP REASON FOR REFERRAL 02/26/2015  Reason for Referral Objectively evaluate swallowing function     CHL IP ORAL PHASE 02/26/2015  Lips (None)  Tongue (None)  Mucous membranes (None)  Nutritional status (None)  Other (None)  Oxygen therapy (None)  Oral Phase WFL  Oral - Pudding Teaspoon (None)  Oral - Pudding Cup (None)  Oral - Honey Teaspoon (None)  Oral - Honey Cup (None)  Oral - Honey Syringe (None)  Oral - Nectar Teaspoon (None)  Oral - Nectar Cup (None)  Oral - Nectar Straw (None)  Oral - Nectar Syringe (None)  Oral - Ice Chips (None)  Oral - Thin Teaspoon (None)  Oral - Thin Cup (None)  Oral - Thin Straw (None)  Oral - Thin Syringe (None)  Oral - Puree (None)  Oral - Mechanical Soft (None)  Oral - Regular (None)  Oral - Multi-consistency (None)  Oral - Pill (None)  Oral Phase - Comment (None)      CHL IP PHARYNGEAL PHASE 02/26/2015  Pharyngeal Phase Impaired  Pharyngeal - Pudding Teaspoon (None)  Penetration/Aspiration details (pudding teaspoon) (None)  Pharyngeal - Pudding Cup (None)  Penetration/Aspiration details (pudding cup) (None)  Pharyngeal - Honey Teaspoon (None)  Penetration/Aspiration details (honey teaspoon) (None)  Pharyngeal - Honey Cup Delayed swallow initiation;Premature spillage to  valleculae;Pharyngeal residue - valleculae;Pharyngeal residue - pyriform  sinuses  Penetration/Aspiration details (honey cup) (None)  Pharyngeal - Honey Syringe (None)  Penetration/Aspiration details (honey syringe) (None)  Pharyngeal -  Nectar Teaspoon Delayed swallow initiation;Premature spillage  to valleculae;Premature spillage to pyriform sinuses;Reduced tongue base  retraction;Penetration/Aspiration during swallow  Penetration/Aspiration details (nectar teaspoon) Material enters airway,  passes BELOW cords and not ejected out despite cough attempt by  patient;Material does not enter  airway  Pharyngeal - Nectar Cup Delayed swallow initiation;Premature spillage to  valleculae;Penetration/Aspiration during swallow  Penetration/Aspiration details (nectar cup) Material enters airway, passes  BELOW cords and not ejected out despite cough attempt by patient;Material  does not enter airway  Pharyngeal - Nectar Straw (None)  Penetration/Aspiration details (nectar straw) (None)  Pharyngeal - Nectar Syringe (None)  Penetration/Aspiration details (nectar syringe) (None)  Pharyngeal - Ice Chips NT  Penetration/Aspiration details (ice chips) (None)  Pharyngeal - Thin Teaspoon (None)  Penetration/Aspiration details (thin teaspoon) (None)  Pharyngeal - Thin Cup Delayed swallow initiation;Premature spillage to  pyriform sinuses;Premature spillage to valleculae;Penetration/Aspiration  during swallow  Penetration/Aspiration details (thin cup) Material enters airway, passes  BELOW cords and not ejected out despite cough attempt by patient  Pharyngeal - Thin Straw (None)  Penetration/Aspiration details (thin straw) (None)  Pharyngeal - Thin Syringe (None)  Penetration/Aspiration details (thin syringe') (None)  Pharyngeal - Puree Delayed swallow initiation;Premature spillage to  valleculae  Penetration/Aspiration details (puree) (None)  Pharyngeal - Mechanical Soft (None)  Penetration/Aspiration details (mechanical soft) (None)  Pharyngeal - Regular Delayed swallow initiation  Penetration/Aspiration details (regular) (None)  Pharyngeal - Multi-consistency (None)  Penetration/Aspiration details (multi-consistency) (None)  Pharyngeal - Pill (None)   Penetration/Aspiration details (pill) (None)  Pharyngeal Comment (None)     CHL IP CERVICAL ESOPHAGEAL PHASE 02/26/2015  Cervical Esophageal Phase WFL  Pudding Teaspoon (None)  Pudding Cup (None) Honey Teaspoon (None)  Honey Cup (None)  Honey Syringe (None)  Nectar Teaspoon (None)  Nectar Cup (None)  Nectar Straw (None)  Nectar Syringe (None)  Thin Teaspoon (None)  Thin Cup (None)  Thin Straw (None)  Thin Syringe (None)  Cervical Esophageal Comment (None)    No flowsheet data found.  Rodena Goldmann, SLP Student  Supervised and reviewed by [name] MA CCC-SLP        DeBlois, Katherene Ponto 02/26/2015, 12:05 PM     Micro Results      Recent Results (from the past 240 hour(s))  MRSA PCR Screening     Status: None   Collection Time: 02/18/15  1:30 AM  Result Value Ref Range Status   MRSA by PCR NEGATIVE NEGATIVE Final    Comment:        The GeneXpert MRSA Assay (FDA approved for NASAL specimens only), is one component of a comprehensive MRSA colonization surveillance program. It is not intended to diagnose MRSA infection nor to guide or monitor treatment for MRSA infections.   Urine culture     Status: None   Collection Time: 02/18/15  1:13 PM  Result Value Ref Range Status   Specimen Description URINE, RANDOM  Final   Special Requests Immunocompromised  Final   Colony Count NO GROWTH Performed at [facility]   Final   Culture NO GROWTH Performed at [facility]   Final   Report Status 02/19/2015 FINAL  Final  Culture, respiratory (NON-Expectorated)     Status: None   Collection Time: 02/18/15  1:20 PM  Result Value Ref Range Status   Specimen Description ENDOTRACHEAL  Final   Special Requests NONE  Final   Gram Stain   Final    ABUNDANT WBC PRESENT,BOTH PMN AND MONONUCLEAR RARE SQUAMOUS EPITHELIAL CELLS PRESENT FEW GRAM POSITIVE COCCI IN PAIRS RARE YEAST Performed at [facility]    Culture   Final    MODERATE ESCHERICHIA COLI MODERATE  CANDIDA ALBICANS Performed at [facility]    Report Status 02/21/2015 FINAL  Final  Organism ID, Bacteria ESCHERICHIA COLI  Final      Susceptibility   Escherichia coli - MIC*    AMPICILLIN >=32 RESISTANT Resistant     AMPICILLIN/SULBACTAM 8 SENSITIVE Sensitive     CEFAZOLIN <=4 SENSITIVE Sensitive     CEFEPIME <=1 SENSITIVE Sensitive     CEFTAZIDIME <=1 SENSITIVE Sensitive     CEFTRIAXONE <=1 SENSITIVE Sensitive     CIPROFLOXACIN <=0.25 SENSITIVE Sensitive     GENTAMICIN <=1 SENSITIVE Sensitive     IMIPENEM <=0.25 SENSITIVE Sensitive     PIP/TAZO <=4 SENSITIVE Sensitive     TOBRAMYCIN <=1 SENSITIVE Sensitive     TRIMETH/SULFA <=20 SENSITIVE Sensitive     * MODERATE ESCHERICHIA COLI  Culture, blood (x 2)     Status: None   Collection Time: 02/18/15  2:00 PM  Result Value Ref Range Status   Specimen Description BLOOD LEFT ARM  Final   Special Requests BOTTLES DRAWN AEROBIC AND ANAEROBIC 3CC  Final   Culture   Final    NO GROWTH 5 DAYS Performed at [facility]    Report Status 02/24/2015 FINAL  Final  Culture, blood (x 2)     Status: None   Collection Time: 02/18/15  2:07 PM  Result Value Ref Range Status   Specimen Description BLOOD LEFT HAND  Final   Special Requests BOTTLES DRAWN AEROBIC AND ANAEROBIC 4CC  Final   Culture   Final    NO GROWTH 5 DAYS Performed at [facility]    Report Status 02/24/2015 FINAL  Final  Clostridium Difficile by PCR     Status: None   Collection Time: 02/19/15 10:11 PM  Result Value Ref Range Status   C difficile by pcr NEGATIVE NEGATIVE Final       Today   Subjective:   Benjamin Blake today has no headache,no chest abdominal pain,no new weakness tingling or numbness, feels much better wants to go home today.    Objective:   Blood pressure 93/54, pulse 95, temperature 97.9 F (36.6 C), temperature source Oral, resp. rate 18, height 5\' 5"  (1.651 m), weight 74.4 kg (164 lb 0.4 oz), SpO2 96  %.   Intake/Output Summary (Last 24 hours) at 02/27/15 1028 Last data filed at 02/27/15 0500  Gross per 24 hour  Intake 1078.75 ml  Output      0 ml  Net 1078.75 ml    Exam Awake Alert, Oriented x 3, No new F.N deficits, Normal affect [place].AT,PERRAL Supple Neck,No JVD, No cervical lymphadenopathy appriciated.  Symmetrical Chest wall movement, Good air movement bilaterally, CTAB RRR,No Gallops,Rubs or new Murmurs, No Parasternal Heave +ve B.Sounds, Abd Soft, Non tender, No organomegaly appriciated, No rebound -guarding or rigidity. No Cyanosis, Clubbing or edema, No new Rash or bruise  Data Review   CBC w Diff:  Lab Results  Component Value Date   WBC 9.1 02/25/2015   HGB 10.1* 02/25/2015   HCT 29.9* 02/25/2015   PLT 179 02/25/2015   LYMPHOPCT 14 02/25/2015   BANDSPCT 0 02/20/2015   MONOPCT 7 02/25/2015   EOSPCT 3 02/25/2015   BASOPCT 0 02/25/2015    CMP:  Lab Results  Component Value Date   NA 139 02/25/2015   K 3.5 02/25/2015   CL 110 02/25/2015   CO2 22 02/25/2015   BUN 19 02/25/2015   CREATININE 0.79 02/25/2015   PROT 4.9* 02/25/2015   ALBUMIN 1.4* 02/25/2015   BILITOT 1.1 02/25/2015   ALKPHOS 94 02/25/2015   AST  89* 02/25/2015   ALT 65* 02/25/2015  .   Total Time in preparing paper work, data evaluation and todays exam - 35 minutes  Thurnell Lose M.D on 02/27/2015 at 10:28 AM  [facility]   Office  [phone]

## 2015-02-27 NOTE — Discharge Instructions (Signed)
Follow with Primary MD  in 7 days   Get CBC, CMP, 2 view Chest X ray checked  by Primary MD next visit.    Activity: As tolerated with Full fall precautions use walker/cane & assistance as needed   Disposition Home    Diet: Aphasia 1 diet with honey thick liquids with feeding assistance and aspiration precautions .  For Heart failure patients - Check your Weight same time everyday, if you gain over 2 pounds, or you develop in leg swelling, experience more shortness of breath or chest pain, call your Primary MD immediately. Follow Cardiac Low Salt Diet and 1.5 lit/day fluid restriction.   On your next visit with your primary care physician please Get Medicines reviewed and adjusted.   Please request your Prim.MD to go over all Hospital Tests and Procedure/Radiological results at the follow up, please get all Hospital records sent to your Prim MD by signing hospital release before you go home.   If you experience worsening of your admission symptoms, develop shortness of breath, life threatening emergency, suicidal or homicidal thoughts you must seek medical attention immediately by calling 911 or calling your MD immediately  if symptoms less severe.  You Must read complete instructions/literature along with all the possible adverse reactions/side effects for all the Medicines you take and that have been prescribed to you. Take any new Medicines after you have completely understood and accpet all the possible adverse reactions/side effects.   Do not drive, operating heavy machinery, perform activities at heights, swimming or participation in water activities or provide baby sitting services if your were admitted for syncope or siezures until you have seen by Primary MD or a Neurologist and advised to do so again.  Do not drive when taking Pain medications.    Do not take more than prescribed Pain, Sleep and Anxiety Medications  Special Instructions: If you have smoked or chewed Tobacco   in the last 2 yrs please stop smoking, stop any regular Alcohol  and or any Recreational drug use.  Wear Seat belts while driving.   Please note  You were cared for by a hospitalist during your hospital stay. If you have any questions about your discharge medications or the care you received while you were in the hospital after you are discharged, you can call the unit and asked to speak with the hospitalist on call if the hospitalist that took care of you is not available. Once you are discharged, your primary care physician will handle any further medical issues. Please note that NO REFILLS for any discharge medications will be authorized once you are discharged, as it is imperative that you return to your primary care physician (or establish a relationship with a primary care physician if you do not have one) for your aftercare needs so that they can reassess your need for medications and monitor your lab values.

## 2015-02-27 NOTE — Progress Notes (Signed)
Patient discharged in wheelchair with wife and son at bedside.  Patient verbalizes feeling a lot better- vital signs stable. No complaints at this time and very happy with care.  All belongings packed and with family.  Discharge instructions given and education on care at home and medications.  Patient will follow up with primary care in 1 week.

## 2015-03-01 ENCOUNTER — Telehealth: Payer: Self-pay

## 2015-03-01 ENCOUNTER — Ambulatory Visit: Payer: BLUE CROSS/BLUE SHIELD | Attending: Physician Assistant | Admitting: Physician Assistant

## 2015-03-01 VITALS — BP 105/76 | HR 114 | Temp 98.3°F | Resp 18 | Ht 68.0 in | Wt 146.0 lb

## 2015-03-01 DIAGNOSIS — Z85038 Personal history of other malignant neoplasm of large intestine: Secondary | ICD-10-CM | POA: Insufficient documentation

## 2015-03-01 DIAGNOSIS — R000 Tachycardia, unspecified: Secondary | ICD-10-CM | POA: Insufficient documentation

## 2015-03-01 DIAGNOSIS — C189 Malignant neoplasm of colon, unspecified: Secondary | ICD-10-CM | POA: Diagnosis not present

## 2015-03-01 DIAGNOSIS — I480 Paroxysmal atrial fibrillation: Secondary | ICD-10-CM | POA: Insufficient documentation

## 2015-03-01 DIAGNOSIS — R0602 Shortness of breath: Secondary | ICD-10-CM | POA: Diagnosis present

## 2015-03-01 NOTE — Telephone Encounter (Signed)
Bryson Ha from [facility] calling for f/u orders for patient, please f/u with pt.

## 2015-03-01 NOTE — Patient Instructions (Signed)
Stay the course! You look great.Your heart rate will continue to improve as the other more chronic issues improve. Keep taking all the meds as prescribed. Keep all appointment. You need a CBC, CMP and 2 view chest xray within a week with your PCP or Oncologist.

## 2015-03-01 NOTE — Progress Notes (Signed)
Patient here for hospital follow-up acute respiratory hypoxic respiratory failure, sinus tachycardia. Patient complains of shortness of breath, poor appetite, heartburn On honey thickened diet.  [place] SLP to start today. HR elevated 114-EKG on 02/19/15 for new sinus tachycardia.

## 2015-03-01 NOTE — Progress Notes (Signed)
Benjamin Blake  [id number]  [id number]:[id number]  DOB - May 30, 1951  Chief Complaint  Patient presents with  . Hospitalization Follow-up  . Shortness of Breath       Subjective:   Benjamin Blake is a 64 y.o. male here today for establishment of care. He was hospitalized from 02/16/15-02/27/15. He initially presented on 02/16/2015 with complaints of intractable nausea and vomiting for 2 days. He has a history of metastatic colon cancer initially diagnosed in 2010. He underwent surgery at that time. He had a recurrence in 2015 with metastasis. He is undergoing chemotherapy at [facility] and recently moved to this area March 1st. He does not have a primary care provider. His hospital course was complicated by acute kidney injury with a creatinine of 1.6. Aspiration pneumonia from E coli. Vent dependent respiratory failure for 24 hours. Dysphagia and paroxysmal atrial fibrillation consult by cardiology. His ejection fraction was 45-50%. He was sent home on beta blockers. Since discharge he is still having issues with swallowing. Home health speech therapy will be seeing him today for further recommendations. He denies chest pain. His breathing has slowly improved. He is a little congested because he's been off of his antihistamine for 2 weeks. He denies nausea or vomiting.    ROS: GEN: denies fever or chills, denies change in weight Skin: denies lesions or rashes HEENT: denies headache, earache, epistaxis, sore throat, or neck pain LUNGS: minimal SHOB, dyspnea, PND, orthopnea CV: denies CP or palpitations ABD: denies abd pain, N or V EXT: denies muscle spasms or swelling; no pain in lower ext, no weakness NEURO: denies numbness or tingling, denies sz, stroke or TIA   ALLERGIES: No Known Allergies  PAST MEDICAL HISTORY: Past Medical History  Diagnosis Date  . Cancer     PAST SURGICAL HISTORY: Past Surgical History  Procedure Laterality Date  . Colon surgery      MEDICATIONS AT HOME: Prior  to Admission medications   Medication Sig Start Date End Date Taking? Authorizing Provider  Cetirizine HCl 10 MG CAPS Take 10 mg by mouth daily.   Yes Historical Provider, MD  guaifenesin (ROBITUSSIN) 100 MG/5ML syrup Take 10 mLs (200 mg total) by mouth every 6 (six) hours. 02/27/15  Yes Thurnell Lose, MD  lactose free nutrition (BOOST PLUS) LIQD Take 237 mLs by mouth 3 (three) times daily with meals. 02/27/15  Yes Thurnell Lose, MD  Maltodextrin-Xanthan Gum ([place]) POWD To make liquids honey thick 02/27/15  Yes Thurnell Lose, MD  metoprolol tartrate (LOPRESSOR) 25 MG tablet Take 0.5 tablets (12.5 mg total) by mouth 2 (two) times daily. 02/27/15 02/27/16 Yes Thurnell Lose, MD  rosuvastatin (CRESTOR) 10 MG tablet Take 10 mg by mouth daily. 09/08/14  Yes Historical Provider, MD  senna-docusate (SENOKOT-S) 8.6-50 MG per tablet Take 1 tablet by mouth daily as needed. 02/27/15 02/27/16 Yes Thurnell Lose, MD  capecitabine (XELODA) 500 MG tablet Take 1,500 mg by mouth See admin instructions. Take 3 tab (1500 mg) in AM and 3 tab (1500 mg) in PM (12 hours apart) by mouth with water AFTER meal days 1-14. OFF days 15-21 12/26/14   Historical Provider, MD  ondansetron (ZOFRAN) 8 MG tablet Take 1 tablet (8 mg total) by mouth every 8 (eight) hours as needed for nausea or vomiting. Patient not taking: Reported on 03/01/2015 02/27/15   Thurnell Lose, MD  prochlorperazine (COMPAZINE) 10 MG tablet Take 1 tablet (10 mg total) by mouth every 6 (six) hours as  needed for nausea or vomiting. Patient not taking: Reported on 03/01/2015 02/27/15   Thurnell Lose, MD     Objective:   Filed Vitals:   03/01/15 0917  BP: 105/76  Pulse: 114  Temp: 98.3 F (36.8 C)  TempSrc: Oral  Resp: 18  Height: 5\' 8"  (1.727 m)  Weight: 146 lb (66.225 kg)  SpO2: 96%   Exam General appearance : Awake, alert, not in any distress. Speech Clear. Not toxic looking HEENT: Atraumatic and Normocephalic, pupils  equally reactive to light and accomodation Neck: supple, no JVD. No cervical lymphadenopathy.  Chest:Good air entry bilaterally, no added sounds  CVS: S1 S2 regular but fast, no murmurs.  Abdomen: Bowel sounds present, Non tender and not distended with no gaurding, rigidity or rebound. Extremities: B/L Lower Ext shows no edema, both legs are warm to touch Neurology: Awake alert, and oriented X 3, CN II-XII intact, Non focal Skin:No Rash Wounds:[name]  Data Review No results found for: HGBA1C   Assessment & Plan  1.  Metastatic colon cancer  -keep appt with primary oncologist at [facility] for next week   - thickened diet; assistance from home health speech therapy   - continue prn antiemetics 2. Paroxysmal atrial fibrillation > Sinus tach  -CHADS VASC scoere <2. Aspirin only.   -Cont beta blocker  -Two-week follow-up with cardiology 3. EF 45-50%  - stable. Two-week follow-up with cardiology  4. Aspiration pneumonia   -resolved. Outpatient speech therapy following.   He will be establishing care with his primary care provider at [facility] on 03/04/2015 at 10:30 AM. It is recommended that he has a CBC, CMP, and x-ray at that time.    The patient was given clear instructions to go to ER or return to medical center if symptoms don't improve, worsen or new problems develop. The patient verbalized understanding. The patient was told to call to get lab results if they haven't heard anything in the next week.   This note has been created with Surveyor, quantity. Any transcriptional errors are unintentional.    Zettie Pho, PA-C [facility] [place], [place]   03/01/2015, 9:36 AM

## 2015-03-05 ENCOUNTER — Other Ambulatory Visit: Payer: Self-pay | Admitting: Family Medicine

## 2015-03-05 ENCOUNTER — Ambulatory Visit
Admission: RE | Admit: 2015-03-05 | Discharge: 2015-03-05 | Disposition: A | Discharge status: Home / Self Care | Payer: BLUE CROSS/BLUE SHIELD | Source: Ambulatory Visit | Attending: Family Medicine | Admitting: Family Medicine

## 2015-03-05 ENCOUNTER — Other Ambulatory Visit (HOSPITAL_COMMUNITY): Payer: Self-pay | Admitting: Family Medicine

## 2015-03-05 DIAGNOSIS — C189 Malignant neoplasm of colon, unspecified: Secondary | ICD-10-CM

## 2015-03-05 DIAGNOSIS — R1314 Dysphagia, pharyngoesophageal phase: Secondary | ICD-10-CM

## 2015-03-06 ENCOUNTER — Encounter: Payer: Self-pay | Admitting: Cardiovascular Disease

## 2015-03-06 ENCOUNTER — Ambulatory Visit (INDEPENDENT_AMBULATORY_CARE_PROVIDER_SITE_OTHER): Payer: BLUE CROSS/BLUE SHIELD | Admitting: Cardiovascular Disease

## 2015-03-06 VITALS — BP 96/72 | HR 94 | Resp 16 | Ht 68.0 in | Wt 145.0 lb

## 2015-03-06 DIAGNOSIS — I480 Paroxysmal atrial fibrillation: Secondary | ICD-10-CM | POA: Diagnosis not present

## 2015-03-06 DIAGNOSIS — R9431 Abnormal electrocardiogram [ECG] [EKG]: Secondary | ICD-10-CM

## 2015-03-06 DIAGNOSIS — I2510 Atherosclerotic heart disease of native coronary artery without angina pectoris: Secondary | ICD-10-CM

## 2015-03-06 DIAGNOSIS — I952 Hypotension due to drugs: Secondary | ICD-10-CM | POA: Diagnosis not present

## 2015-03-06 MED ORDER — ASPIRIN EC 81 MG PO TBEC: 81.0000 mg | DELAYED_RELEASE_TABLET | Freq: Every day | ORAL | Status: DC

## 2015-03-06 NOTE — Patient Instructions (Signed)
START Aspirin 81mg  daily.  Your physician has requested that you have a lexiscan myoview. For further information please visit [email or web address]. Please follow instruction sheet, as given.  Dr. Sallyanne Kuster recommends that you schedule a follow-up appointment in: 1st available.

## 2015-03-06 NOTE — Progress Notes (Signed)
Patient ID: Benjamin Blake, male   DOB: Apr 10, 1951, 64 y.o.   MRN: [id number]      Cardiology Office Note   Date:  03/08/2015   ID:  Benjamin Blake, DOB 1951-10-22, MRN [id number]  PCP:  Lujean Amel, MD  Cardiologist:   Sanda Klein, MD   Chief Complaint  Patient presents with  . Follow-up    hosp. admission on 3/12, eps of A-fib      History of Present Illness: Benjamin Blake is a 64 y.o. male who presents for follow up for recent onset atrial fibrillation during critical illness (aspiration pneumonia, respiratory failure, hypokalemia following intractable chemotherapy associated vomiting). 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  He has no prior history of arrhythmia or other cardiac illness, but his ECG shows inferior Q waves. The only other ecg report available is 02/03/2015 at [facility] (verbal only, no tracing) that showed "Q waves in II and aVF and flattened T waves V4-V6). Previous records from [place] are not yet available. Echo during recent admission shwed LVEF 45-50% without mention of regional wall motion abnormalities, but with "moderate LVH". My review of his chest CTA shows at least moderate calcifications in all three coronaries. He takes a statin for hypercholesterolemia  He has never had angina and denies dyspnea. He is weak and dizzy on a low beta blocker dose. At home, his BP was 70/50 today. It is still low in the office. He is having trouble staying hydrated since he dislikes the recommended thickened fluids.    Past Medical History  Diagnosis Date  . Cancer   . Coronary artery calcification seen on CAT scan 03/08/2015    Past Surgical History  Procedure Laterality Date  . Colon surgery       Current Outpatient Prescriptions  Medication Sig Dispense Refill  . Cetirizine HCl 10 MG CAPS Take 10 mg by mouth daily.    Marland Kitchen guaifenesin (ROBITUSSIN) 100 MG/5ML syrup Take 10 mLs (200 mg total) by mouth every 6 (six) hours. (Patient  taking differently: Take 200 mg by mouth every 6 (six) hours as needed. ) 120 mL 0  . lactose free nutrition (BOOST PLUS) LIQD Take 237 mLs by mouth 3 (three) times daily with meals. 90 Can 1  . Maltodextrin-Xanthan Gum (RESOURCE THICKENUP CLEAR) POWD To make liquids honey thick 125 g 2  . metoprolol tartrate (LOPRESSOR) 25 MG tablet Take 0.5 tablets (12.5 mg total) by mouth 2 (two) times daily. 60 tablet 0  . ondansetron (ZOFRAN) 8 MG tablet Take 1 tablet (8 mg total) by mouth every 8 (eight) hours as needed for nausea or vomiting. 30 tablet 0  . prochlorperazine (COMPAZINE) 10 MG tablet Take 1 tablet (10 mg total) by mouth every 6 (six) hours as needed for nausea or vomiting. 30 tablet 0  . rosuvastatin (CRESTOR) 10 MG tablet Take 10 mg by mouth daily.    Marland Kitchen senna-docusate (SENOKOT-S) 8.6-50 MG per tablet Take 1 tablet by mouth daily as needed. 30 tablet 0  . aspirin EC 81 MG tablet Take 1 tablet (81 mg total) by mouth daily. 90 tablet 3   No current facility-administered medications for this visit.    Allergies:   Review of patient's allergies indicates no known allergies.    Social History:  The patient  reports that he has never smoked. He does not have any smokeless tobacco history on file. He reports that he drinks alcohol. He reports that he does not use illicit drugs.  Family History:  The patient's family history includes Cancer - Colon in his mother; Heart disease in his father.    ROS:  Please see the history of present illness.    Otherwise, review of systems positive for poor appetite, substantial weight loss, no pain, no cough or fever, no choking.   All other systems are reviewed and negative.    PHYSICAL EXAM: VS:  BP 96/72 mmHg  Pulse 94  Resp 16  Ht 5\' 8"  (1.727 m)  Wt 145 lb (65.772 kg)  BMI 22.05 kg/m2 , BMI Body mass index is 22.05 kg/(m^2).  General: Alert, oriented x3, no distress Head: no evidence of trauma, PERRL, EOMI, no exophtalmos or lid lag, no  myxedema, no xanthelasma; normal ears, nose and oropharynx Neck: normal jugular venous pulsations and no hepatojugular reflux; brisk carotid pulses without delay and no carotid bruits Chest: clear to auscultation, no signs of consolidation by percussion or palpation, normal fremitus, symmetrical and full respiratory excursions Cardiovascular: normal position and quality of the apical impulse, regular rhythm, normal first and second heart sounds, no murmurs, rubs or gallops Abdomen: no tenderness or distention, no masses by palpation, no abnormal pulsatility or arterial bruits, normal bowel sounds, no hepatosplenomegaly Extremities: no clubbing, cyanosis or edema; 2+ radial, ulnar and brachial pulses bilaterally; 2+ right femoral, posterior tibial and dorsalis pedis pulses; 2+ left femoral, posterior tibial and dorsalis pedis pulses; no subclavian or femoral bruits Neurological: grossly nonfocal Psych: euthymic mood, full affect   EKG:  EKG is ordered today. The ekg ordered today demonstrates inferior Q waves, lateral T wave nonspecific changes, no ST segment changes   Recent Labs: 02/17/2015: B Natriuretic Peptide 59.4 02/18/2015: TSH 1.076 02/25/2015: ALT 65*; BUN 19; Creatinine 0.79; Hemoglobin 10.1*; Magnesium 1.8; Platelets 179; Potassium 3.5; Sodium 139    Lipid Panel    Component Value Date/Time   TRIG 149 02/25/2015 0602      Wt Readings from Last 3 Encounters:  03/06/15 145 lb (65.772 kg)  03/01/15 146 lb (66.225 kg)  02/18/15 164 lb 0.4 oz (74.4 kg)      Other studies Reviewed: Records from recent hospitalization, records from [facility], imaging studies and echo images  ASSESSMENT AND PLAN:  1. Hypotension - symptomatic, need to stop beta blocker and encourage fluids  2. atrial fibrillation - likely related to critical illness and may not recur. There is a possibility of pericardial neoplastic involvement. Anticoagulants more likely to hurt than help. No active  bleeding, start ASA 81 mg daily (see CAD considerations below).  3. Coronary calcifications -  Needs at least a functional evaluation  4. 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 In the next few days he has an appointment at [facility] to discuss other chemo options. Will plan a nuclear perfusion study (Lexiscan, too weak to walk on treadmill) while we get a better idea of his treatment plan and prognosis.   Current medicines are reviewed at length with the patient today.  The patient does not have concerns regarding medicines.  The following changes have been made:  Stop metoprolol  Labs/ tests ordered today include:  Orders Placed This Encounter  Procedures  . Myocardial Perfusion Imaging  . EKG 12-Lead    Patient Instructions  START Aspirin 81mg  daily.  Your physician has requested that you have a lexiscan myoview. For further information please visit [email or web address]. Please follow  instruction sheet, as given.  Dr. Sallyanne Kuster recommends that you schedule a follow-up appointment in: 1st available.      Mikael Spray, MD  03/08/2015 6:14 PM    Sanda Klein, MD, [facility] [phone] office [phone] pager

## 2015-03-07 ENCOUNTER — Telehealth: Payer: Self-pay | Admitting: Cardiovascular Disease

## 2015-03-07 NOTE — Telephone Encounter (Signed)
Received records from [facility] for appointment with Dr Sallyanne Kuster on 04/19/15.  Records given to [facility] (medical records) for Dr Croitoru's schedule on 04/19/15.  lp

## 2015-03-08 ENCOUNTER — Telehealth: Payer: Self-pay | Admitting: *Deleted

## 2015-03-08 ENCOUNTER — Ambulatory Visit (HOSPITAL_COMMUNITY)
Admission: RE | Admit: 2015-03-08 | Discharge: 2015-03-08 | Disposition: A | Discharge status: Home / Self Care | Payer: BLUE CROSS/BLUE SHIELD | Source: Ambulatory Visit | Attending: Family Medicine | Admitting: Family Medicine

## 2015-03-08 ENCOUNTER — Telehealth (HOSPITAL_COMMUNITY): Payer: Self-pay

## 2015-03-08 ENCOUNTER — Encounter: Payer: Self-pay | Admitting: Cardiovascular Disease

## 2015-03-08 DIAGNOSIS — R1314 Dysphagia, pharyngoesophageal phase: Secondary | ICD-10-CM | POA: Insufficient documentation

## 2015-03-08 DIAGNOSIS — R1310 Dysphagia, unspecified: Secondary | ICD-10-CM | POA: Diagnosis present

## 2015-03-08 DIAGNOSIS — I952 Hypotension due to drugs: Secondary | ICD-10-CM | POA: Insufficient documentation

## 2015-03-08 DIAGNOSIS — C189 Malignant neoplasm of colon, unspecified: Secondary | ICD-10-CM | POA: Insufficient documentation

## 2015-03-08 DIAGNOSIS — R9431 Abnormal electrocardiogram [ECG] [EKG]: Secondary | ICD-10-CM | POA: Insufficient documentation

## 2015-03-08 DIAGNOSIS — Z8701 Personal history of pneumonia (recurrent): Secondary | ICD-10-CM | POA: Diagnosis not present

## 2015-03-08 DIAGNOSIS — I2510 Atherosclerotic heart disease of native coronary artery without angina pectoris: Secondary | ICD-10-CM

## 2015-03-08 HISTORY — DX: Atherosclerotic heart disease of native coronary artery without angina pectoris: I25.10

## 2015-03-08 NOTE — Telephone Encounter (Signed)
Encounter complete. 

## 2015-03-08 NOTE — Telephone Encounter (Signed)
Signed referral to OP [facility] referral faxed.

## 2015-03-13 ENCOUNTER — Ambulatory Visit (HOSPITAL_COMMUNITY)
Admission: RE | Admit: 2015-03-13 | Discharge: 2015-03-13 | Disposition: A | Discharge status: Home / Self Care | Payer: BLUE CROSS/BLUE SHIELD | Source: Ambulatory Visit | Attending: Cardiovascular Disease | Admitting: Cardiovascular Disease

## 2015-03-13 DIAGNOSIS — R9431 Abnormal electrocardiogram [ECG] [EKG]: Secondary | ICD-10-CM

## 2015-03-13 MED ORDER — TECHNETIUM TC 99M SESTAMIBI GENERIC - CARDIOLITE
10.9000 | Freq: Once | INTRAVENOUS | Status: AC | PRN
  Administered 2015-03-13: 10.9 via INTRAVENOUS

## 2015-03-13 MED ORDER — REGADENOSON 0.4 MG/5ML IV SOLN
0.4000 mg | Freq: Once | INTRAVENOUS | Status: AC
  Administered 2015-03-13: 0.4 mg via INTRAVENOUS

## 2015-03-13 MED ORDER — TECHNETIUM TC 99M SESTAMIBI GENERIC - CARDIOLITE
32.0000 | Freq: Once | INTRAVENOUS | Status: AC | PRN
  Administered 2015-03-13: 32 via INTRAVENOUS

## 2015-03-13 NOTE — Procedures (Addendum)
[name] [address] [place] [address] [phone]  Cardiology Nuclear Med Crist Fat  Benjamin Blake is a 64 y.o. male     MRN : [id number]     DOB: 1951/09/07  Procedure Date: 03/13/2015  Nuclear Med Background Indication for Stress Test:  [facility] and Abnormal EKG History:  AFIB;Acute respiratory failure;Tachycardia;Dysphagia;Hypoxia Cardiac Risk Factors: Family History - CAD and Lipids  Symptoms:  Fatigue and Palpitations   Nuclear Pre-Procedure Caffeine/Decaff Intake:  12:00am NPO After: 8:00am   IV Site: R Hand  IV 0.9% NS with Angio Cath:  22g  Chest Size (in):  38" IV Started by: Rolene Course, RN  Height: 5\' 8"  (1.727 m)  Cup Size: n/a  BMI:  Body mass index is 22.05 kg/(m^2). Weight:  145 lb (65.772 kg)   Tech Comments:  n/a    Nuclear Med Study 1 or 2 day study: 1 day  Stress Test Type:  [place] Provider:  Sanda Klein, MD   Resting Radionuclide: Technetium 65m Sestamibi  Resting Radionuclide Dose: 10.9 mCi   Stress Radionuclide:  Technetium 16m Sestamibi  Stress Radionuclide Dose: 32.0 mCi           Stress Protocol Rest HR: 96 Stress HR: 107  Rest BP: 123/79 Stress BP: 107/80  Exercise Time (min): n/a METS: n/a          Dose of Adenosine (mg):  n/a Dose of Lexiscan: 0.4 mg  Dose of Atropine (mg): n/a Dose of Dobutamine: n/a mcg/kg/min (at max HR)  Stress Test Technologist: Leane Para, CCT Nuclear Technologist: Anthony Sar   Rest Procedure:  Myocardial perfusion imaging was performed at rest 45 minutes following the intravenous administration of Technetium 51m Sestamibi. Stress Procedure:  The patient received IV Lexiscan 0.4 mg over 15-seconds.  Technetium 18m Sestamibi injected IV at 30-seconds.  There were no significant changes with Lexiscan.  Quantitative spect images were obtained after a 45 minute delay.  Transient Ischemic Dilatation (Normal <1.22):   1.05  QGS EDV:  73 ml QGS ESV:  32 ml LV Ejection Fraction: 56%  Rest ECG: NSR - Normal EKG  Stress ECG: No significant change from baseline ECG  QPS Raw Data Images:  There is no interference from nuclear activity from structures below the diaphragm.   Stress Images:  Normal homogeneous uptake in all areas of the myocardium. Rest Images:  Normal homogeneous uptake in all areas of the myocardium. Subtraction (SDS):  No evidence of ischemia.  Impression Exercise Capacity:  Lexiscan with no exercise. BP Response:  Normal blood pressure response. Clinical Symptoms:  No symptoms. ECG Impression:  No significant ECG changes with Lexiscan. Comparison with Prior Nuclear Study: No previous nuclear study performed  Overall Impression:  Normal stress nuclear study.  LV Wall Motion:  NL LV Function; NL Wall Motion; EF 56%  Pixie Casino, MD, [facility] Board Certified in Nuclear Cardiology Attending Cardiologist Derby Line, MD  03/13/2015 5:47 PM

## 2015-03-14 ENCOUNTER — Telehealth: Payer: Self-pay | Admitting: Emergency Medicine

## 2015-03-14 NOTE — Telephone Encounter (Signed)
SIGN AND FAX BACK

## 2015-03-19 ENCOUNTER — Other Ambulatory Visit (HOSPITAL_COMMUNITY): Payer: Self-pay | Admitting: Family Medicine

## 2015-03-20 ENCOUNTER — Other Ambulatory Visit (HOSPITAL_COMMUNITY): Payer: Self-pay | Admitting: Family Medicine

## 2015-03-20 DIAGNOSIS — R1314 Dysphagia, pharyngoesophageal phase: Secondary | ICD-10-CM

## 2015-03-20 DIAGNOSIS — J690 Pneumonitis due to inhalation of food and vomit: Secondary | ICD-10-CM

## 2015-03-27 ENCOUNTER — Other Ambulatory Visit (HOSPITAL_COMMUNITY): Payer: Self-pay

## 2015-03-27 ENCOUNTER — Ambulatory Visit (HOSPITAL_COMMUNITY): Payer: BLUE CROSS/BLUE SHIELD

## 2015-04-01 ENCOUNTER — Ambulatory Visit (HOSPITAL_COMMUNITY)
Admission: RE | Admit: 2015-04-01 | Discharge: 2015-04-01 | Disposition: A | Discharge status: Home / Self Care | Payer: BLUE CROSS/BLUE SHIELD | Source: Ambulatory Visit | Attending: Family Medicine | Admitting: Family Medicine

## 2015-04-01 DIAGNOSIS — I2510 Atherosclerotic heart disease of native coronary artery without angina pectoris: Secondary | ICD-10-CM | POA: Insufficient documentation

## 2015-04-01 DIAGNOSIS — R1310 Dysphagia, unspecified: Secondary | ICD-10-CM | POA: Insufficient documentation

## 2015-04-01 DIAGNOSIS — C189 Malignant neoplasm of colon, unspecified: Secondary | ICD-10-CM | POA: Diagnosis not present

## 2015-04-01 DIAGNOSIS — R1314 Dysphagia, pharyngoesophageal phase: Secondary | ICD-10-CM

## 2015-04-01 DIAGNOSIS — R490 Dysphonia: Secondary | ICD-10-CM | POA: Insufficient documentation

## 2015-04-01 DIAGNOSIS — Z8701 Personal history of pneumonia (recurrent): Secondary | ICD-10-CM | POA: Diagnosis not present

## 2015-04-01 DIAGNOSIS — J690 Pneumonitis due to inhalation of food and vomit: Secondary | ICD-10-CM

## 2015-04-01 DIAGNOSIS — R1312 Dysphagia, oropharyngeal phase: Secondary | ICD-10-CM | POA: Diagnosis not present

## 2015-04-19 ENCOUNTER — Ambulatory Visit (INDEPENDENT_AMBULATORY_CARE_PROVIDER_SITE_OTHER): Payer: BLUE CROSS/BLUE SHIELD | Admitting: Cardiovascular Disease

## 2015-04-19 ENCOUNTER — Encounter: Payer: Self-pay | Admitting: Cardiovascular Disease

## 2015-04-19 VITALS — BP 102/68 | HR 88 | Ht 68.0 in | Wt 162.9 lb

## 2015-04-19 DIAGNOSIS — I2510 Atherosclerotic heart disease of native coronary artery without angina pectoris: Secondary | ICD-10-CM | POA: Diagnosis not present

## 2015-04-19 DIAGNOSIS — I480 Paroxysmal atrial fibrillation: Secondary | ICD-10-CM

## 2015-04-19 NOTE — Progress Notes (Signed)
Patient ID: Benjamin Blake, male   DOB: March 19, 1951, 64 y.o.   MRN: [id number]     Cardiology Office Note   Date:  04/19/2015   ID:  Benjamin Blake, DOB 06/24/1951, MRN [id number]  PCP:  Lujean Amel, MD  Cardiologist:   Sanda Klein, MD   Chief Complaint  Patient presents with  . Follow-up    Follow-up nuc study.  No complaints.      History of Present Illness: Benjamin Blake is a 64 y.o. male who presents for follow-up after undergoing a nuclear perfusion study. The study was completely normal without evidence of coronary insufficiency and with normal left ventricular regional and global systolic function. Echo during recent admission showed LVEF 45-50% but the study quality was suboptimal since he was intubated and critically ill at the time.  He is feeling much better. He has recovered from his critical illness. He is now receiving a new regimen of chemotherapy with 5FU, irinotecan and Vectibix. He has significant acneiform dermatitis and has been warned about the risks of neutropenia and thrombus cytopenia. He no longer requires thickened fluids and is having a much easier time staying well-hydrated. His blood pressure is now normal.    Past Medical History  Diagnosis Date  . Cancer   . Coronary artery calcification seen on CAT scan 03/08/2015    Past Surgical History  Procedure Laterality Date  . Colon surgery       Current Outpatient Prescriptions  Medication Sig Dispense Refill  . aspirin EC 81 MG tablet Take 1 tablet (81 mg total) by mouth daily. 90 tablet 3  . Cetirizine HCl 10 MG CAPS Take 10 mg by mouth daily.    Marland Kitchen dexamethasone (DECADRON) 4 MG tablet Take 8mg  (2 x 4mg  tablets) by mouth in the morning for 2 days after the first day of each cycle, then as directed.    Marland Kitchen FLUOCINOLONE ACETONIDE BODY 0.01 % OIL Apply topically daily.    . magnesium oxide (MAG-OX) 400 MG tablet Take by mouth 2 (two) times daily.    . metroNIDAZOLE (METROCREAM) 0.75 % cream daily.    .  minocycline (MINOCIN,DYNACIN) 100 MG capsule Take 100 mg by mouth daily.     . ondansetron (ZOFRAN) 8 MG tablet Take 1 tablet (8 mg total) by mouth every 8 (eight) hours as needed for nausea or vomiting. 30 tablet 0  . potassium chloride SA (K-DUR,KLOR-CON) 20 MEQ tablet Take by mouth daily.    . rosuvastatin (CRESTOR) 10 MG tablet Take 10 mg by mouth daily.     No current facility-administered medications for this visit.    Allergies:   Review of patient's allergies indicates no known allergies.    Social History:  The patient  reports that he has never smoked. He does not have any smokeless tobacco history on file. He reports that he drinks alcohol. He reports that he does not use illicit drugs.   Family History:  The patient's family history includes Cancer - Colon in his mother; Heart disease in his father.    ROS:  Please see the history of present illness.    Otherwise, review of systems positive for none.   All other systems are reviewed and negative.    PHYSICAL EXAM: VS:  BP 102/68 mmHg  Pulse 88  Ht 5\' 8"  (1.727 m)  Wt 162 lb 14.4 oz (73.891 kg)  BMI 24.77 kg/m2 , BMI Body mass index is 24.77 kg/(m^2).  General: Alert, oriented x3, no distress. Prominent acneiform dermatitis. He  is no longer pale. Head: no evidence of trauma, PERRL, EOMI, no exophtalmos or lid lag, no myxedema, no xanthelasma; normal ears, nose and oropharynx Neck: normal jugular venous pulsations and no hepatojugular reflux; brisk carotid pulses without delay and no carotid bruits Chest: clear to auscultation, no signs of consolidation by percussion or palpation, normal fremitus, symmetrical and full respiratory excursions Cardiovascular: normal position and quality of the apical impulse, regular rhythm, normal first and second heart sounds, no murmurs, rubs or gallops Abdomen: no tenderness or distention, no masses by palpation, no abnormal pulsatility or arterial bruits, normal bowel sounds, no  hepatosplenomegaly Extremities: no clubbing, cyanosis or edema; 2+ radial, ulnar and brachial pulses bilaterally; 2+ right femoral, posterior tibial and dorsalis pedis pulses; 2+ left femoral, posterior tibial and dorsalis pedis pulses; no subclavian or femoral bruits Neurological: grossly nonfocal Psych: euthymic mood, full affect   EKG:  EKG is not ordered today.   Recent Labs: 02/17/2015: B Natriuretic Peptide 59.4 02/18/2015: TSH 1.076 02/25/2015: ALT 65*; BUN 19; Creatinine 0.79; Hemoglobin 10.1*; Magnesium 1.8; Platelets 179; Potassium 3.5; Sodium 139    Lipid Panel    Component Value Date/Time   TRIG 149 02/25/2015 0602      Wt Readings from Last 3 Encounters:  04/19/15 162 lb 14.4 oz (73.891 kg)  03/13/15 145 lb (65.772 kg)  03/06/15 145 lb (65.772 kg)      ASSESSMENT AND PLAN:  Although it is clear that Mr. Wandler has coronary calcifications and likely atherosclerosis. There appears to be no evidence of coronary insufficiency and he has normal left ventricular systolic function wall motion by the most recent functional study. I do not think further coronary evaluation is indicated at this time. He should remain on statin therapy. Aspirin is also indicated, but its use needs to be judiciously recommended in view of the risks of thrombocytopenia with his current chemotherapy. I will take his oncologist advice on when it is safe to take aspirin on a daily basis. He will follow-up yearly barring any new cardiac development.   Current medicines are reviewed at length with the patient today.  The patient does not have concerns regarding medicines.  The following changes have been made:  no change  Labs/ tests ordered today include:  No orders of the defined types were placed in this encounter.    Patient Instructions  Dr Sallyanne Kuster recommends that you schedule a follow-up appointment in 12 months. You will receive a reminder letter in the mail two months in advance. If you  don't receive a letter, please call our office to schedule the follow-up appointment.    Mikael Spray, MD  04/19/2015 2:53 PM    Sanda Klein, MD, [facility] [phone] office [phone] pager

## 2015-04-19 NOTE — Patient Instructions (Signed)
Dr Croitoru recommends that you schedule a follow-up appointment in 12 months. You will receive a reminder letter in the mail two months in advance. If you don't receive a letter, please call our office to schedule the follow-up appointment. 

## 2015-11-01 ENCOUNTER — Encounter (HOSPITAL_COMMUNITY): Payer: Self-pay | Admitting: Emergency Medicine

## 2015-11-01 ENCOUNTER — Emergency Department (HOSPITAL_COMMUNITY)
Admission: EM | Admit: 2015-11-01 | Discharge: 2015-11-01 | Disposition: A | Discharge status: Home / Self Care | Payer: BLUE CROSS/BLUE SHIELD | Attending: Emergency Medicine | Admitting: Emergency Medicine

## 2015-11-01 ENCOUNTER — Emergency Department (HOSPITAL_COMMUNITY): Payer: BLUE CROSS/BLUE SHIELD

## 2015-11-01 DIAGNOSIS — Z9221 Personal history of antineoplastic chemotherapy: Secondary | ICD-10-CM | POA: Insufficient documentation

## 2015-11-01 DIAGNOSIS — Z85038 Personal history of other malignant neoplasm of large intestine: Secondary | ICD-10-CM | POA: Insufficient documentation

## 2015-11-01 DIAGNOSIS — R197 Diarrhea, unspecified: Secondary | ICD-10-CM | POA: Diagnosis not present

## 2015-11-01 DIAGNOSIS — Z7982 Long term (current) use of aspirin: Secondary | ICD-10-CM | POA: Insufficient documentation

## 2015-11-01 DIAGNOSIS — R630 Anorexia: Secondary | ICD-10-CM | POA: Diagnosis not present

## 2015-11-01 DIAGNOSIS — R5383 Other fatigue: Secondary | ICD-10-CM | POA: Insufficient documentation

## 2015-11-01 DIAGNOSIS — I2510 Atherosclerotic heart disease of native coronary artery without angina pectoris: Secondary | ICD-10-CM | POA: Insufficient documentation

## 2015-11-01 DIAGNOSIS — Z79899 Other long term (current) drug therapy: Secondary | ICD-10-CM | POA: Insufficient documentation

## 2015-11-01 DIAGNOSIS — N390 Urinary tract infection, site not specified: Secondary | ICD-10-CM

## 2015-11-01 DIAGNOSIS — E860 Dehydration: Secondary | ICD-10-CM | POA: Insufficient documentation

## 2015-11-01 DIAGNOSIS — R319 Hematuria, unspecified: Secondary | ICD-10-CM | POA: Diagnosis present

## 2015-11-01 DIAGNOSIS — K1379 Other lesions of oral mucosa: Secondary | ICD-10-CM | POA: Insufficient documentation

## 2015-11-01 LAB — COMPREHENSIVE METABOLIC PANEL
ALT: 165 U/L — AB (ref 17–63)
AST: 198 U/L — AB (ref 15–41)
Albumin: 2.6 g/dL — ABNORMAL LOW (ref 3.5–5.0)
Alkaline Phosphatase: 158 U/L — ABNORMAL HIGH (ref 38–126)
Anion gap: 11 (ref 5–15)
BILIRUBIN TOTAL: 1.8 mg/dL — AB (ref 0.3–1.2)
BUN: 12 mg/dL (ref 6–20)
CO2: 24 mmol/L (ref 22–32)
Calcium: 7.9 mg/dL — ABNORMAL LOW (ref 8.9–10.3)
Chloride: 95 mmol/L — ABNORMAL LOW (ref 101–111)
Creatinine, Ser: 1.25 mg/dL — ABNORMAL HIGH (ref 0.61–1.24)
GFR calc Af Amer: >60 mL/min (ref >60)
GFR calc non Af Amer: >60 mL/min (ref >60)
GLUCOSE: 125 mg/dL — AB (ref 65–99)
Potassium: 3.4 mmol/L — ABNORMAL LOW (ref 3.5–5.1)
Sodium: 130 mmol/L — ABNORMAL LOW (ref 135–145)
TOTAL PROTEIN: 5.8 g/dL — AB (ref 6.5–8.1)

## 2015-11-01 LAB — URINALYSIS, ROUTINE W REFLEX MICROSCOPIC
Glucose, UA: NEGATIVE mg/dL
Hgb urine dipstick: NEGATIVE
Ketones, ur: 40 mg/dL — AB
Nitrite: POSITIVE — AB
PH: 6 (ref 5.0–8.0)
Protein, ur: >300 mg/dL — AB
Specific Gravity, Urine: 1.033 — ABNORMAL HIGH (ref 1.005–1.030)

## 2015-11-01 LAB — URINE MICROSCOPIC-ADD ON

## 2015-11-01 LAB — CBC
HEMATOCRIT: 41.2 % (ref 39.0–52.0)
Hemoglobin: 14 g/dL (ref 13.0–17.0)
MCH: 27.7 pg (ref 26.0–34.0)
MCHC: 34 g/dL (ref 30.0–36.0)
MCV: 81.4 fL (ref 78.0–100.0)
Platelets: 96 10*3/uL — ABNORMAL LOW (ref 150–400)
RBC: 5.06 MIL/uL (ref 4.22–5.81)
RDW: 14.1 % (ref 11.5–15.5)
WBC: 8.7 10*3/uL (ref 4.0–10.5)

## 2015-11-01 LAB — I-STAT CG4 LACTIC ACID, ED: LACTIC ACID, VENOUS: 1.21 mmol/L (ref 0.5–2.0)

## 2015-11-01 LAB — CBG MONITORING, ED: Glucose-Capillary: 114 mg/dL — ABNORMAL HIGH (ref 65–99)

## 2015-11-01 MED ORDER — ACETAMINOPHEN 325 MG PO TABS
ORAL_TABLET | ORAL | Status: AC
  Filled 2015-11-01: qty 2

## 2015-11-01 MED ORDER — ACETAMINOPHEN 325 MG PO TABS
325.0000 mg | ORAL_TABLET | Freq: Once | ORAL | Status: AC
  Administered 2015-11-01: 325 mg via ORAL

## 2015-11-01 MED ORDER — CIPROFLOXACIN HCL 250 MG PO TABS: 250.0000 mg | ORAL_TABLET | Freq: Two times a day (BID) | ORAL | Status: DC

## 2015-11-01 MED ORDER — DEXTROSE 5 % IV SOLN
1.0000 g | Freq: Once | INTRAVENOUS | Status: AC
  Administered 2015-11-01: 1 g via INTRAVENOUS
  Filled 2015-11-01: qty 10

## 2015-11-01 MED ORDER — SODIUM CHLORIDE 0.9 % IV BOLUS (SEPSIS)
1000.0000 mL | Freq: Once | INTRAVENOUS | Status: AC
  Administered 2015-11-01: 1000 mL via INTRAVENOUS

## 2015-11-01 MED ORDER — HEPARIN SOD (PORK) LOCK FLUSH 100 UNIT/ML IV SOLN
500.0000 [IU] | Freq: Once | INTRAVENOUS | Status: AC
  Administered 2015-11-01: 500 [IU]
  Filled 2015-11-01: qty 5

## 2015-11-01 NOTE — ED Notes (Signed)
Pt just returned from xray.Benjamin Blake Has had a elevated temp

## 2015-11-01 NOTE — ED Notes (Signed)
Pt waiting for  His porta-cath to be accessed to be discharged

## 2015-11-01 NOTE — ED Notes (Signed)
Patient presents to ED c/o generalized weakness with increased tiredness fever and chills onset 1 weeks ago. States he was seen by his GP on Monday and was told it was prob. Viral however was given a Z-pak  To take if he didn't see improvement , states she started on his z-pak yest. C/o decreased appetite is currently a chemo patient at [facility] for colon cancer last chemo was 11/10.

## 2015-11-01 NOTE — ED Notes (Signed)
Pt. Stated, my symptoms started on Wednesday with mostly fatigue fever and chills, given z-pak since the doctor was going out of the country.  I started taking the Z-Pak yesterday. I still feel bad.  Drinking a lot , but my appetite is horrible, mu urine was really dark.  Im a chemo therapy pt. My last chemo was 2 weeks .

## 2015-11-01 NOTE — Discharge Instructions (Signed)

## 2015-11-01 NOTE — ED Provider Notes (Signed)
CSN: [id number]     Arrival date & time 11/01/15  1155 History   First MD Initiated Contact with Patient 11/01/15 1339     Chief Complaint  Patient presents with  . Dehydration  . Hematuria  . Fatigue  . Colon Cancer   (Consider location/radiation/quality/duration/timing/severity/associated sxs/prior Treatment)  HPI  Patient is a 64 year old man with history of colon cancer on chemotherapy who presents with 1 week of fever, fatigue, and mouth sores. Patient reports that he saw his PCP earlier this week who thought he had a viral URI, however he was given a prescription for azithromycin to take just in case he did not improve, which he started yesterday. Patient also reports increased fatigability and intermittent chills and fevers for which he has been taking tylenol and aspirin. Patient has also noticed that his urine has been darker in color over the past day. No dysuria. No flank pain. No muscle aches. No chest pain or shortness of breath. No nausea or vomiting. Has diarrhea at baseline that has been well controlled with imodium. Reports that he feels dehydrated and that he has poor appetite, but has been drinking up to 64 ounces of fluid daily.   Past Medical History  Diagnosis Date  . Cancer ([place])   . Coronary artery calcification seen on CAT scan 03/08/2015   Past Surgical History  Procedure Laterality Date  . Colon surgery     Family History  Problem Relation Age of Onset  . Cancer - Colon Mother   . Heart disease Father    Social History  Substance Use Topics  . Smoking status: Never Smoker   . Smokeless tobacco: None  . Alcohol Use: Yes     Comment: Social drinker    Review of Systems  Constitutional: Positive for fever, chills, diaphoresis and appetite change.  HENT: Positive for mouth sores.   Eyes: Negative.   Respiratory: Negative for shortness of breath.   Cardiovascular: Negative for chest pain.  Gastrointestinal: Positive for diarrhea. Negative for nausea  and vomiting.  Endocrine: Negative.   Genitourinary: Negative for dysuria and flank pain.  Musculoskeletal: Negative.   Skin: Negative.   Allergic/Immunologic: Negative.   Neurological: Negative.   Hematological: Negative.   Psychiatric/Behavioral: Negative.    Allergies  Review of patient's allergies indicates no known allergies.  Home Medications   Prior to Admission medications   Medication Sig Start Date End Date Taking? Authorizing Provider  azithromycin (ZITHROMAX) 250 MG tablet Take 250-500 mg by mouth daily. For 5 days. 500 mg for day 1 then 250 mg for 4 days   Yes Historical Provider, MD  Cetirizine HCl 10 MG CAPS Take 10 mg by mouth daily.   Yes Historical Provider, MD  FLUOCINOLONE ACETONIDE BODY 0.01 % OIL Apply topically daily. 04/11/15 04/10/16 Yes Historical Provider, MD  magnesium oxide (MAG-OX) 400 MG tablet Take by mouth 2 (two) times daily. 03/28/15 03/27/16 Yes Historical Provider, MD  metroNIDAZOLE (METROCREAM) 0.75 % cream daily. 03/14/15  Yes Historical Provider, MD  minocycline (MINOCIN,DYNACIN) 100 MG capsule Take 100 mg by mouth daily.  03/14/15  Yes Historical Provider, MD  ondansetron (ZOFRAN) 8 MG tablet Take 1 tablet (8 mg total) by mouth every 8 (eight) hours as needed for nausea or vomiting. 02/27/15  Yes Thurnell Lose, MD  PRESCRIPTION MEDICATION Chemotherapy regimen- [facility]. Every 2 weeks. Next treatment is due 11-07-15. Followed by  Dr. Clelia Croft.   Yes Historical Provider, MD  rosuvastatin (CRESTOR) 10  MG tablet Take 10 mg by mouth daily. 09/08/14  Yes Historical Provider, MD  aspirin EC 81 MG tablet Take 1 tablet (81 mg total) by mouth daily. 03/06/15   Mihai Croitoru, MD  ciprofloxacin (CIPRO) 250 MG tablet Take 1 tablet (250 mg total) by mouth 2 (two) times daily. 11/01/15   Vivi Barrack, MD  dexamethasone (DECADRON) 4 MG tablet Take 8mg  (2 x 4mg  tablets) by mouth in the morning for 2 days after the first day of each cycle, then as directed.  03/14/15   Historical Provider, MD  potassium chloride SA (K-DUR,KLOR-CON) 20 MEQ tablet Take by mouth daily. 03/07/15 03/06/16  Historical Provider, MD   BP 107/64 mmHg  Pulse 96  Temp(Src) 100 F (37.8 C) (Oral)  Resp 18  Ht 5\' 9"  (1.753 m)  Wt 81.647 kg  BMI 26.57 kg/m2  SpO2 94% Physical Exam  Constitutional: He is oriented to person, place, and time. He appears well-developed and well-nourished. No distress.  HENT:  Head: Normocephalic and atraumatic.  Eyes: EOM are normal. Pupils are equal, round, and reactive to light.  Neck: Normal range of motion. Neck supple.  Cardiovascular: Normal rate, regular rhythm and normal heart sounds.   No murmur heard. Pulmonary/Chest: Effort normal and breath sounds normal. No respiratory distress.  Abdominal: Soft. Bowel sounds are normal. He exhibits no distension. There is no tenderness.  Musculoskeletal: Normal range of motion. He exhibits no edema or tenderness.  Neurological: He is alert and oriented to person, place, and time. No cranial nerve deficit.  Skin: Skin is warm and dry. No rash noted. No erythema.  Psychiatric: He has a normal mood and affect. His behavior is normal.  Nursing note and vitals reviewed.   ED Course  Procedures (including critical care time) Labs Review Labs Reviewed  CBC - Abnormal; Notable for the following:    Platelets 96 (*)    All other components within normal limits  URINALYSIS, ROUTINE W REFLEX MICROSCOPIC (NOT AT [facility]) - Abnormal; Notable for the following:    Color, Urine AMBER (*)    APPearance CLOUDY (*)    Specific Gravity, Urine 1.033 (*)    Bilirubin Urine MODERATE (*)    Ketones, ur 40 (*)    Protein, ur >300 (*)    Nitrite POSITIVE (*)    Leukocytes, UA SMALL (*)    All other components within normal limits  COMPREHENSIVE METABOLIC PANEL - Abnormal; Notable for the following:    Sodium 130 (*)    Potassium 3.4 (*)    Chloride 95 (*)    Glucose, Bld 125 (*)    Creatinine, Ser 1.25  (*)    Calcium 7.9 (*)    Total Protein 5.8 (*)    Albumin 2.6 (*)    AST 198 (*)    ALT 165 (*)    Alkaline Phosphatase 158 (*)    Total Bilirubin 1.8 (*)    All other components within normal limits  URINE MICROSCOPIC-ADD ON - Abnormal; Notable for the following:    Squamous Epithelial / LPF 0-5 (*)    Bacteria, UA MANY (*)    Casts GRANULAR CAST (*)    All other components within normal limits  CBG MONITORING, ED - Abnormal; Notable for the following:    Glucose-Capillary 114 (*)    All other components within normal limits  URINE CULTURE  CULTURE, BLOOD (ROUTINE X 2)  CULTURE, BLOOD (ROUTINE X 2)  I-STAT CG4 LACTIC ACID, ED  Imaging Review Dg Chest 2 View  11/01/2015  CLINICAL DATA:  Fever cold and chills, weakness and lethargy EXAM: CHEST  2 VIEW COMPARISON:  Radiograph 03/05/2015 FINDINGS: LEFT power port. Normal cardiac silhouette. No effusion, infiltrate, or pneumothorax. IMPRESSION: No acute cardiopulmonary process. Electronically Signed   By: Suzy Bouchard M.D.   On: 11/01/2015 15:10   I have personally reviewed and evaluated these images and lab results as part of my medical decision-making.   EKG Interpretation None      MDM   Final diagnoses:  UTI (lower urinary tract infection)   Patient is a 64 year old man with history of colon cancer on chemotherapy who presents with 1 week of fever, fatigue, and mouth sores. Patient noted to be initially febrile and tachycardic here, however the rest of his physical exam was unremarkable. UA was suggestive of UTI. No elevated white count or CVA tenderness to suggest pyelonephritis. Patient was given 1g of rocephin here. HR improved after 1L NS. CXR without signs of infection. Blood and urine cultures drawn. Will discharge patient home with course of ciprofloxacin for UTI. Discussed case with [facility], agreed with plan of care.    Vivi Barrack, MD 11/01/15 1604  Dorie Rank, MD 11/02/15 [phone]

## 2015-11-02 LAB — URINE CULTURE: Culture: NO GROWTH

## 2015-11-03 ENCOUNTER — Telehealth (HOSPITAL_BASED_OUTPATIENT_CLINIC_OR_DEPARTMENT_OTHER): Payer: Self-pay | Admitting: Emergency Medicine

## 2015-11-06 LAB — CULTURE, BLOOD (ROUTINE X 2)
CULTURE: NO GROWTH
Culture: NO GROWTH

## 2016-05-11 IMAGING — CR DG CHEST 1V PORT
1 series · 1 of 1 positions shown · non-contrast
Comparison: 02/16/2015

CLINICAL DATA: Severe dyspnea and wheezing

EXAM:
PORTABLE CHEST - 1 VIEW

[portable]
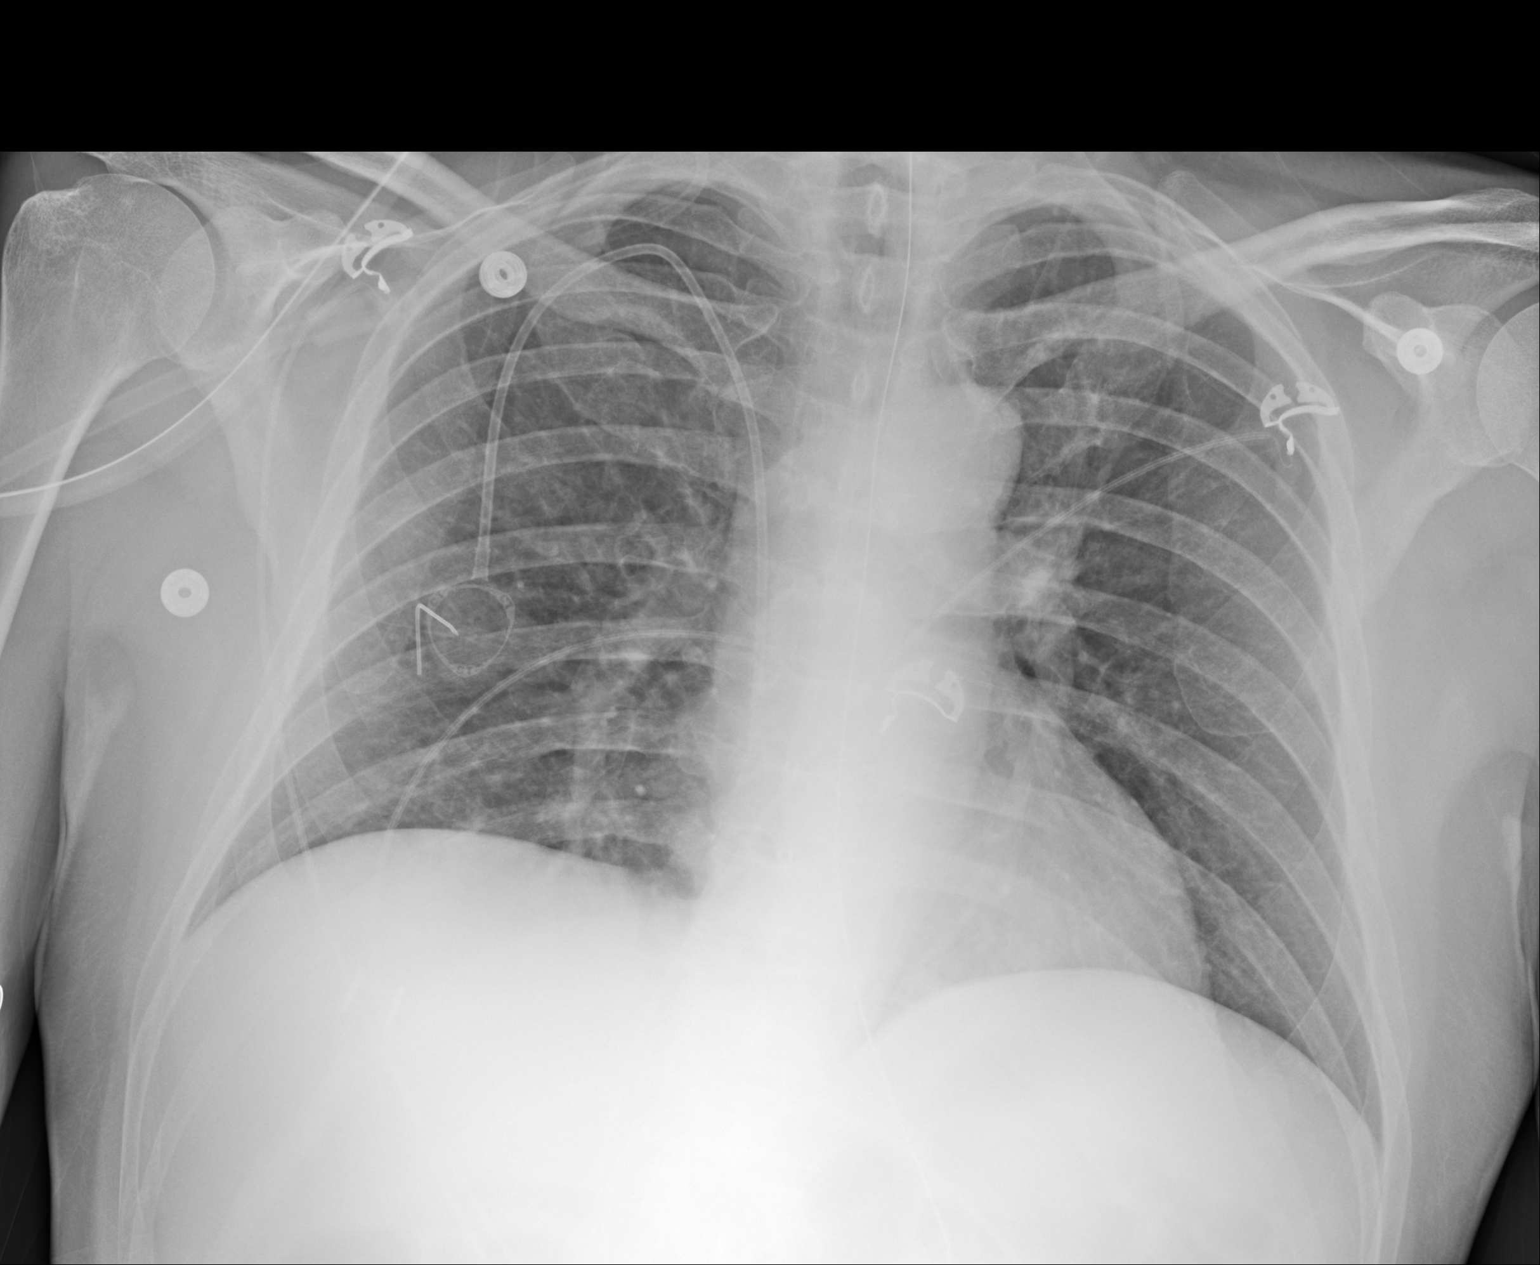

[1 of 1 positions shown; findings below may reference images not displayed]

FINDINGS: There is a right sided Port-A-Cath with tip in the region of the
cavoatrial junction. The nasogastric tube extends below the
diaphragm and off the inferior edge of the image. The lungs are
clear. The pulmonary vasculature is normal. There are no large
effusions.
IMPRESSION: No active disease.

## 2016-05-12 IMAGING — CT CT ANGIO CHEST
1 of 9 series · 14 of 36 positions shown · IV contrast (Iohexol (Omnipaque 350))
Comparison: Radiographs 02/17/2015

CLINICAL DATA: Dyspnea

EXAM:
CT ANGIOGRAPHY CHEST WITH CONTRAST
TECHNIQUE: Multidetector CT imaging of the chest was performed using the
standard protocol during bolus administration of intravenous
contrast. Multiplanar CT image reconstructions and MIPs were
obtained to evaluate the vascular anatomy.
CONTRAST:  75mL OMNIPAQUE IOHEXOL 350 MG/ML SOLN

[Series 407: thins pacs · axial · 0.73mm/px · z∈[+94,+346]mm · 14 of 292 slices shown]
[im 20/292  lung]
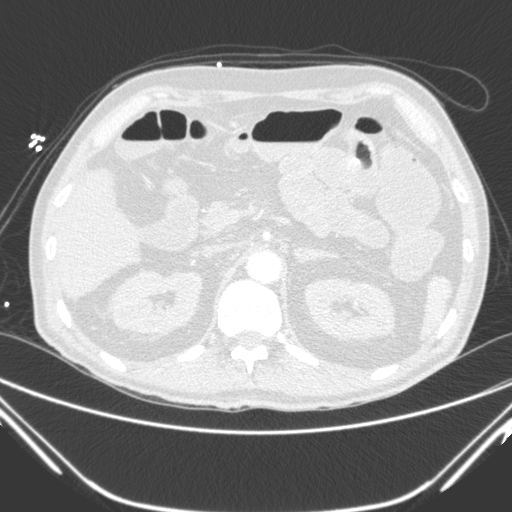
[im 39/292  mediastinal]
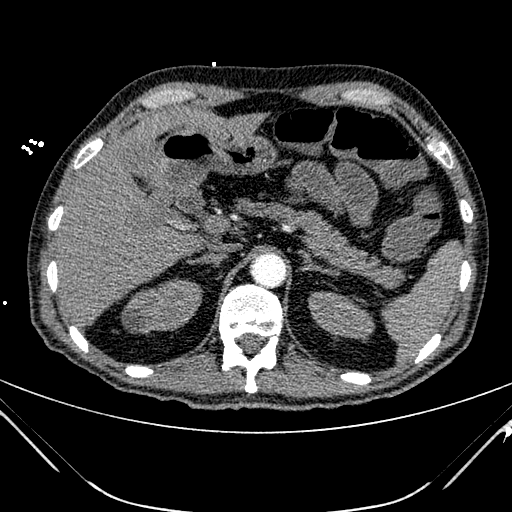
[im 59/292  lung]
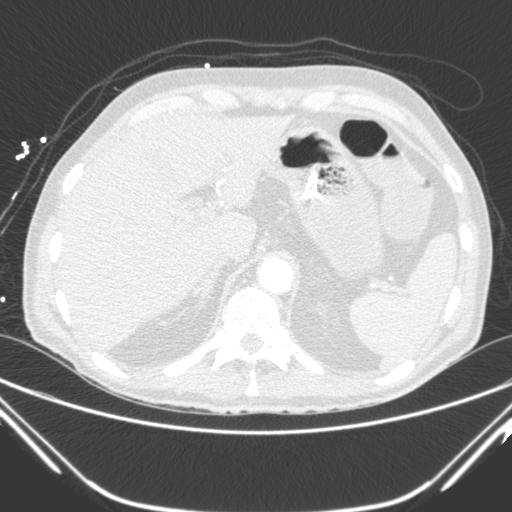
[im 78/292  mediastinal]
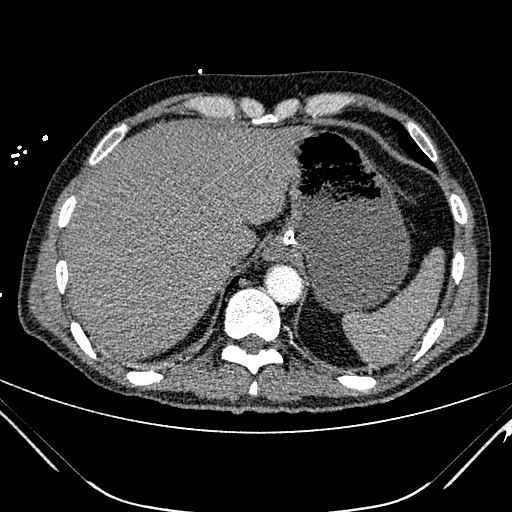
[im 98/292  lung]
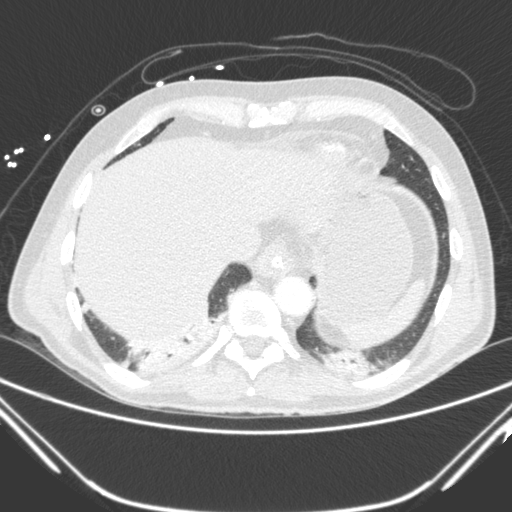
[im 117/292  mediastinal]
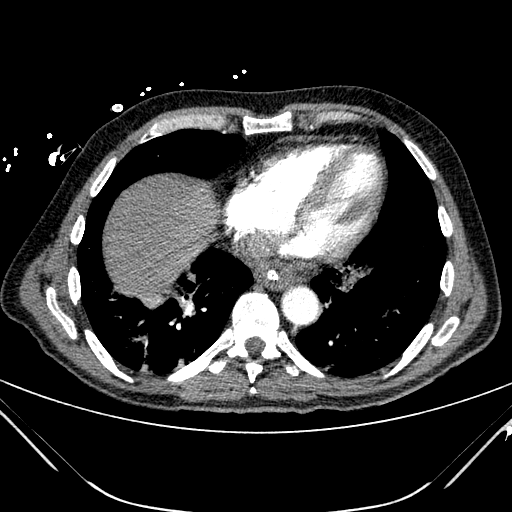
[im 136/292  lung]
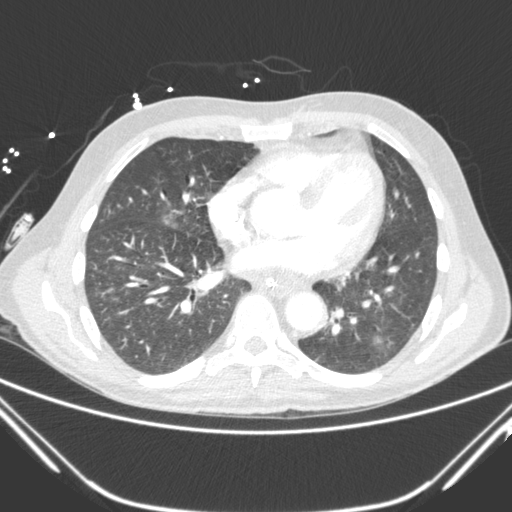
[im 156/292  mediastinal]
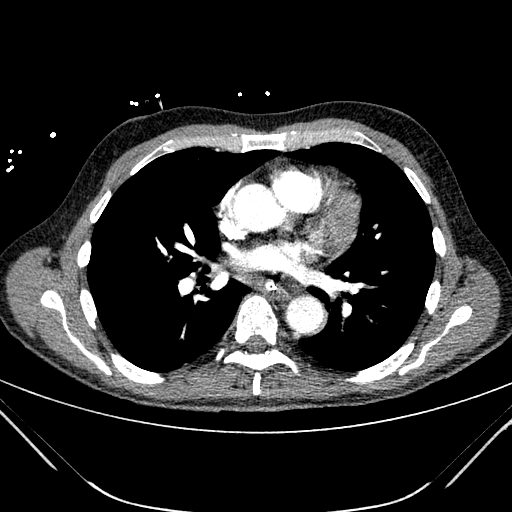
[im 175/292  lung]
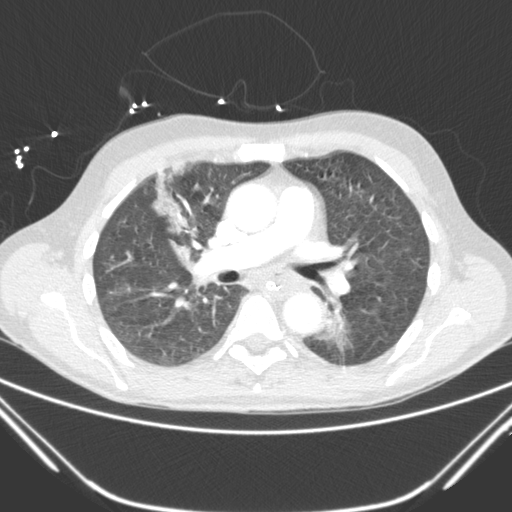
[im 195/292  mediastinal]
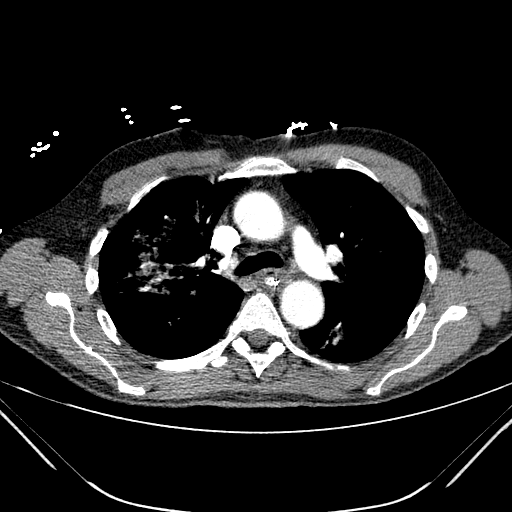
[im 214/292  lung]
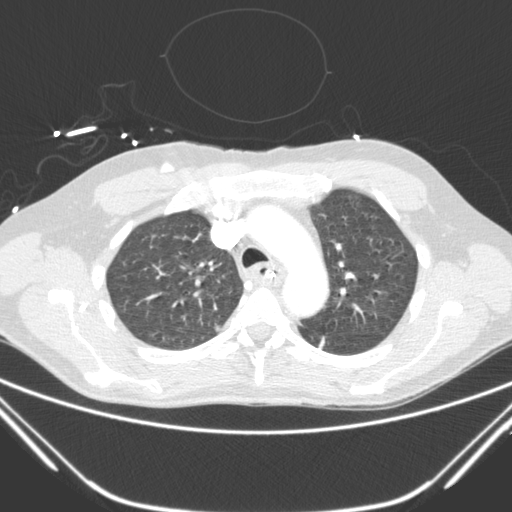
[im 233/292  mediastinal]
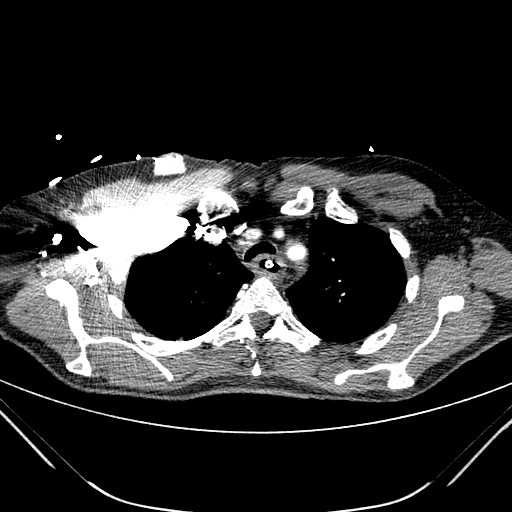
[im 253/292  lung]
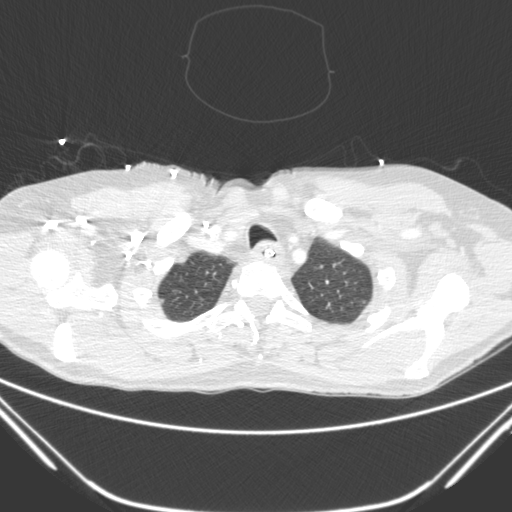
[im 272/292  mediastinal]
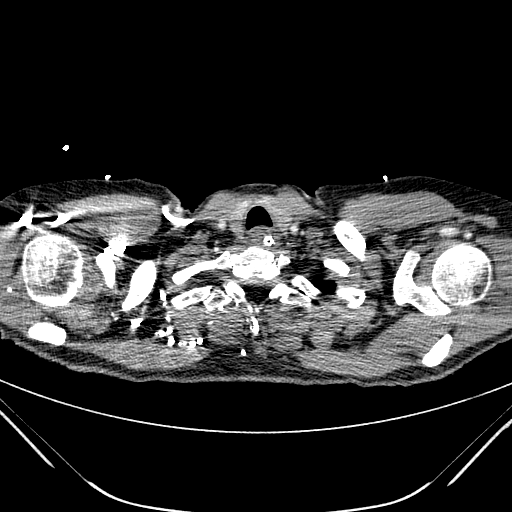

[14 of 36 positions shown; findings below may reference images not displayed]

FINDINGS: Cardiovascular: There is good opacification of the pulmonary
arteries. There is no pulmonary embolism. The thoracic aorta is
normal in caliber and intact.

Lungs: There are multifocal airspace opacities, involving the right
upper lobe to the greatest degree but also both lower lobes and
minimal involvement in the right middle lobe. These opacities may
represent multifocal pneumonia. Pulmonary hemorrhage could also have
this appearance.

Central airways: Patent

Effusions: None

Lymphadenopathy: None

Esophagus: Nasogastric tube extends to the stomach. Mild uniform
mural thickening is suggested throughout the esophagus.

Upper abdomen: 1.5 cm hypodense lesion at the upper pole of the
right kidney corresponding to 1 of the renal cysts observed on
recent sonography. Low-attenuation lesions in the liver,
indeterminate and incompletely imaged.

Musculoskeletal: No significant abnormalities

Review of the MIP images confirms the above findings.
IMPRESSION: 1. Negative for pulmonary embolism
2. Multifocal airspace opacities in both lungs, possibly due to
infectious infiltrates. Pulmonary hemorrhages could also have this
appearance.
3. Low-attenuation liver lesions, not simple cysts. These could
represent metastases.

## 2016-06-11 DIAGNOSIS — Z79899 Other long term (current) drug therapy: Secondary | ICD-10-CM | POA: Diagnosis not present

## 2016-06-11 DIAGNOSIS — T451X5A Adverse effect of antineoplastic and immunosuppressive drugs, initial encounter: Secondary | ICD-10-CM | POA: Diagnosis not present

## 2016-06-11 DIAGNOSIS — Z5112 Encounter for antineoplastic immunotherapy: Secondary | ICD-10-CM | POA: Diagnosis not present

## 2016-06-11 DIAGNOSIS — R197 Diarrhea, unspecified: Secondary | ICD-10-CM | POA: Diagnosis not present

## 2016-06-11 DIAGNOSIS — R21 Rash and other nonspecific skin eruption: Secondary | ICD-10-CM | POA: Diagnosis not present

## 2016-06-11 DIAGNOSIS — Z5111 Encounter for antineoplastic chemotherapy: Secondary | ICD-10-CM | POA: Diagnosis not present

## 2016-06-11 DIAGNOSIS — C787 Secondary malignant neoplasm of liver and intrahepatic bile duct: Secondary | ICD-10-CM | POA: Diagnosis not present

## 2016-06-11 DIAGNOSIS — C182 Malignant neoplasm of ascending colon: Secondary | ICD-10-CM | POA: Diagnosis not present

## 2016-08-06 DIAGNOSIS — C189 Malignant neoplasm of colon, unspecified: Secondary | ICD-10-CM | POA: Diagnosis not present

## 2016-08-06 DIAGNOSIS — R918 Other nonspecific abnormal finding of lung field: Secondary | ICD-10-CM | POA: Diagnosis not present

## 2016-09-24 DIAGNOSIS — K1230 Oral mucositis (ulcerative), unspecified: Secondary | ICD-10-CM | POA: Diagnosis not present

## 2016-09-24 DIAGNOSIS — Z5111 Encounter for antineoplastic chemotherapy: Secondary | ICD-10-CM | POA: Diagnosis not present

## 2016-09-24 DIAGNOSIS — C7800 Secondary malignant neoplasm of unspecified lung: Secondary | ICD-10-CM | POA: Diagnosis not present

## 2016-09-24 DIAGNOSIS — C787 Secondary malignant neoplasm of liver and intrahepatic bile duct: Secondary | ICD-10-CM | POA: Diagnosis not present

## 2016-09-24 DIAGNOSIS — C182 Malignant neoplasm of ascending colon: Secondary | ICD-10-CM | POA: Diagnosis not present

## 2016-10-08 DIAGNOSIS — L270 Generalized skin eruption due to drugs and medicaments taken internally: Secondary | ICD-10-CM | POA: Diagnosis not present

## 2016-10-08 DIAGNOSIS — Z5112 Encounter for antineoplastic immunotherapy: Secondary | ICD-10-CM | POA: Diagnosis not present

## 2016-10-08 DIAGNOSIS — C7802 Secondary malignant neoplasm of left lung: Secondary | ICD-10-CM | POA: Diagnosis not present

## 2016-10-08 DIAGNOSIS — C189 Malignant neoplasm of colon, unspecified: Secondary | ICD-10-CM | POA: Diagnosis not present

## 2016-10-08 DIAGNOSIS — Z9049 Acquired absence of other specified parts of digestive tract: Secondary | ICD-10-CM | POA: Diagnosis not present

## 2016-10-08 DIAGNOSIS — N281 Cyst of kidney, acquired: Secondary | ICD-10-CM | POA: Diagnosis not present

## 2016-10-08 DIAGNOSIS — Z5111 Encounter for antineoplastic chemotherapy: Secondary | ICD-10-CM | POA: Diagnosis not present

## 2016-10-08 DIAGNOSIS — R21 Rash and other nonspecific skin eruption: Secondary | ICD-10-CM | POA: Diagnosis not present

## 2016-10-08 DIAGNOSIS — R911 Solitary pulmonary nodule: Secondary | ICD-10-CM | POA: Diagnosis not present

## 2016-10-08 DIAGNOSIS — C787 Secondary malignant neoplasm of liver and intrahepatic bile duct: Secondary | ICD-10-CM | POA: Diagnosis not present

## 2016-10-22 DIAGNOSIS — C787 Secondary malignant neoplasm of liver and intrahepatic bile duct: Secondary | ICD-10-CM | POA: Diagnosis not present

## 2016-10-22 DIAGNOSIS — C189 Malignant neoplasm of colon, unspecified: Secondary | ICD-10-CM | POA: Diagnosis not present

## 2016-10-22 DIAGNOSIS — C7802 Secondary malignant neoplasm of left lung: Secondary | ICD-10-CM | POA: Diagnosis not present

## 2016-10-22 DIAGNOSIS — Z9049 Acquired absence of other specified parts of digestive tract: Secondary | ICD-10-CM | POA: Diagnosis not present

## 2016-10-22 DIAGNOSIS — Z5112 Encounter for antineoplastic immunotherapy: Secondary | ICD-10-CM | POA: Diagnosis not present

## 2016-10-22 DIAGNOSIS — Z5111 Encounter for antineoplastic chemotherapy: Secondary | ICD-10-CM | POA: Diagnosis not present

## 2016-10-23 DIAGNOSIS — C189 Malignant neoplasm of colon, unspecified: Secondary | ICD-10-CM | POA: Diagnosis not present

## 2016-10-24 DIAGNOSIS — C189 Malignant neoplasm of colon, unspecified: Secondary | ICD-10-CM | POA: Diagnosis not present

## 2016-11-05 DIAGNOSIS — T451X5A Adverse effect of antineoplastic and immunosuppressive drugs, initial encounter: Secondary | ICD-10-CM | POA: Diagnosis not present

## 2016-11-05 DIAGNOSIS — C787 Secondary malignant neoplasm of liver and intrahepatic bile duct: Secondary | ICD-10-CM | POA: Diagnosis not present

## 2016-11-05 DIAGNOSIS — C7802 Secondary malignant neoplasm of left lung: Secondary | ICD-10-CM | POA: Diagnosis not present

## 2016-11-05 DIAGNOSIS — Z5111 Encounter for antineoplastic chemotherapy: Secondary | ICD-10-CM | POA: Diagnosis not present

## 2016-11-05 DIAGNOSIS — Z5112 Encounter for antineoplastic immunotherapy: Secondary | ICD-10-CM | POA: Diagnosis not present

## 2016-11-05 DIAGNOSIS — R21 Rash and other nonspecific skin eruption: Secondary | ICD-10-CM | POA: Diagnosis not present

## 2016-11-05 DIAGNOSIS — Z9049 Acquired absence of other specified parts of digestive tract: Secondary | ICD-10-CM | POA: Diagnosis not present

## 2016-11-05 DIAGNOSIS — C189 Malignant neoplasm of colon, unspecified: Secondary | ICD-10-CM | POA: Diagnosis not present

## 2016-11-05 DIAGNOSIS — C786 Secondary malignant neoplasm of retroperitoneum and peritoneum: Secondary | ICD-10-CM | POA: Diagnosis not present

## 2016-11-07 DIAGNOSIS — C189 Malignant neoplasm of colon, unspecified: Secondary | ICD-10-CM | POA: Diagnosis not present

## 2016-11-19 DIAGNOSIS — R21 Rash and other nonspecific skin eruption: Secondary | ICD-10-CM | POA: Diagnosis not present

## 2016-11-19 DIAGNOSIS — C189 Malignant neoplasm of colon, unspecified: Secondary | ICD-10-CM | POA: Diagnosis not present

## 2016-11-19 DIAGNOSIS — C7800 Secondary malignant neoplasm of unspecified lung: Secondary | ICD-10-CM | POA: Diagnosis not present

## 2016-11-19 DIAGNOSIS — C19 Malignant neoplasm of rectosigmoid junction: Secondary | ICD-10-CM | POA: Diagnosis not present

## 2016-11-19 DIAGNOSIS — Z9049 Acquired absence of other specified parts of digestive tract: Secondary | ICD-10-CM | POA: Diagnosis not present

## 2016-11-19 DIAGNOSIS — Z5112 Encounter for antineoplastic immunotherapy: Secondary | ICD-10-CM | POA: Diagnosis not present

## 2016-11-19 DIAGNOSIS — Z5111 Encounter for antineoplastic chemotherapy: Secondary | ICD-10-CM | POA: Diagnosis not present

## 2016-11-19 DIAGNOSIS — C787 Secondary malignant neoplasm of liver and intrahepatic bile duct: Secondary | ICD-10-CM | POA: Diagnosis not present

## 2016-11-21 DIAGNOSIS — C189 Malignant neoplasm of colon, unspecified: Secondary | ICD-10-CM | POA: Diagnosis not present

## 2016-12-03 DIAGNOSIS — C19 Malignant neoplasm of rectosigmoid junction: Secondary | ICD-10-CM | POA: Diagnosis not present

## 2016-12-03 DIAGNOSIS — C787 Secondary malignant neoplasm of liver and intrahepatic bile duct: Secondary | ICD-10-CM | POA: Diagnosis not present

## 2016-12-03 DIAGNOSIS — T451X5A Adverse effect of antineoplastic and immunosuppressive drugs, initial encounter: Secondary | ICD-10-CM | POA: Diagnosis not present

## 2016-12-03 DIAGNOSIS — C189 Malignant neoplasm of colon, unspecified: Secondary | ICD-10-CM | POA: Diagnosis not present

## 2016-12-03 DIAGNOSIS — Z9049 Acquired absence of other specified parts of digestive tract: Secondary | ICD-10-CM | POA: Diagnosis not present

## 2016-12-03 DIAGNOSIS — Z5111 Encounter for antineoplastic chemotherapy: Secondary | ICD-10-CM | POA: Diagnosis not present

## 2016-12-03 DIAGNOSIS — C7800 Secondary malignant neoplasm of unspecified lung: Secondary | ICD-10-CM | POA: Diagnosis not present

## 2016-12-03 DIAGNOSIS — L270 Generalized skin eruption due to drugs and medicaments taken internally: Secondary | ICD-10-CM | POA: Diagnosis not present

## 2016-12-05 DIAGNOSIS — C189 Malignant neoplasm of colon, unspecified: Secondary | ICD-10-CM | POA: Diagnosis not present

## 2016-12-17 DIAGNOSIS — R21 Rash and other nonspecific skin eruption: Secondary | ICD-10-CM | POA: Diagnosis not present

## 2016-12-17 DIAGNOSIS — Z5112 Encounter for antineoplastic immunotherapy: Secondary | ICD-10-CM | POA: Diagnosis not present

## 2016-12-17 DIAGNOSIS — Z9049 Acquired absence of other specified parts of digestive tract: Secondary | ICD-10-CM | POA: Diagnosis not present

## 2016-12-17 DIAGNOSIS — C787 Secondary malignant neoplasm of liver and intrahepatic bile duct: Secondary | ICD-10-CM | POA: Diagnosis not present

## 2016-12-17 DIAGNOSIS — C19 Malignant neoplasm of rectosigmoid junction: Secondary | ICD-10-CM | POA: Diagnosis not present

## 2016-12-17 DIAGNOSIS — C7802 Secondary malignant neoplasm of left lung: Secondary | ICD-10-CM | POA: Diagnosis not present

## 2016-12-17 DIAGNOSIS — L270 Generalized skin eruption due to drugs and medicaments taken internally: Secondary | ICD-10-CM | POA: Diagnosis not present

## 2016-12-17 DIAGNOSIS — C189 Malignant neoplasm of colon, unspecified: Secondary | ICD-10-CM | POA: Diagnosis not present

## 2016-12-17 DIAGNOSIS — Z5111 Encounter for antineoplastic chemotherapy: Secondary | ICD-10-CM | POA: Diagnosis not present

## 2016-12-17 DIAGNOSIS — T451X5A Adverse effect of antineoplastic and immunosuppressive drugs, initial encounter: Secondary | ICD-10-CM | POA: Diagnosis not present

## 2016-12-17 DIAGNOSIS — R918 Other nonspecific abnormal finding of lung field: Secondary | ICD-10-CM | POA: Diagnosis not present

## 2016-12-17 DIAGNOSIS — C786 Secondary malignant neoplasm of retroperitoneum and peritoneum: Secondary | ICD-10-CM | POA: Diagnosis not present

## 2016-12-19 DIAGNOSIS — C189 Malignant neoplasm of colon, unspecified: Secondary | ICD-10-CM | POA: Diagnosis not present

## 2016-12-26 DIAGNOSIS — R040 Epistaxis: Secondary | ICD-10-CM | POA: Diagnosis not present

## 2016-12-26 DIAGNOSIS — J029 Acute pharyngitis, unspecified: Secondary | ICD-10-CM | POA: Diagnosis not present

## 2016-12-26 DIAGNOSIS — H6980 Other specified disorders of Eustachian tube, unspecified ear: Secondary | ICD-10-CM | POA: Diagnosis not present

## 2016-12-31 DIAGNOSIS — C7800 Secondary malignant neoplasm of unspecified lung: Secondary | ICD-10-CM | POA: Diagnosis not present

## 2016-12-31 DIAGNOSIS — C182 Malignant neoplasm of ascending colon: Secondary | ICD-10-CM | POA: Diagnosis not present

## 2016-12-31 DIAGNOSIS — C787 Secondary malignant neoplasm of liver and intrahepatic bile duct: Secondary | ICD-10-CM | POA: Diagnosis not present

## 2016-12-31 DIAGNOSIS — Z5111 Encounter for antineoplastic chemotherapy: Secondary | ICD-10-CM | POA: Diagnosis not present

## 2016-12-31 DIAGNOSIS — Z9049 Acquired absence of other specified parts of digestive tract: Secondary | ICD-10-CM | POA: Diagnosis not present

## 2016-12-31 DIAGNOSIS — L270 Generalized skin eruption due to drugs and medicaments taken internally: Secondary | ICD-10-CM | POA: Diagnosis not present

## 2016-12-31 DIAGNOSIS — C786 Secondary malignant neoplasm of retroperitoneum and peritoneum: Secondary | ICD-10-CM | POA: Diagnosis not present

## 2016-12-31 DIAGNOSIS — C189 Malignant neoplasm of colon, unspecified: Secondary | ICD-10-CM | POA: Diagnosis not present

## 2016-12-31 DIAGNOSIS — T451X5A Adverse effect of antineoplastic and immunosuppressive drugs, initial encounter: Secondary | ICD-10-CM | POA: Diagnosis not present

## 2017-01-02 DIAGNOSIS — C189 Malignant neoplasm of colon, unspecified: Secondary | ICD-10-CM | POA: Diagnosis not present

## 2017-01-14 DIAGNOSIS — R600 Localized edema: Secondary | ICD-10-CM | POA: Diagnosis not present

## 2017-01-14 DIAGNOSIS — Z5112 Encounter for antineoplastic immunotherapy: Secondary | ICD-10-CM | POA: Diagnosis not present

## 2017-01-14 DIAGNOSIS — L270 Generalized skin eruption due to drugs and medicaments taken internally: Secondary | ICD-10-CM | POA: Diagnosis not present

## 2017-01-14 DIAGNOSIS — C189 Malignant neoplasm of colon, unspecified: Secondary | ICD-10-CM | POA: Diagnosis not present

## 2017-01-14 DIAGNOSIS — T451X5A Adverse effect of antineoplastic and immunosuppressive drugs, initial encounter: Secondary | ICD-10-CM | POA: Diagnosis not present

## 2017-01-14 DIAGNOSIS — Z9049 Acquired absence of other specified parts of digestive tract: Secondary | ICD-10-CM | POA: Diagnosis not present

## 2017-01-14 DIAGNOSIS — Z5111 Encounter for antineoplastic chemotherapy: Secondary | ICD-10-CM | POA: Diagnosis not present

## 2017-01-14 DIAGNOSIS — R12 Heartburn: Secondary | ICD-10-CM | POA: Diagnosis not present

## 2017-01-14 DIAGNOSIS — C786 Secondary malignant neoplasm of retroperitoneum and peritoneum: Secondary | ICD-10-CM | POA: Diagnosis not present

## 2017-01-14 DIAGNOSIS — C787 Secondary malignant neoplasm of liver and intrahepatic bile duct: Secondary | ICD-10-CM | POA: Diagnosis not present

## 2017-01-14 DIAGNOSIS — R490 Dysphonia: Secondary | ICD-10-CM | POA: Diagnosis not present

## 2017-01-14 DIAGNOSIS — K219 Gastro-esophageal reflux disease without esophagitis: Secondary | ICD-10-CM | POA: Diagnosis not present

## 2017-01-14 DIAGNOSIS — J069 Acute upper respiratory infection, unspecified: Secondary | ICD-10-CM | POA: Diagnosis not present

## 2017-01-14 DIAGNOSIS — C7802 Secondary malignant neoplasm of left lung: Secondary | ICD-10-CM | POA: Diagnosis not present

## 2017-01-16 DIAGNOSIS — C189 Malignant neoplasm of colon, unspecified: Secondary | ICD-10-CM | POA: Diagnosis not present

## 2017-01-28 DIAGNOSIS — R110 Nausea: Secondary | ICD-10-CM | POA: Diagnosis not present

## 2017-01-28 DIAGNOSIS — T451X5A Adverse effect of antineoplastic and immunosuppressive drugs, initial encounter: Secondary | ICD-10-CM | POA: Diagnosis not present

## 2017-01-28 DIAGNOSIS — E876 Hypokalemia: Secondary | ICD-10-CM | POA: Diagnosis not present

## 2017-01-28 DIAGNOSIS — L270 Generalized skin eruption due to drugs and medicaments taken internally: Secondary | ICD-10-CM | POA: Diagnosis not present

## 2017-01-28 DIAGNOSIS — C7802 Secondary malignant neoplasm of left lung: Secondary | ICD-10-CM | POA: Diagnosis not present

## 2017-01-28 DIAGNOSIS — C787 Secondary malignant neoplasm of liver and intrahepatic bile duct: Secondary | ICD-10-CM | POA: Diagnosis not present

## 2017-01-28 DIAGNOSIS — K219 Gastro-esophageal reflux disease without esophagitis: Secondary | ICD-10-CM | POA: Diagnosis not present

## 2017-01-28 DIAGNOSIS — C189 Malignant neoplasm of colon, unspecified: Secondary | ICD-10-CM | POA: Diagnosis not present

## 2017-01-28 DIAGNOSIS — C182 Malignant neoplasm of ascending colon: Secondary | ICD-10-CM | POA: Diagnosis not present

## 2017-01-28 DIAGNOSIS — R600 Localized edema: Secondary | ICD-10-CM | POA: Diagnosis not present

## 2017-01-28 DIAGNOSIS — C786 Secondary malignant neoplasm of retroperitoneum and peritoneum: Secondary | ICD-10-CM | POA: Diagnosis not present

## 2017-01-28 DIAGNOSIS — Z9049 Acquired absence of other specified parts of digestive tract: Secondary | ICD-10-CM | POA: Diagnosis not present

## 2017-01-28 DIAGNOSIS — R197 Diarrhea, unspecified: Secondary | ICD-10-CM | POA: Diagnosis not present

## 2017-04-08 DIAGNOSIS — C189 Malignant neoplasm of colon, unspecified: Secondary | ICD-10-CM | POA: Diagnosis not present

## 2017-04-22 DIAGNOSIS — C189 Malignant neoplasm of colon, unspecified: Secondary | ICD-10-CM | POA: Diagnosis not present

## 2017-06-10 DIAGNOSIS — C189 Malignant neoplasm of colon, unspecified: Secondary | ICD-10-CM | POA: Diagnosis not present

## 2017-06-12 DIAGNOSIS — C189 Malignant neoplasm of colon, unspecified: Secondary | ICD-10-CM | POA: Diagnosis not present

## 2017-06-24 DIAGNOSIS — C189 Malignant neoplasm of colon, unspecified: Secondary | ICD-10-CM | POA: Diagnosis not present

## 2017-06-26 DIAGNOSIS — C189 Malignant neoplasm of colon, unspecified: Secondary | ICD-10-CM | POA: Diagnosis not present

## 2017-07-04 ENCOUNTER — Emergency Department (HOSPITAL_COMMUNITY)
Admission: EM | Admit: 2017-07-04 | Discharge: 2017-07-04 | Disposition: A | Discharge status: Home / Self Care | Payer: BLUE CROSS/BLUE SHIELD | Attending: Emergency Medicine | Admitting: Emergency Medicine

## 2017-07-04 ENCOUNTER — Encounter (HOSPITAL_COMMUNITY): Payer: Self-pay | Admitting: Emergency Medicine

## 2017-07-04 DIAGNOSIS — R1084 Generalized abdominal pain: Secondary | ICD-10-CM | POA: Insufficient documentation

## 2017-07-04 DIAGNOSIS — C189 Malignant neoplasm of colon, unspecified: Secondary | ICD-10-CM | POA: Diagnosis not present

## 2017-07-04 DIAGNOSIS — C787 Secondary malignant neoplasm of liver and intrahepatic bile duct: Secondary | ICD-10-CM | POA: Diagnosis not present

## 2017-07-04 LAB — COMPREHENSIVE METABOLIC PANEL
ALT: 145 U/L — AB (ref 17–63)
AST: 200 U/L — ABNORMAL HIGH (ref 15–41)
Albumin: 3.3 g/dL — ABNORMAL LOW (ref 3.5–5.0)
Alkaline Phosphatase: 126 U/L (ref 38–126)
Anion gap: 9 (ref 5–15)
BUN: 6 mg/dL (ref 6–20)
CALCIUM: 9.7 mg/dL (ref 8.9–10.3)
CHLORIDE: 103 mmol/L (ref 101–111)
CO2: 27 mmol/L (ref 22–32)
CREATININE: 1.08 mg/dL (ref 0.61–1.24)
Glucose, Bld: 177 mg/dL — ABNORMAL HIGH (ref 65–99)
Potassium: 3.9 mmol/L (ref 3.5–5.1)
Sodium: 139 mmol/L (ref 135–145)
Total Bilirubin: 2.2 mg/dL — ABNORMAL HIGH (ref 0.3–1.2)
Total Protein: 6.2 g/dL — ABNORMAL LOW (ref 6.5–8.1)

## 2017-07-04 LAB — CBC
HCT: 46.6 % (ref 39.0–52.0)
Hemoglobin: 15.7 g/dL (ref 13.0–17.0)
MCH: 29.8 pg (ref 26.0–34.0)
MCHC: 33.7 g/dL (ref 30.0–36.0)
MCV: 88.4 fL (ref 78.0–100.0)
PLATELETS: 84 10*3/uL — AB (ref 150–400)
RBC: 5.27 MIL/uL (ref 4.22–5.81)
RDW: 16.5 % — ABNORMAL HIGH (ref 11.5–15.5)
WBC: 7 10*3/uL (ref 4.0–10.5)

## 2017-07-04 LAB — LIPASE, BLOOD: LIPASE: 59 U/L — AB (ref 11–51)

## 2017-07-04 LAB — URINALYSIS, ROUTINE W REFLEX MICROSCOPIC
Bilirubin Urine: NEGATIVE
Glucose, UA: 150 mg/dL — AB
Hgb urine dipstick: NEGATIVE
Ketones, ur: NEGATIVE mg/dL
LEUKOCYTES UA: NEGATIVE
NITRITE: NEGATIVE
PROTEIN: NEGATIVE mg/dL
Specific Gravity, Urine: 1.012 (ref 1.005–1.030)
pH: 7 (ref 5.0–8.0)

## 2017-07-04 NOTE — ED Triage Notes (Signed)
Pt presents to ER with generalized abd pain that began last night with n/v; regular BMs; pt also reports currently undergoing chemo every 2 wks for metastatic colon cancer

## 2017-07-04 NOTE — Discharge Instructions (Signed)

## 2017-07-04 NOTE — ED Notes (Signed)
Pt given specimen cup. 

## 2017-07-04 NOTE — ED Provider Notes (Signed)
[facility] DEPT Provider Note   CSN: [id number] Arrival date & time: 07/04/17  0258     History   Chief Complaint Chief Complaint  Patient presents with  . Abdominal Pain    HPI Benjamin Blake is a 66 y.o. male.  Patient with history of colon CA, metastatic to liver, presents with onset severe abdominal pain associated with nausea, starting last night. No diarrhea or constipation. He is currently undergoing chemotherapy for cancer treatment. No fever. He reports he induced vomiting to try to relieve the pain but only produced bile appearing emesis that was non-bloody. No urinary symptoms, SOB, chest pain. He states that just prior to being placedi n a room in the ED he belched and the pain resolved afterward.   The history is provided by the patient. No language interpreter was used.    Past Medical History:  Diagnosis Date  . Cancer ([place])   . Coronary artery calcification seen on CAT scan 03/08/2015    Patient Active Problem List   Diagnosis Date Noted  . Abnormal ECG 03/08/2015  . Coronary artery calcification seen on CAT scan 03/08/2015  . Drug-induced hypotension 03/08/2015  . A-fib ([place]) 02/23/2015  . Nausea with vomiting 02/18/2015  . Colon cancer, metastatic 02/18/2015  . Acute respiratory failure ([place]) 02/18/2015  . Aspiration into airway 02/18/2015  . Abdominal pain   . AKI (acute kidney injury) ([place]) 02/16/2015  . Hypokalemia 02/16/2015  . Hyponatremia 02/16/2015  . Chemotherapy-induced vomiting 02/16/2015  . Acute kidney injury ([place]) 02/16/2015  . Acute gastroenteritis     Past Surgical History:  Procedure Laterality Date  . COLON SURGERY         Home Medications    Prior to Admission medications   Medication Sig Start Date End Date Taking? Authorizing Provider  Cetirizine HCl 10 MG CAPS Take 10 mg by mouth daily.   Yes [provider]  rosuvastatin (CRESTOR) 10 MG tablet Take 10 mg by mouth daily. 09/08/14  Yes [provider]    aspirin EC 81 MG tablet Take 1 tablet (81 mg total) by mouth daily. Patient not taking: Reported on 07/04/2017 03/06/15   Croitoru, Dani Gobble, MD  ciprofloxacin (CIPRO) 250 MG tablet Take 1 tablet (250 mg total) by mouth 2 (two) times daily. Patient not taking: Reported on 07/04/2017 11/01/15   Vivi Barrack, MD  ondansetron (ZOFRAN) 8 MG tablet Take 1 tablet (8 mg total) by mouth every 8 (eight) hours as needed for nausea or vomiting. Patient not taking: Reported on 07/04/2017 02/27/15   Thurnell Lose, MD    Family History Family History  Problem Relation Age of Onset  . Cancer - Colon Mother   . Heart disease Father     Social History Social History  Substance Use Topics  . Smoking status: Never Smoker  . Smokeless tobacco: Not on file  . Alcohol use Yes     Comment: Social drinker     Allergies   Patient has no known allergies.   Review of Systems Review of Systems  Constitutional: Negative for chills and fever.  HENT: Negative.   Respiratory: Negative.   Cardiovascular: Negative.   Gastrointestinal: Positive for abdominal pain and nausea. Negative for constipation and diarrhea.  Musculoskeletal: Negative.  Negative for back pain.  Skin: Negative.   Neurological: Negative.      Physical Exam Updated Vital Signs BP (!) 152/90 (BP Location: Right Arm)   Pulse 66   Temp 97.8 F (36.6 C) (Oral)  Resp 16   Ht 5\' 7"  (1.702 m)   Wt 79.4 kg (175 lb)   SpO2 96%   BMI 27.41 kg/m   Physical Exam  Constitutional: He is oriented to person, place, and time. He appears well-developed and well-nourished.  HENT:  Head: Normocephalic.  Neck: Normal range of motion. Neck supple.  Cardiovascular: Normal rate and regular rhythm.   Pulmonary/Chest: Effort normal and breath sounds normal. He has no wheezes. He has no rales.  Abdominal: Soft. Bowel sounds are normal. He exhibits no distension. There is no tenderness. There is no rebound and no guarding.  No abdominal  tenderness to light and deep palpation.  Musculoskeletal: Normal range of motion.  Neurological: He is alert and oriented to person, place, and time.  Skin: Skin is warm and dry. No rash noted.  Psychiatric: He has a normal mood and affect.     ED Treatments / Results  Labs (all labs ordered are listed, but only abnormal results are displayed) Labs Reviewed  LIPASE, BLOOD - Abnormal; Notable for the following:       Result Value   Lipase 59 (*)    All other components within normal limits  COMPREHENSIVE METABOLIC PANEL - Abnormal; Notable for the following:    Glucose, Bld 177 (*)    Total Protein 6.2 (*)    Albumin 3.3 (*)    AST 200 (*)    ALT 145 (*)    Total Bilirubin 2.2 (*)    All other components within normal limits  CBC - Abnormal; Notable for the following:    RDW 16.5 (*)    Platelets 84 (*)    All other components within normal limits  URINALYSIS, ROUTINE W REFLEX MICROSCOPIC - Abnormal; Notable for the following:    Glucose, UA 150 (*)    All other components within normal limits   Results for orders placed or performed during the hospital encounter of 07/04/17  Lipase, blood  Result Value Ref Range   Lipase 59 (H) 11 - 51 U/L  Comprehensive metabolic panel  Result Value Ref Range   Sodium 139 135 - 145 mmol/L   Potassium 3.9 3.5 - 5.1 mmol/L   Chloride 103 101 - 111 mmol/L   CO2 27 22 - 32 mmol/L   Glucose, Bld 177 (H) 65 - 99 mg/dL   BUN 6 6 - 20 mg/dL   Creatinine, Ser 1.08 0.61 - 1.24 mg/dL   Calcium 9.7 8.9 - 10.3 mg/dL   Total Protein 6.2 (L) 6.5 - 8.1 g/dL   Albumin 3.3 (L) 3.5 - 5.0 g/dL   AST 200 (H) 15 - 41 U/L   ALT 145 (H) 17 - 63 U/L   Alkaline Phosphatase 126 38 - 126 U/L   Total Bilirubin 2.2 (H) 0.3 - 1.2 mg/dL   GFR calc non Af Amer >60 >60 mL/min   GFR calc Af Amer >60 >60 mL/min   Anion gap 9 5 - 15  CBC  Result Value Ref Range   WBC 7.0 4.0 - 10.5 K/uL   RBC 5.27 4.22 - 5.81 MIL/uL   Hemoglobin 15.7 13.0 - 17.0 g/dL   HCT  46.6 39.0 - 52.0 %   MCV 88.4 78.0 - 100.0 fL   MCH 29.8 26.0 - 34.0 pg   MCHC 33.7 30.0 - 36.0 g/dL   RDW 16.5 (H) 11.5 - 15.5 %   Platelets 84 (L) 150 - 400 K/uL  Urinalysis, Routine w reflex microscopic  Result Value  Ref Range   Color, Urine YELLOW YELLOW   APPearance CLEAR CLEAR   Specific Gravity, Urine 1.012 1.005 - 1.030   pH 7.0 5.0 - 8.0   Glucose, UA 150 (A) NEGATIVE mg/dL   Hgb urine dipstick NEGATIVE NEGATIVE   Bilirubin Urine NEGATIVE NEGATIVE   Ketones, ur NEGATIVE NEGATIVE mg/dL   Protein, ur NEGATIVE NEGATIVE mg/dL   Nitrite NEGATIVE NEGATIVE   Leukocytes, UA NEGATIVE NEGATIVE    EKG  EKG Interpretation None       Radiology No results found.  Procedures Procedures (including critical care time)  Medications Ordered in ED Medications - No data to display   Initial Impression / Assessment and Plan / ED Course  I have reviewed the triage vital signs and the nursing notes.  Pertinent labs & imaging results that were available during my care of the patient were reviewed by me and considered in my medical decision making (see chart for details).     Patient comes to ED for evaluation of abdominal pain that was sudden in onset last night and relieved after belching in the ED. He remains asymptomatic even after period of observation and PO fluids.   He is examined by Dr. Christy Gentles and found appropriate for discharge home.   Final Clinical Impressions(s) / ED Diagnoses   Final diagnoses:  Generalized abdominal pain    New Prescriptions New Prescriptions   No medications on file     Charlann Lange, Hershal Coria 07/04/17 5868    Ripley Fraise, MD 07/05/17 [phone]

## 2017-07-04 NOTE — ED Provider Notes (Signed)
Patient seen/examined in the Emergency Department in conjunction with Midlevel Provider [place] Patient reports abdominal pain, now resolved Exam : awake/alert, no distress, smiling, well appearing, no focal abd tenderness Plan: d/c home, pt does not want further workup, we discussed strict ER return precautions     Benjamin Fraise, MD 07/04/17 [phone]

## 2017-07-04 NOTE — ED Notes (Signed)
Patient left at this time with all belongings. 

## 2017-07-04 NOTE — ED Notes (Signed)
MD at bedside. 

## 2017-07-08 DIAGNOSIS — C182 Malignant neoplasm of ascending colon: Secondary | ICD-10-CM | POA: Diagnosis not present

## 2017-07-08 DIAGNOSIS — Z9049 Acquired absence of other specified parts of digestive tract: Secondary | ICD-10-CM | POA: Diagnosis not present

## 2017-07-08 DIAGNOSIS — Z5112 Encounter for antineoplastic immunotherapy: Secondary | ICD-10-CM | POA: Diagnosis not present

## 2017-07-08 DIAGNOSIS — C787 Secondary malignant neoplasm of liver and intrahepatic bile duct: Secondary | ICD-10-CM | POA: Diagnosis not present

## 2017-07-08 DIAGNOSIS — K130 Diseases of lips: Secondary | ICD-10-CM | POA: Diagnosis not present

## 2017-07-08 DIAGNOSIS — C189 Malignant neoplasm of colon, unspecified: Secondary | ICD-10-CM | POA: Diagnosis not present

## 2017-07-08 DIAGNOSIS — G629 Polyneuropathy, unspecified: Secondary | ICD-10-CM | POA: Diagnosis not present

## 2017-07-08 DIAGNOSIS — R5383 Other fatigue: Secondary | ICD-10-CM | POA: Diagnosis not present

## 2017-07-08 DIAGNOSIS — C7802 Secondary malignant neoplasm of left lung: Secondary | ICD-10-CM | POA: Diagnosis not present

## 2017-07-08 DIAGNOSIS — H9190 Unspecified hearing loss, unspecified ear: Secondary | ICD-10-CM | POA: Diagnosis not present

## 2017-07-10 DIAGNOSIS — C189 Malignant neoplasm of colon, unspecified: Secondary | ICD-10-CM | POA: Diagnosis not present

## 2017-07-22 DIAGNOSIS — C786 Secondary malignant neoplasm of retroperitoneum and peritoneum: Secondary | ICD-10-CM | POA: Diagnosis not present

## 2017-07-22 DIAGNOSIS — Z9622 Myringotomy tube(s) status: Secondary | ICD-10-CM | POA: Diagnosis not present

## 2017-07-22 DIAGNOSIS — Z5112 Encounter for antineoplastic immunotherapy: Secondary | ICD-10-CM | POA: Diagnosis not present

## 2017-07-22 DIAGNOSIS — C7802 Secondary malignant neoplasm of left lung: Secondary | ICD-10-CM | POA: Diagnosis not present

## 2017-07-22 DIAGNOSIS — Z9049 Acquired absence of other specified parts of digestive tract: Secondary | ICD-10-CM | POA: Diagnosis not present

## 2017-07-22 DIAGNOSIS — C182 Malignant neoplasm of ascending colon: Secondary | ICD-10-CM | POA: Diagnosis not present

## 2017-07-22 DIAGNOSIS — R51 Headache: Secondary | ICD-10-CM | POA: Diagnosis not present

## 2017-07-22 DIAGNOSIS — C189 Malignant neoplasm of colon, unspecified: Secondary | ICD-10-CM | POA: Diagnosis not present

## 2017-07-22 DIAGNOSIS — C787 Secondary malignant neoplasm of liver and intrahepatic bile duct: Secondary | ICD-10-CM | POA: Diagnosis not present

## 2017-07-23 DIAGNOSIS — H6523 Chronic serous otitis media, bilateral: Secondary | ICD-10-CM | POA: Diagnosis not present

## 2017-07-23 DIAGNOSIS — H6983 Other specified disorders of Eustachian tube, bilateral: Secondary | ICD-10-CM | POA: Diagnosis not present

## 2017-07-24 DIAGNOSIS — C189 Malignant neoplasm of colon, unspecified: Secondary | ICD-10-CM | POA: Diagnosis not present

## 2017-08-10 DIAGNOSIS — H25811 Combined forms of age-related cataract, right eye: Secondary | ICD-10-CM | POA: Diagnosis not present

## 2017-08-10 DIAGNOSIS — H2511 Age-related nuclear cataract, right eye: Secondary | ICD-10-CM | POA: Diagnosis not present

## 2017-08-10 DIAGNOSIS — H25041 Posterior subcapsular polar age-related cataract, right eye: Secondary | ICD-10-CM | POA: Diagnosis not present

## 2017-08-12 DIAGNOSIS — C19 Malignant neoplasm of rectosigmoid junction: Secondary | ICD-10-CM | POA: Diagnosis not present

## 2017-08-12 DIAGNOSIS — C7802 Secondary malignant neoplasm of left lung: Secondary | ICD-10-CM | POA: Diagnosis not present

## 2017-08-12 DIAGNOSIS — Z9049 Acquired absence of other specified parts of digestive tract: Secondary | ICD-10-CM | POA: Diagnosis not present

## 2017-08-12 DIAGNOSIS — R5383 Other fatigue: Secondary | ICD-10-CM | POA: Diagnosis not present

## 2017-08-12 DIAGNOSIS — C786 Secondary malignant neoplasm of retroperitoneum and peritoneum: Secondary | ICD-10-CM | POA: Diagnosis not present

## 2017-08-12 DIAGNOSIS — Z5112 Encounter for antineoplastic immunotherapy: Secondary | ICD-10-CM | POA: Diagnosis not present

## 2017-08-12 DIAGNOSIS — R51 Headache: Secondary | ICD-10-CM | POA: Diagnosis not present

## 2017-08-12 DIAGNOSIS — C787 Secondary malignant neoplasm of liver and intrahepatic bile duct: Secondary | ICD-10-CM | POA: Diagnosis not present

## 2017-08-12 DIAGNOSIS — Z9622 Myringotomy tube(s) status: Secondary | ICD-10-CM | POA: Diagnosis not present

## 2017-08-12 DIAGNOSIS — C189 Malignant neoplasm of colon, unspecified: Secondary | ICD-10-CM | POA: Diagnosis not present

## 2017-08-12 DIAGNOSIS — G629 Polyneuropathy, unspecified: Secondary | ICD-10-CM | POA: Diagnosis not present

## 2017-08-14 DIAGNOSIS — C189 Malignant neoplasm of colon, unspecified: Secondary | ICD-10-CM | POA: Diagnosis not present

## 2017-08-20 DIAGNOSIS — Z0000 Encounter for general adult medical examination without abnormal findings: Secondary | ICD-10-CM | POA: Diagnosis not present

## 2017-08-20 DIAGNOSIS — Z23 Encounter for immunization: Secondary | ICD-10-CM | POA: Diagnosis not present

## 2017-08-20 DIAGNOSIS — E782 Mixed hyperlipidemia: Secondary | ICD-10-CM | POA: Diagnosis not present

## 2017-08-20 DIAGNOSIS — C189 Malignant neoplasm of colon, unspecified: Secondary | ICD-10-CM | POA: Diagnosis not present

## 2017-08-26 DIAGNOSIS — R918 Other nonspecific abnormal finding of lung field: Secondary | ICD-10-CM | POA: Diagnosis not present

## 2017-08-26 DIAGNOSIS — C787 Secondary malignant neoplasm of liver and intrahepatic bile duct: Secondary | ICD-10-CM | POA: Diagnosis not present

## 2017-08-26 DIAGNOSIS — C189 Malignant neoplasm of colon, unspecified: Secondary | ICD-10-CM | POA: Diagnosis not present

## 2017-08-26 DIAGNOSIS — Z5112 Encounter for antineoplastic immunotherapy: Secondary | ICD-10-CM | POA: Diagnosis not present

## 2017-08-26 DIAGNOSIS — C19 Malignant neoplasm of rectosigmoid junction: Secondary | ICD-10-CM | POA: Diagnosis not present

## 2017-08-26 DIAGNOSIS — Z9049 Acquired absence of other specified parts of digestive tract: Secondary | ICD-10-CM | POA: Diagnosis not present

## 2017-08-26 DIAGNOSIS — G629 Polyneuropathy, unspecified: Secondary | ICD-10-CM | POA: Diagnosis not present

## 2017-08-26 DIAGNOSIS — R51 Headache: Secondary | ICD-10-CM | POA: Diagnosis not present

## 2017-08-26 DIAGNOSIS — Z5111 Encounter for antineoplastic chemotherapy: Secondary | ICD-10-CM | POA: Diagnosis not present

## 2017-08-26 DIAGNOSIS — C7802 Secondary malignant neoplasm of left lung: Secondary | ICD-10-CM | POA: Diagnosis not present

## 2017-08-28 DIAGNOSIS — C189 Malignant neoplasm of colon, unspecified: Secondary | ICD-10-CM | POA: Diagnosis not present

## 2017-09-07 DIAGNOSIS — H25042 Posterior subcapsular polar age-related cataract, left eye: Secondary | ICD-10-CM | POA: Diagnosis not present

## 2017-09-07 DIAGNOSIS — H2512 Age-related nuclear cataract, left eye: Secondary | ICD-10-CM | POA: Diagnosis not present

## 2017-09-07 DIAGNOSIS — H25812 Combined forms of age-related cataract, left eye: Secondary | ICD-10-CM | POA: Diagnosis not present

## 2017-09-09 DIAGNOSIS — Z9849 Cataract extraction status, unspecified eye: Secondary | ICD-10-CM | POA: Diagnosis not present

## 2017-09-09 DIAGNOSIS — C7802 Secondary malignant neoplasm of left lung: Secondary | ICD-10-CM | POA: Diagnosis not present

## 2017-09-09 DIAGNOSIS — C182 Malignant neoplasm of ascending colon: Secondary | ICD-10-CM | POA: Diagnosis not present

## 2017-09-09 DIAGNOSIS — C786 Secondary malignant neoplasm of retroperitoneum and peritoneum: Secondary | ICD-10-CM | POA: Diagnosis not present

## 2017-09-09 DIAGNOSIS — R51 Headache: Secondary | ICD-10-CM | POA: Diagnosis not present

## 2017-09-09 DIAGNOSIS — C189 Malignant neoplasm of colon, unspecified: Secondary | ICD-10-CM | POA: Diagnosis not present

## 2017-09-09 DIAGNOSIS — C787 Secondary malignant neoplasm of liver and intrahepatic bile duct: Secondary | ICD-10-CM | POA: Diagnosis not present

## 2017-09-09 DIAGNOSIS — Z5112 Encounter for antineoplastic immunotherapy: Secondary | ICD-10-CM | POA: Diagnosis not present

## 2017-09-09 DIAGNOSIS — Z9049 Acquired absence of other specified parts of digestive tract: Secondary | ICD-10-CM | POA: Diagnosis not present

## 2017-09-11 DIAGNOSIS — C189 Malignant neoplasm of colon, unspecified: Secondary | ICD-10-CM | POA: Diagnosis not present

## 2017-09-23 DIAGNOSIS — Z9049 Acquired absence of other specified parts of digestive tract: Secondary | ICD-10-CM | POA: Diagnosis not present

## 2017-09-23 DIAGNOSIS — Z5111 Encounter for antineoplastic chemotherapy: Secondary | ICD-10-CM | POA: Diagnosis not present

## 2017-09-23 DIAGNOSIS — C7801 Secondary malignant neoplasm of right lung: Secondary | ICD-10-CM | POA: Diagnosis not present

## 2017-09-23 DIAGNOSIS — C7802 Secondary malignant neoplasm of left lung: Secondary | ICD-10-CM | POA: Diagnosis not present

## 2017-09-23 DIAGNOSIS — C182 Malignant neoplasm of ascending colon: Secondary | ICD-10-CM | POA: Diagnosis not present

## 2017-09-23 DIAGNOSIS — C786 Secondary malignant neoplasm of retroperitoneum and peritoneum: Secondary | ICD-10-CM | POA: Diagnosis not present

## 2017-09-23 DIAGNOSIS — C189 Malignant neoplasm of colon, unspecified: Secondary | ICD-10-CM | POA: Diagnosis not present

## 2017-09-23 DIAGNOSIS — C787 Secondary malignant neoplasm of liver and intrahepatic bile duct: Secondary | ICD-10-CM | POA: Diagnosis not present

## 2017-09-23 DIAGNOSIS — Z5112 Encounter for antineoplastic immunotherapy: Secondary | ICD-10-CM | POA: Diagnosis not present

## 2017-09-25 DIAGNOSIS — C189 Malignant neoplasm of colon, unspecified: Secondary | ICD-10-CM | POA: Diagnosis not present

## 2017-10-07 DIAGNOSIS — C7801 Secondary malignant neoplasm of right lung: Secondary | ICD-10-CM | POA: Diagnosis not present

## 2017-10-07 DIAGNOSIS — C787 Secondary malignant neoplasm of liver and intrahepatic bile duct: Secondary | ICD-10-CM | POA: Diagnosis not present

## 2017-10-07 DIAGNOSIS — Z5111 Encounter for antineoplastic chemotherapy: Secondary | ICD-10-CM | POA: Diagnosis not present

## 2017-10-07 DIAGNOSIS — C189 Malignant neoplasm of colon, unspecified: Secondary | ICD-10-CM | POA: Diagnosis not present

## 2017-10-07 DIAGNOSIS — Z5112 Encounter for antineoplastic immunotherapy: Secondary | ICD-10-CM | POA: Diagnosis not present

## 2017-10-07 DIAGNOSIS — D696 Thrombocytopenia, unspecified: Secondary | ICD-10-CM | POA: Diagnosis not present

## 2017-10-07 DIAGNOSIS — C7802 Secondary malignant neoplasm of left lung: Secondary | ICD-10-CM | POA: Diagnosis not present

## 2017-10-07 DIAGNOSIS — Z9049 Acquired absence of other specified parts of digestive tract: Secondary | ICD-10-CM | POA: Diagnosis not present

## 2017-10-07 DIAGNOSIS — G629 Polyneuropathy, unspecified: Secondary | ICD-10-CM | POA: Diagnosis not present

## 2017-10-09 DIAGNOSIS — C189 Malignant neoplasm of colon, unspecified: Secondary | ICD-10-CM | POA: Diagnosis not present

## 2017-10-18 DIAGNOSIS — H35711 Central serous chorioretinopathy, right eye: Secondary | ICD-10-CM | POA: Diagnosis not present

## 2017-10-21 DIAGNOSIS — C189 Malignant neoplasm of colon, unspecified: Secondary | ICD-10-CM | POA: Diagnosis not present

## 2017-10-21 DIAGNOSIS — C787 Secondary malignant neoplasm of liver and intrahepatic bile duct: Secondary | ICD-10-CM | POA: Diagnosis not present

## 2017-10-21 DIAGNOSIS — Z5111 Encounter for antineoplastic chemotherapy: Secondary | ICD-10-CM | POA: Diagnosis not present

## 2017-10-21 DIAGNOSIS — Z9049 Acquired absence of other specified parts of digestive tract: Secondary | ICD-10-CM | POA: Diagnosis not present

## 2017-10-21 DIAGNOSIS — Z5112 Encounter for antineoplastic immunotherapy: Secondary | ICD-10-CM | POA: Diagnosis not present

## 2017-10-21 DIAGNOSIS — C7802 Secondary malignant neoplasm of left lung: Secondary | ICD-10-CM | POA: Diagnosis not present

## 2017-10-21 DIAGNOSIS — D696 Thrombocytopenia, unspecified: Secondary | ICD-10-CM | POA: Diagnosis not present

## 2017-10-21 DIAGNOSIS — C481 Malignant neoplasm of specified parts of peritoneum: Secondary | ICD-10-CM | POA: Diagnosis not present

## 2017-10-23 DIAGNOSIS — C189 Malignant neoplasm of colon, unspecified: Secondary | ICD-10-CM | POA: Diagnosis not present

## 2017-11-01 DIAGNOSIS — R040 Epistaxis: Secondary | ICD-10-CM | POA: Diagnosis not present

## 2017-11-01 DIAGNOSIS — H6983 Other specified disorders of Eustachian tube, bilateral: Secondary | ICD-10-CM | POA: Diagnosis not present

## 2017-11-03 DIAGNOSIS — C189 Malignant neoplasm of colon, unspecified: Secondary | ICD-10-CM | POA: Diagnosis not present

## 2017-11-03 DIAGNOSIS — C787 Secondary malignant neoplasm of liver and intrahepatic bile duct: Secondary | ICD-10-CM | POA: Diagnosis not present

## 2017-11-03 DIAGNOSIS — R911 Solitary pulmonary nodule: Secondary | ICD-10-CM | POA: Diagnosis not present

## 2017-11-03 DIAGNOSIS — C7800 Secondary malignant neoplasm of unspecified lung: Secondary | ICD-10-CM | POA: Diagnosis not present

## 2017-11-03 DIAGNOSIS — K8020 Calculus of gallbladder without cholecystitis without obstruction: Secondary | ICD-10-CM | POA: Diagnosis not present

## 2017-11-04 DIAGNOSIS — R630 Anorexia: Secondary | ICD-10-CM | POA: Diagnosis not present

## 2017-11-04 DIAGNOSIS — C189 Malignant neoplasm of colon, unspecified: Secondary | ICD-10-CM | POA: Diagnosis not present

## 2017-11-04 DIAGNOSIS — R51 Headache: Secondary | ICD-10-CM | POA: Diagnosis not present

## 2017-11-04 DIAGNOSIS — R945 Abnormal results of liver function studies: Secondary | ICD-10-CM | POA: Diagnosis not present

## 2017-11-04 DIAGNOSIS — C182 Malignant neoplasm of ascending colon: Secondary | ICD-10-CM | POA: Diagnosis not present

## 2017-11-04 DIAGNOSIS — G629 Polyneuropathy, unspecified: Secondary | ICD-10-CM | POA: Diagnosis not present

## 2017-11-04 DIAGNOSIS — C786 Secondary malignant neoplasm of retroperitoneum and peritoneum: Secondary | ICD-10-CM | POA: Diagnosis not present

## 2017-11-04 DIAGNOSIS — Z6827 Body mass index (BMI) 27.0-27.9, adult: Secondary | ICD-10-CM | POA: Diagnosis not present

## 2017-11-04 DIAGNOSIS — R05 Cough: Secondary | ICD-10-CM | POA: Diagnosis not present

## 2017-11-04 DIAGNOSIS — J3489 Other specified disorders of nose and nasal sinuses: Secondary | ICD-10-CM | POA: Diagnosis not present

## 2017-11-04 DIAGNOSIS — C7802 Secondary malignant neoplasm of left lung: Secondary | ICD-10-CM | POA: Diagnosis not present

## 2017-11-04 DIAGNOSIS — R198 Other specified symptoms and signs involving the digestive system and abdomen: Secondary | ICD-10-CM | POA: Diagnosis not present

## 2017-11-04 DIAGNOSIS — C787 Secondary malignant neoplasm of liver and intrahepatic bile duct: Secondary | ICD-10-CM | POA: Diagnosis not present

## 2017-11-04 DIAGNOSIS — Z9049 Acquired absence of other specified parts of digestive tract: Secondary | ICD-10-CM | POA: Diagnosis not present

## 2017-11-04 DIAGNOSIS — J069 Acute upper respiratory infection, unspecified: Secondary | ICD-10-CM | POA: Diagnosis not present

## 2017-11-04 DIAGNOSIS — R7989 Other specified abnormal findings of blood chemistry: Secondary | ICD-10-CM | POA: Diagnosis not present

## 2017-11-25 DIAGNOSIS — C787 Secondary malignant neoplasm of liver and intrahepatic bile duct: Secondary | ICD-10-CM | POA: Diagnosis not present

## 2017-11-25 DIAGNOSIS — G629 Polyneuropathy, unspecified: Secondary | ICD-10-CM | POA: Diagnosis not present

## 2017-11-25 DIAGNOSIS — C189 Malignant neoplasm of colon, unspecified: Secondary | ICD-10-CM | POA: Diagnosis not present

## 2017-11-25 DIAGNOSIS — R0981 Nasal congestion: Secondary | ICD-10-CM | POA: Diagnosis not present

## 2017-11-25 DIAGNOSIS — R51 Headache: Secondary | ICD-10-CM | POA: Diagnosis not present

## 2017-11-25 DIAGNOSIS — M546 Pain in thoracic spine: Secondary | ICD-10-CM | POA: Diagnosis not present

## 2017-11-25 DIAGNOSIS — C7802 Secondary malignant neoplasm of left lung: Secondary | ICD-10-CM | POA: Diagnosis not present

## 2017-11-25 DIAGNOSIS — R05 Cough: Secondary | ICD-10-CM | POA: Diagnosis not present

## 2017-11-25 DIAGNOSIS — R7989 Other specified abnormal findings of blood chemistry: Secondary | ICD-10-CM | POA: Diagnosis not present

## 2017-11-25 DIAGNOSIS — J069 Acute upper respiratory infection, unspecified: Secondary | ICD-10-CM | POA: Diagnosis not present

## 2017-11-25 DIAGNOSIS — Z9049 Acquired absence of other specified parts of digestive tract: Secondary | ICD-10-CM | POA: Diagnosis not present

## 2017-11-25 DIAGNOSIS — C786 Secondary malignant neoplasm of retroperitoneum and peritoneum: Secondary | ICD-10-CM | POA: Diagnosis not present

## 2017-12-02 DIAGNOSIS — R0981 Nasal congestion: Secondary | ICD-10-CM | POA: Diagnosis not present

## 2017-12-02 DIAGNOSIS — G629 Polyneuropathy, unspecified: Secondary | ICD-10-CM | POA: Diagnosis not present

## 2017-12-02 DIAGNOSIS — C189 Malignant neoplasm of colon, unspecified: Secondary | ICD-10-CM | POA: Diagnosis not present

## 2017-12-02 DIAGNOSIS — C7802 Secondary malignant neoplasm of left lung: Secondary | ICD-10-CM | POA: Diagnosis not present

## 2017-12-02 DIAGNOSIS — J069 Acute upper respiratory infection, unspecified: Secondary | ICD-10-CM | POA: Diagnosis not present

## 2017-12-02 DIAGNOSIS — R05 Cough: Secondary | ICD-10-CM | POA: Diagnosis not present

## 2017-12-02 DIAGNOSIS — Z79899 Other long term (current) drug therapy: Secondary | ICD-10-CM | POA: Diagnosis not present

## 2017-12-02 DIAGNOSIS — C182 Malignant neoplasm of ascending colon: Secondary | ICD-10-CM | POA: Diagnosis not present

## 2017-12-02 DIAGNOSIS — C787 Secondary malignant neoplasm of liver and intrahepatic bile duct: Secondary | ICD-10-CM | POA: Diagnosis not present

## 2017-12-02 DIAGNOSIS — M549 Dorsalgia, unspecified: Secondary | ICD-10-CM | POA: Diagnosis not present

## 2017-12-02 DIAGNOSIS — R7989 Other specified abnormal findings of blood chemistry: Secondary | ICD-10-CM | POA: Diagnosis not present

## 2017-12-16 DIAGNOSIS — Z5111 Encounter for antineoplastic chemotherapy: Secondary | ICD-10-CM | POA: Diagnosis not present

## 2017-12-16 DIAGNOSIS — Z9049 Acquired absence of other specified parts of digestive tract: Secondary | ICD-10-CM | POA: Diagnosis not present

## 2017-12-16 DIAGNOSIS — C189 Malignant neoplasm of colon, unspecified: Secondary | ICD-10-CM | POA: Diagnosis not present

## 2017-12-16 DIAGNOSIS — C182 Malignant neoplasm of ascending colon: Secondary | ICD-10-CM | POA: Diagnosis not present

## 2017-12-16 DIAGNOSIS — C787 Secondary malignant neoplasm of liver and intrahepatic bile duct: Secondary | ICD-10-CM | POA: Diagnosis not present

## 2017-12-16 DIAGNOSIS — C786 Secondary malignant neoplasm of retroperitoneum and peritoneum: Secondary | ICD-10-CM | POA: Diagnosis not present

## 2017-12-16 DIAGNOSIS — R945 Abnormal results of liver function studies: Secondary | ICD-10-CM | POA: Diagnosis not present

## 2017-12-16 DIAGNOSIS — C7802 Secondary malignant neoplasm of left lung: Secondary | ICD-10-CM | POA: Diagnosis not present

## 2017-12-16 DIAGNOSIS — R5383 Other fatigue: Secondary | ICD-10-CM | POA: Diagnosis not present

## 2017-12-16 DIAGNOSIS — G44209 Tension-type headache, unspecified, not intractable: Secondary | ICD-10-CM | POA: Diagnosis not present

## 2018-01-12 DIAGNOSIS — C7800 Secondary malignant neoplasm of unspecified lung: Secondary | ICD-10-CM | POA: Diagnosis not present

## 2018-01-12 DIAGNOSIS — R918 Other nonspecific abnormal finding of lung field: Secondary | ICD-10-CM | POA: Diagnosis not present

## 2018-01-12 DIAGNOSIS — C787 Secondary malignant neoplasm of liver and intrahepatic bile duct: Secondary | ICD-10-CM | POA: Diagnosis not present

## 2018-01-12 DIAGNOSIS — C189 Malignant neoplasm of colon, unspecified: Secondary | ICD-10-CM | POA: Diagnosis not present

## 2018-01-13 DIAGNOSIS — C786 Secondary malignant neoplasm of retroperitoneum and peritoneum: Secondary | ICD-10-CM | POA: Diagnosis not present

## 2018-01-13 DIAGNOSIS — G44209 Tension-type headache, unspecified, not intractable: Secondary | ICD-10-CM | POA: Diagnosis not present

## 2018-01-13 DIAGNOSIS — C189 Malignant neoplasm of colon, unspecified: Secondary | ICD-10-CM | POA: Diagnosis not present

## 2018-01-13 DIAGNOSIS — Z9049 Acquired absence of other specified parts of digestive tract: Secondary | ICD-10-CM | POA: Diagnosis not present

## 2018-01-13 DIAGNOSIS — R5383 Other fatigue: Secondary | ICD-10-CM | POA: Diagnosis not present

## 2018-01-13 DIAGNOSIS — C7802 Secondary malignant neoplasm of left lung: Secondary | ICD-10-CM | POA: Diagnosis not present

## 2018-01-13 DIAGNOSIS — G629 Polyneuropathy, unspecified: Secondary | ICD-10-CM | POA: Diagnosis not present

## 2018-01-13 DIAGNOSIS — C182 Malignant neoplasm of ascending colon: Secondary | ICD-10-CM | POA: Diagnosis not present

## 2018-01-13 DIAGNOSIS — R945 Abnormal results of liver function studies: Secondary | ICD-10-CM | POA: Diagnosis not present

## 2018-01-13 DIAGNOSIS — Z5111 Encounter for antineoplastic chemotherapy: Secondary | ICD-10-CM | POA: Diagnosis not present

## 2018-01-13 DIAGNOSIS — C787 Secondary malignant neoplasm of liver and intrahepatic bile duct: Secondary | ICD-10-CM | POA: Diagnosis not present

## 2018-01-21 ENCOUNTER — Inpatient Hospital Stay (HOSPITAL_COMMUNITY): Payer: BLUE CROSS/BLUE SHIELD

## 2018-01-21 ENCOUNTER — Encounter (HOSPITAL_COMMUNITY): Payer: Self-pay | Admitting: Emergency Medicine

## 2018-01-21 ENCOUNTER — Emergency Department (HOSPITAL_COMMUNITY): Payer: BLUE CROSS/BLUE SHIELD

## 2018-01-21 ENCOUNTER — Other Ambulatory Visit: Payer: Self-pay

## 2018-01-21 ENCOUNTER — Inpatient Hospital Stay (HOSPITAL_COMMUNITY)
Admission: EM | Admit: 2018-01-21 | Discharge: 2018-01-23 | DRG: 445 | Disposition: A | Discharge status: Home / Self Care | Payer: BLUE CROSS/BLUE SHIELD | Attending: Family Medicine | Admitting: Family Medicine

## 2018-01-21 DIAGNOSIS — C786 Secondary malignant neoplasm of retroperitoneum and peritoneum: Secondary | ICD-10-CM | POA: Diagnosis present

## 2018-01-21 DIAGNOSIS — C787 Secondary malignant neoplasm of liver and intrahepatic bile duct: Secondary | ICD-10-CM | POA: Diagnosis not present

## 2018-01-21 DIAGNOSIS — E785 Hyperlipidemia, unspecified: Secondary | ICD-10-CM | POA: Diagnosis not present

## 2018-01-21 DIAGNOSIS — K839 Disease of biliary tract, unspecified: Secondary | ICD-10-CM

## 2018-01-21 DIAGNOSIS — K8051 Calculus of bile duct without cholangitis or cholecystitis with obstruction: Secondary | ICD-10-CM | POA: Diagnosis not present

## 2018-01-21 DIAGNOSIS — R740 Nonspecific elevation of levels of transaminase and lactic acid dehydrogenase [LDH]: Secondary | ICD-10-CM | POA: Diagnosis not present

## 2018-01-21 DIAGNOSIS — K8020 Calculus of gallbladder without cholecystitis without obstruction: Secondary | ICD-10-CM | POA: Diagnosis not present

## 2018-01-21 DIAGNOSIS — R7401 Elevation of levels of liver transaminase levels: Secondary | ICD-10-CM

## 2018-01-21 DIAGNOSIS — Z9221 Personal history of antineoplastic chemotherapy: Secondary | ICD-10-CM | POA: Diagnosis not present

## 2018-01-21 DIAGNOSIS — J9600 Acute respiratory failure, unspecified whether with hypoxia or hypercapnia: Secondary | ICD-10-CM | POA: Diagnosis not present

## 2018-01-21 DIAGNOSIS — R112 Nausea with vomiting, unspecified: Secondary | ICD-10-CM

## 2018-01-21 DIAGNOSIS — I2510 Atherosclerotic heart disease of native coronary artery without angina pectoris: Secondary | ICD-10-CM | POA: Diagnosis not present

## 2018-01-21 DIAGNOSIS — K831 Obstruction of bile duct: Secondary | ICD-10-CM | POA: Diagnosis present

## 2018-01-21 DIAGNOSIS — K8050 Calculus of bile duct without cholangitis or cholecystitis without obstruction: Secondary | ICD-10-CM

## 2018-01-21 DIAGNOSIS — K819 Cholecystitis, unspecified: Secondary | ICD-10-CM

## 2018-01-21 DIAGNOSIS — Z9049 Acquired absence of other specified parts of digestive tract: Secondary | ICD-10-CM | POA: Diagnosis not present

## 2018-01-21 DIAGNOSIS — R1010 Upper abdominal pain, unspecified: Secondary | ICD-10-CM | POA: Diagnosis not present

## 2018-01-21 DIAGNOSIS — C189 Malignant neoplasm of colon, unspecified: Secondary | ICD-10-CM | POA: Diagnosis not present

## 2018-01-21 DIAGNOSIS — Z85038 Personal history of other malignant neoplasm of large intestine: Secondary | ICD-10-CM

## 2018-01-21 DIAGNOSIS — I4891 Unspecified atrial fibrillation: Secondary | ICD-10-CM | POA: Diagnosis not present

## 2018-01-21 DIAGNOSIS — C7800 Secondary malignant neoplasm of unspecified lung: Secondary | ICD-10-CM | POA: Diagnosis not present

## 2018-01-21 DIAGNOSIS — R17 Unspecified jaundice: Secondary | ICD-10-CM | POA: Diagnosis not present

## 2018-01-21 DIAGNOSIS — R932 Abnormal findings on diagnostic imaging of liver and biliary tract: Secondary | ICD-10-CM | POA: Diagnosis not present

## 2018-01-21 DIAGNOSIS — R109 Unspecified abdominal pain: Secondary | ICD-10-CM | POA: Diagnosis not present

## 2018-01-21 HISTORY — DX: Acute kidney failure, unspecified: N17.9

## 2018-01-21 HISTORY — DX: Secondary malignant neoplasm of liver and intrahepatic bile duct: C78.7

## 2018-01-21 HISTORY — DX: Other specified diseases of gallbladder: K82.8

## 2018-01-21 HISTORY — DX: Adverse effect of unspecified anesthetic, initial encounter: T41.45XA

## 2018-01-21 HISTORY — DX: Dysphagia, unspecified: R13.10

## 2018-01-21 HISTORY — DX: Malignant neoplasm of colon, unspecified: C18.9

## 2018-01-21 HISTORY — DX: Other complications of anesthesia, initial encounter: T88.59XA

## 2018-01-21 LAB — COMPREHENSIVE METABOLIC PANEL
ALT: 92 U/L — AB (ref 17–63)
AST: 122 U/L — AB (ref 15–41)
Albumin: 3 g/dL — ABNORMAL LOW (ref 3.5–5.0)
Alkaline Phosphatase: 244 U/L — ABNORMAL HIGH (ref 38–126)
Anion gap: 10 (ref 5–15)
BILIRUBIN TOTAL: 6 mg/dL — AB (ref 0.3–1.2)
BUN: 10 mg/dL (ref 6–20)
CHLORIDE: 104 mmol/L (ref 101–111)
CO2: 24 mmol/L (ref 22–32)
CREATININE: 0.93 mg/dL (ref 0.61–1.24)
Calcium: 8.6 mg/dL — ABNORMAL LOW (ref 8.9–10.3)
GFR calc Af Amer: >60 mL/min (ref >60)
GLUCOSE: 171 mg/dL — AB (ref 65–99)
Potassium: 3.9 mmol/L (ref 3.5–5.1)
Sodium: 138 mmol/L (ref 135–145)
Total Protein: 6.7 g/dL (ref 6.5–8.1)

## 2018-01-21 LAB — URINALYSIS, ROUTINE W REFLEX MICROSCOPIC
GLUCOSE, UA: NEGATIVE mg/dL
HGB URINE DIPSTICK: NEGATIVE
Ketones, ur: NEGATIVE mg/dL
LEUKOCYTES UA: NEGATIVE
Nitrite: NEGATIVE
PROTEIN: NEGATIVE mg/dL
Specific Gravity, Urine: >1.046 — ABNORMAL HIGH (ref 1.005–1.030)
pH: 5 (ref 5.0–8.0)

## 2018-01-21 LAB — CBC
HCT: 49.7 % (ref 39.0–52.0)
Hemoglobin: 17.3 g/dL — ABNORMAL HIGH (ref 13.0–17.0)
MCH: 31.1 pg (ref 26.0–34.0)
MCHC: 34.8 g/dL (ref 30.0–36.0)
MCV: 89.4 fL (ref 78.0–100.0)
PLATELETS: 71 10*3/uL — AB (ref 150–400)
RBC: 5.56 MIL/uL (ref 4.22–5.81)
RDW: 15.3 % (ref 11.5–15.5)
WBC: 8.2 10*3/uL (ref 4.0–10.5)

## 2018-01-21 LAB — LIPASE, BLOOD: Lipase: 65 U/L — ABNORMAL HIGH (ref 11–51)

## 2018-01-21 MED ORDER — OXYCODONE HCL 5 MG PO TABS: 5.0000 mg | ORAL_TABLET | ORAL | Status: DC | PRN

## 2018-01-21 MED ORDER — ONDANSETRON HCL 4 MG/2ML IJ SOLN
4.0000 mg | Freq: Once | INTRAMUSCULAR | Status: AC
  Administered 2018-01-21: 4 mg via INTRAVENOUS
  Filled 2018-01-21: qty 2

## 2018-01-21 MED ORDER — ONDANSETRON 4 MG PO TBDP: 4.0000 mg | ORAL_TABLET | Freq: Once | ORAL | Status: DC

## 2018-01-21 MED ORDER — SODIUM CHLORIDE 0.9 % IV BOLUS (SEPSIS)
1000.0000 mL | Freq: Once | INTRAVENOUS | Status: AC
  Administered 2018-01-21: 1000 mL via INTRAVENOUS

## 2018-01-21 MED ORDER — ONDANSETRON HCL 4 MG/2ML IJ SOLN: 4.0000 mg | Freq: Four times a day (QID) | INTRAMUSCULAR | Status: DC | PRN

## 2018-01-21 MED ORDER — PIPERACILLIN-TAZOBACTAM 3.375 G IVPB 30 MIN
3.3750 g | Freq: Once | INTRAVENOUS | Status: AC
  Administered 2018-01-21: 3.375 g via INTRAVENOUS
  Filled 2018-01-21: qty 50

## 2018-01-21 MED ORDER — HYDROMORPHONE HCL 1 MG/ML IJ SOLN
0.5000 mg | INTRAMUSCULAR | Status: DC | PRN
  Administered 2018-01-21: 0.5 mg via INTRAVENOUS
  Filled 2018-01-21: qty 1

## 2018-01-21 MED ORDER — PIPERACILLIN-TAZOBACTAM 3.375 G IVPB
3.3750 g | Freq: Three times a day (TID) | INTRAVENOUS | Status: DC
  Administered 2018-01-21 – 2018-01-22 (×2): 3.375 g via INTRAVENOUS
  Filled 2018-01-21 (×4): qty 50

## 2018-01-21 MED ORDER — ACETAMINOPHEN 325 MG PO TABS: 650.0000 mg | ORAL_TABLET | Freq: Four times a day (QID) | ORAL | Status: DC | PRN

## 2018-01-21 MED ORDER — LORATADINE 10 MG PO TABS
10.0000 mg | ORAL_TABLET | Freq: Every day | ORAL | Status: DC
  Administered 2018-01-23: 10 mg via ORAL
  Filled 2018-01-21: qty 1

## 2018-01-21 MED ORDER — ADULT MULTIVITAMIN W/MINERALS CH
1.0000 | ORAL_TABLET | Freq: Every day | ORAL | Status: DC
  Administered 2018-01-23: 1 via ORAL
  Filled 2018-01-21: qty 1

## 2018-01-21 MED ORDER — HYDROMORPHONE HCL 1 MG/ML IJ SOLN
1.0000 mg | Freq: Once | INTRAMUSCULAR | Status: AC
  Administered 2018-01-21: 1 mg via INTRAVENOUS
  Filled 2018-01-21: qty 1

## 2018-01-21 MED ORDER — ROSUVASTATIN CALCIUM 10 MG PO TABS: 10.0000 mg | ORAL_TABLET | Freq: Every day | ORAL | Status: DC

## 2018-01-21 MED ORDER — CALCIUM CARBONATE ANTACID 500 MG PO CHEW
500.0000 mg | CHEWABLE_TABLET | Freq: Two times a day (BID) | ORAL | Status: DC
  Administered 2018-01-21 – 2018-01-23 (×3): 500 mg via ORAL
  Filled 2018-01-21 (×3): qty 3

## 2018-01-21 MED ORDER — ONDANSETRON HCL 4 MG PO TABS: 4.0000 mg | ORAL_TABLET | Freq: Four times a day (QID) | ORAL | Status: DC | PRN

## 2018-01-21 MED ORDER — IOPAMIDOL (ISOVUE-300) INJECTION 61%
100.0000 mL | Freq: Once | INTRAVENOUS | Status: AC | PRN
  Administered 2018-01-21: 100 mL via INTRAVENOUS

## 2018-01-21 MED ORDER — POLYETHYLENE GLYCOL 3350 17 G PO PACK: 17.0000 g | PACK | Freq: Every day | ORAL | Status: DC | PRN

## 2018-01-21 MED ORDER — VITAMIN B-12 100 MCG PO TABS
100.0000 ug | ORAL_TABLET | Freq: Every day | ORAL | Status: DC
  Administered 2018-01-23: 100 ug via ORAL
  Filled 2018-01-21 (×2): qty 1

## 2018-01-21 MED ORDER — SODIUM CHLORIDE 0.9 % IV SOLN
1.5000 g | INTRAVENOUS | Status: DC
  Filled 2018-01-21: qty 1.5

## 2018-01-21 MED ORDER — SODIUM CHLORIDE 0.9 % IV SOLN
INTRAVENOUS | Status: DC
  Administered 2018-01-21: via INTRAVENOUS

## 2018-01-21 MED ORDER — SODIUM CHLORIDE 0.9 % IV SOLN
INTRAVENOUS | Status: DC
  Administered 2018-01-21 – 2018-01-23 (×4): via INTRAVENOUS

## 2018-01-21 MED ORDER — SODIUM CHLORIDE 0.9 % IV SOLN
Freq: Once | INTRAVENOUS | Status: AC
  Administered 2018-01-21: 17:00:00 via INTRAVENOUS

## 2018-01-21 MED ORDER — ACETAMINOPHEN 650 MG RE SUPP: 650.0000 mg | Freq: Four times a day (QID) | RECTAL | Status: DC | PRN

## 2018-01-21 MED ORDER — GADOBENATE DIMEGLUMINE 529 MG/ML IV SOLN
18.0000 mL | Freq: Once | INTRAVENOUS | Status: AC
  Administered 2018-01-21: 18 mL via INTRAVENOUS

## 2018-01-21 MED ORDER — INDOMETHACIN 50 MG RE SUPP: 100.0000 mg | RECTAL | Status: AC

## 2018-01-21 NOTE — H&P (View-Only) (Signed)
Consultation  Referring Provider:     ED/TRH Primary Care Physician:  Lujean Amel, MD Primary Gastroenterologist:      None here   Reason for Consultation:     Abnormal bile duct CT - ? CBD stone     Impression / Plan:   Possible choledocholithiasis Cholelithiasis Metastatic colon cancer - liver and hx lung - on Regorafenib - followed by Dr. Verlan Friends at [place] w/ MRCP - if confirms a stone will do ERCP w/ sphincterotomy and stone extraction The risks and benefits as well as alternatives of endoscopic procedure(s) have been discussed and reviewed. All questions answered. The patient agrees to proceed.   Also agree w/ holding Regorafenib (note it along with his liver mets can cause abnl LFT's)  Will get an INR for AM given liver mets and possible dysfx from chemo  Gatha Mayer, MD, [facility] [phone] (pager) Feb 19, 2018 5:52 PM    HPI:   Benjamin Blake is a 67 y.o. male with hx metastatic colon cancer on medical therapy as above who was in [place] until awakening with severe upper abdominal pain about 3 hrs after breakfast. Sharp and no radiation. Steady. Came to ED. Emesis x 1. CT scan showed dilated CBD and ? Stone in distal CBD and known liver mets and a gallstone. He feels better now, w/o pain, he does have a sore back, however.   Lab Results  Component Value Date   ALT 92 (H) 2018-02-19   AST 122 (H) Feb 19, 2018   ALKPHOS 244 (H) 02/19/18   BILITOT 6.0 (H) 02/19/18    2/7 at [facility] bili, AST and ALT elevated 79 and 64 and alk phos 215  Past Medical History:  Diagnosis Date  . AKI (acute kidney injury) ([place]) 02/2015  . Colon cancer metastasized to liver [facility]) 2010   initial Dx and surgical resection 2010, recurrent with liver, lung, peritoneal mets 2015.  treated with chemo at [facility].    . Coronary artery calcification seen on CAT scan 03/08/2015  . Dysphagia 02/2015   dysphagia 1 diet.  s/p FEES  . Gallbladder sludge 2016     Past Surgical History:  Procedure Laterality Date  . COLON SURGERY  2010   resection of colon cancer in [place].      Family History  Problem Relation Age of Onset  . Cancer - Colon Mother   . Heart disease Father     Social History   Tobacco Use  . Smoking status: Never Smoker  . Smokeless tobacco: Never Used  Substance Use Topics  . Alcohol use: Yes    Comment: Social drinker  . Drug use: No   Social History   Social History Narrative   Married and retired   From [employer] to [place] [employer] 2015 - family here and for medical care at [facility]    Prior to Admission medications   Medication Sig Start Date End Date Taking? Authorizing Provider  calcium elemental as carbonate (TUMS ULTRA 1000) 400 MG chewable tablet Chew 400 mg by mouth as needed.   Yes [provider]  Cetirizine HCl (ZYRTEC ALLERGY) 10 MG CAPS Take 10 mg by mouth daily.   Yes [provider]  loperamide (IMODIUM) 2 MG capsule Take 2 mg by mouth as needed.   Yes [provider]  Multiple Vitamin (MULTIVITAMIN) capsule Take 1 capsule by mouth daily.   Yes [provider]  ondansetron (ZOFRAN) 8 MG tablet  Take 1 tablet (8 mg total) by mouth every 8 (eight) hours as needed for nausea or vomiting. 02/27/15  Yes Thurnell Lose, MD  rosuvastatin (CRESTOR) 10 MG tablet Take 10 mg by mouth daily. 09/08/14  Yes [provider]  sildenafil (VIAGRA) 25 MG tablet Take 25 mg by mouth daily as needed. 09/26/17  Yes [provider]  STIVARGA 40 MG tablet Take 40 mg by mouth daily. 12/31/17  Yes [provider]  vitamin B-12 (CYANOCOBALAMIN) 100 MCG tablet Take 100 mcg by mouth daily.   Yes [provider]  aspirin EC 81 MG tablet Take 1 tablet (81 mg total) by mouth daily. Patient not taking: Reported on 07/04/2017 03/06/15   Croitoru, Dani Gobble, MD  ciprofloxacin (CIPRO) 250 MG tablet Take 1 tablet (250 mg total) by mouth 2 (two) times daily. Patient not  taking: Reported on 07/04/2017 11/01/15   Vivi Barrack, MD    Current Facility-Administered Medications  Medication Dose Route Frequency Provider Last Rate Last Dose  . 0.9 %  sodium chloride infusion   Intravenous Continuous Purohit, Shrey C, MD      . acetaminophen (TYLENOL) tablet 650 mg  650 mg Oral Q6H PRN Purohit, Konrad Dolores, MD       Or  . acetaminophen (TYLENOL) suppository 650 mg  650 mg Rectal Q6H PRN Purohit, Shrey C, MD      . calcium carbonate (BARIATRIC TUMS ULTRA) 1000 mg with elemental calcium 400 mg/chewable tablet  400 mg Oral BID Purohit, Shrey C, MD      . HYDROmorphone (DILAUDID) injection 0.5 mg  0.5 mg Intravenous Q4H PRN Purohit, Shrey C, MD      . loratadine (CLARITIN) tablet 10 mg  10 mg Oral Daily Purohit, Shrey C, MD      . multivitamin capsule 1 capsule  1 capsule Oral Daily Purohit, Shrey C, MD      . ondansetron (ZOFRAN) tablet 4 mg  4 mg Oral Q6H PRN Purohit, Shrey C, MD       Or  . ondansetron (ZOFRAN) injection 4 mg  4 mg Intravenous Q6H PRN Purohit, Shrey C, MD      . oxyCODONE (Oxy IR/ROXICODONE) immediate release tablet 5 mg  5 mg Oral Q4H PRN Purohit, Shrey C, MD      . polyethylene glycol (MIRALAX / GLYCOLAX) packet 17 g  17 g Oral Daily PRN Purohit, Shrey C, MD      . vitamin B-12 (CYANOCOBALAMIN) tablet 100 mcg  100 mcg Oral Daily Purohit, Konrad Dolores, MD       Current Outpatient Medications  Medication Sig Dispense Refill  . calcium elemental as carbonate (TUMS ULTRA 1000) 400 MG chewable tablet Chew 400 mg by mouth as needed.    . Cetirizine HCl (ZYRTEC ALLERGY) 10 MG CAPS Take 10 mg by mouth daily.    Marland Kitchen loperamide (IMODIUM) 2 MG capsule Take 2 mg by mouth as needed.    . Multiple Vitamin (MULTIVITAMIN) capsule Take 1 capsule by mouth daily.    . ondansetron (ZOFRAN) 8 MG tablet Take 1 tablet (8 mg total) by mouth every 8 (eight) hours as needed for nausea or vomiting. 30 tablet 0  . rosuvastatin (CRESTOR) 10 MG tablet Take 10 mg by mouth daily.    .  sildenafil (VIAGRA) 25 MG tablet Take 25 mg by mouth daily as needed.    . STIVARGA 40 MG tablet Take 40 mg by mouth daily.    . vitamin B-12 (CYANOCOBALAMIN)  100 MCG tablet Take 100 mcg by mouth daily.    Marland Kitchen aspirin EC 81 MG tablet Take 1 tablet (81 mg total) by mouth daily. (Patient not taking: Reported on 07/04/2017) 90 tablet 3  . ciprofloxacin (CIPRO) 250 MG tablet Take 1 tablet (250 mg total) by mouth 2 (two) times daily. (Patient not taking: Reported on 07/04/2017) 14 tablet 0    Allergies as of 01/21/2018  . (No Known Allergies)     Review of Systems:    This is positive for those things mentioned in the HPI All other review of systems are negative.       Physical Exam:  Vital signs in last 24 hours: Temp:  [97.6 F (36.4 C)] 97.6 F (36.4 C) (02/15 1302) Pulse Rate:  [74-124] 124 (02/15 1730) Resp:  [18-26] 21 (02/15 1730) BP: (142-180)/(86-103) 162/94 (02/15 1730) SpO2:  [99 %-100 %] 99 % (02/15 1733) Weight:  [178 lb (80.7 kg)] 178 lb (80.7 kg) (02/15 1307)    General:  Well-developed, well-nourished and in no acute distress Eyes:  icteric. Lungs: Clear to auscultation bilaterally. Heart:  S1S2, no rubs, murmurs, gallops. Abdomen:  soft, mildly tender upper abdomen and  mildly distended, and BS+.  Extremities:   no edema, cyanosis or clubbing Skin   jaundiced Neuro:  A&O x 3.  Psych:  appropriate mood and  Affect.   Data Reviewed:   LAB RESULTS: Recent Labs    01/21/18 1307  WBC 8.2  HGB 17.3*  HCT 49.7  PLT 71*   BMET Recent Labs    01/21/18 1307  NA 138  K 3.9  CL 104  CO2 24  GLUCOSE 171*  BUN 10  CREATININE 0.93  CALCIUM 8.6*   LFT Recent Labs    01/21/18 1307  PROT 6.7  ALBUMIN 3.0*  AST 122*  ALT 92*  ALKPHOS 244*  BILITOT 6.0*    STUDIES: images viewed Ct Abdomen Pelvis W Contrast  Result Date: 01/21/2018 CLINICAL DATA:  Lower abdominal pain.  History of colon cancer. EXAM: CT ABDOMEN AND PELVIS WITH CONTRAST TECHNIQUE:  Multidetector CT imaging of the abdomen and pelvis was performed using the standard protocol following bolus administration of intravenous contrast. CONTRAST:  130m ISOVUE-300 IOPAMIDOL (ISOVUE-300) INJECTION 61% COMPARISON:  02/18/2015 and CT chest from 02/18/2015 FINDINGS: Lower chest: No pleural effusions. Right lower lobe pulmonary nodule is new from the previous exam measuring 1 cm, image 3 of series 4. Hepatobiliary: Multifocal liver metastasis are again noted. These are difficult to compare with previous exam performed without IV contrast. Lesion within left lobe of liver measures 1.9 by 2.1 cm, image 21 of series 3. Previously this measured approximately 1.9 x 1.2 cm. Posterior right lobe of liver lesion measures 1.1 by 1.0 cm, image 26 of series 3. Previously this measured 1 by 0.7 cm. Segment 6 liver metastasis measures 2.6 by 2.2 cm, image 35 of series 3. On the previous exam this measured 1.2 by 1.3 cm. Tiny stone within the dependent portion of the gallbladder. Increase caliber of the CBD measuring 1.2 cm. There may be a tiny stone at the level of the ampulla measuring approximately 4 mm. New from previous exam. Pancreas: Normal appearance of the pancreas. Spleen: The spleen measures 13.5 by 9.9 by 4.4 cm (volume = 310 cm^3). Adrenals/Urinary Tract: The adrenal glands are normal. Small right kidney cysts noted. No kidney mass or hydronephrosis identified. Urinary bladder appears normal. Stomach/Bowel: The stomach appears unremarkable. No abnormal small bowel dilatation identified.  Status post right hemicolectomy with enterocolonic anastomosis. No pathologic dilatation of the remaining portions of the colon. The wall of the sigmoid colon appears thickened which is favored to reflect incomplete distention. No surrounding inflammatory changes. Distal colonic diverticula noted without findings to suggest acute diverticulitis. Vascular/Lymphatic: Aortic atherosclerosis. No aneurysm. Small paraesophageal  varices identified. The portal vein remains patent. The hepatic veins are also patent. No abdominal or pelvic adenopathy identified. Reproductive: Prostate is unremarkable. Other: No free fluid or fluid collections within the abdomen or pelvis. Supraumbilical ventral abdominal wall hernia contains fat measuring 2.9 cm. Musculoskeletal: No aggressive lytic or sclerotic bone lesions. IMPRESSION: 1. Multifocal liver metastasis are again noted. Difficult to compare with previous studies performed without IV contrast material. Favor mild progression of liver lesions. 2. New right lower lobe pulmonary nodule worrisome for metastatic disease. 3. New increase caliber of the common bile duct. There may be a small 4 mm stone at the ampulla. Consider further evaluation with MRI/MRCP. 4.  Aortic Atherosclerosis (ICD10-I70.0). Electronically Signed   By: Kerby Moors M.D.   On: 01/21/2018 15:35        Thanks   LOS: 0 days   _0  E. Carlean Purl, MD, [facility] @  01/21/2018, 5:52 PM

## 2018-01-21 NOTE — Progress Notes (Signed)
   MRCP + for 2 CBD stones  Ordered ERCP for tomorrow - procedure time TBA  Gatha Mayer, MD, [facility] [phone] (pager) 01/21/2018 9:38 PM

## 2018-01-21 NOTE — ED Provider Notes (Signed)
[place] EMERGENCY DEPARTMENT Provider Note   CSN: [id number] Arrival date & time: 01/21/18  1258     History   Chief Complaint Chief Complaint  Patient presents with  . Abdominal Pain    HPI Benjamin Blake is a 67 y.o. male.  HPI   67 year old male with history of metastatic colon cancer here with diffuse abdominal pain.  The patient states his symptoms started earlier today.  He describes a bilateral lower abdominal pain that is aching, cramping, and severe pain he has had associated nausea and vomiting and has been unable to eat or drink.  He has not been having any bowel movements or passing flatus throughout the day today.  Last bowel movement was normal and it was yesterday.  He has a history of similar symptoms due to biliary sludge in the past and thought this was the reason for his current pain.  However, he denies any right upper quadrant epigastric pain.  His emesis was not bilious.  Denies history of bowel obstructions.  He is currently on oral chemotherapy.  Pain is worse with eating.  No alleviating factors.   Past Medical History:  Diagnosis Date  . Cancer ([place])   . Coronary artery calcification seen on CAT scan 03/08/2015    Patient Active Problem List   Diagnosis Date Noted  . Abnormal ECG 03/08/2015  . Coronary artery calcification seen on CAT scan 03/08/2015  . Drug-induced hypotension 03/08/2015  . A-fib ([place]) 02/23/2015  . Nausea with vomiting 02/18/2015  . Colon cancer, metastatic 02/18/2015  . Acute respiratory failure ([place]) 02/18/2015  . Aspiration into airway 02/18/2015  . Abdominal pain   . AKI (acute kidney injury) ([place]) 02/16/2015  . Hypokalemia 02/16/2015  . Hyponatremia 02/16/2015  . Chemotherapy-induced vomiting 02/16/2015  . Acute kidney injury ([place]) 02/16/2015  . Acute gastroenteritis     Past Surgical History:  Procedure Laterality Date  . COLON SURGERY         Home Medications    Prior to Admission  medications   Medication Sig Start Date End Date Taking? Authorizing Provider  calcium elemental as carbonate (TUMS ULTRA 1000) 400 MG chewable tablet Chew 400 mg by mouth as needed.   Yes [provider]  Cetirizine HCl (ZYRTEC ALLERGY) 10 MG CAPS Take 10 mg by mouth daily.   Yes [provider]  loperamide (IMODIUM) 2 MG capsule Take 2 mg by mouth as needed.   Yes [provider]  Multiple Vitamin (MULTIVITAMIN) capsule Take 1 capsule by mouth daily.   Yes [provider]  ondansetron (ZOFRAN) 8 MG tablet Take 1 tablet (8 mg total) by mouth every 8 (eight) hours as needed for nausea or vomiting. 02/27/15  Yes Thurnell Lose, MD  rosuvastatin (CRESTOR) 10 MG tablet Take 10 mg by mouth daily. 09/08/14  Yes [provider]  sildenafil (VIAGRA) 25 MG tablet Take 25 mg by mouth daily as needed. 09/26/17  Yes [provider]  STIVARGA 40 MG tablet Take 40 mg by mouth daily. 12/31/17  Yes [provider]  vitamin B-12 (CYANOCOBALAMIN) 100 MCG tablet Take 100 mcg by mouth daily.   Yes [provider]  aspirin EC 81 MG tablet Take 1 tablet (81 mg total) by mouth daily. Patient not taking: Reported on 07/04/2017 03/06/15   Croitoru, Dani Gobble, MD  ciprofloxacin (CIPRO) 250 MG tablet Take 1 tablet (250 mg total) by mouth 2 (two) times daily. Patient not taking: Reported on 07/04/2017 11/01/15  Vivi Barrack, MD    Family History Family History  Problem Relation Age of Onset  . Cancer - Colon Mother   . Heart disease Father     Social History Social History   Tobacco Use  . Smoking status: Never Smoker  . Smokeless tobacco: Never Used  Substance Use Topics  . Alcohol use: Yes    Comment: Social drinker  . Drug use: No     Allergies   Patient has no known allergies.   Review of Systems Review of Systems  Constitutional: Positive for fatigue. Negative for chills and fever.  HENT: Negative for congestion and  rhinorrhea.   Eyes: Negative for visual disturbance.  Respiratory: Negative for cough, shortness of breath and wheezing.   Cardiovascular: Negative for chest pain and leg swelling.  Gastrointestinal: Positive for abdominal pain, nausea and vomiting. Negative for diarrhea.  Genitourinary: Negative for dysuria and flank pain.  Musculoskeletal: Negative for neck pain and neck stiffness.  Skin: Negative for rash and wound.  Allergic/Immunologic: Negative for immunocompromised state.  Neurological: Positive for weakness. Negative for syncope and headaches.  All other systems reviewed and are negative.    Physical Exam Updated Vital Signs BP (!) 142/86 (BP Location: Right Arm)   Pulse 74   Temp 97.6 F (36.4 C)   Resp 18   Ht 5\' 8"  (1.727 m)   Wt 80.7 kg (178 lb)   SpO2 100%   BMI 27.06 kg/m   Physical Exam  Constitutional: He is oriented to person, place, and time. He appears well-developed and well-nourished. No distress.  HENT:  Head: Normocephalic and atraumatic.  Eyes: Conjunctivae are normal.  Neck: Neck supple.  Cardiovascular: Normal rate, regular rhythm and normal heart sounds. Exam reveals no friction rub.  No murmur heard. Pulmonary/Chest: Effort normal and breath sounds normal. No respiratory distress. He has no wheezes. He has no rales.  Abdominal: Soft. Normal appearance. Bowel sounds are decreased. There is generalized tenderness and tenderness in the right lower quadrant, suprapubic area and left lower quadrant. There is no rigidity, no rebound, no guarding and negative Murphy's sign.  Musculoskeletal: He exhibits no edema.  Neurological: He is alert and oriented to person, place, and time. He exhibits normal muscle tone.  Skin: Skin is warm. Capillary refill takes less than 2 seconds.  Psychiatric: He has a normal mood and affect.  Nursing note and vitals reviewed.    ED Treatments / Results  Labs (all labs ordered are listed, but only abnormal results are  displayed) Labs Reviewed  LIPASE, BLOOD - Abnormal; Notable for the following components:      Result Value   Lipase 65 (*)    All other components within normal limits  COMPREHENSIVE METABOLIC PANEL - Abnormal; Notable for the following components:   Glucose, Bld 171 (*)    Calcium 8.6 (*)    Albumin 3.0 (*)    AST 122 (*)    ALT 92 (*)    Alkaline Phosphatase 244 (*)    Total Bilirubin 6.0 (*)    All other components within normal limits  CBC - Abnormal; Notable for the following components:   Hemoglobin 17.3 (*)    Platelets 71 (*)    All other components within normal limits  URINALYSIS, ROUTINE W REFLEX MICROSCOPIC - Abnormal; Notable for the following components:   Color, Urine AMBER (*)    Specific Gravity, Urine >1.046 (*)    Bilirubin Urine SMALL (*)    All other components  within normal limits    EKG  EKG Interpretation None       Radiology Ct Abdomen Pelvis W Contrast  Result Date: 01/21/2018 CLINICAL DATA:  Lower abdominal pain.  History of colon cancer. EXAM: CT ABDOMEN AND PELVIS WITH CONTRAST TECHNIQUE: Multidetector CT imaging of the abdomen and pelvis was performed using the standard protocol following bolus administration of intravenous contrast. CONTRAST:  125mL ISOVUE-300 IOPAMIDOL (ISOVUE-300) INJECTION 61% COMPARISON:  02/18/2015 and CT chest from 02/18/2015 FINDINGS: Lower chest: No pleural effusions. Right lower lobe pulmonary nodule is new from the previous exam measuring 1 cm, image 3 of series 4. Hepatobiliary: Multifocal liver metastasis are again noted. These are difficult to compare with previous exam performed without IV contrast. Lesion within left lobe of liver measures 1.9 by 2.1 cm, image 21 of series 3. Previously this measured approximately 1.9 x 1.2 cm. Posterior right lobe of liver lesion measures 1.1 by 1.0 cm, image 26 of series 3. Previously this measured 1 by 0.7 cm. Segment 6 liver metastasis measures 2.6 by 2.2 cm, image 35 of series 3.  On the previous exam this measured 1.2 by 1.3 cm. Tiny stone within the dependent portion of the gallbladder. Increase caliber of the CBD measuring 1.2 cm. There may be a tiny stone at the level of the ampulla measuring approximately 4 mm. New from previous exam. Pancreas: Normal appearance of the pancreas. Spleen: The spleen measures 13.5 by 9.9 by 4.4 cm (volume = 310 cm^3). Adrenals/Urinary Tract: The adrenal glands are normal. Small right kidney cysts noted. No kidney mass or hydronephrosis identified. Urinary bladder appears normal. Stomach/Bowel: The stomach appears unremarkable. No abnormal small bowel dilatation identified. Status post right hemicolectomy with enterocolonic anastomosis. No pathologic dilatation of the remaining portions of the colon. The wall of the sigmoid colon appears thickened which is favored to reflect incomplete distention. No surrounding inflammatory changes. Distal colonic diverticula noted without findings to suggest acute diverticulitis. Vascular/Lymphatic: Aortic atherosclerosis. No aneurysm. Small paraesophageal varices identified. The portal vein remains patent. The hepatic veins are also patent. No abdominal or pelvic adenopathy identified. Reproductive: Prostate is unremarkable. Other: No free fluid or fluid collections within the abdomen or pelvis. Supraumbilical ventral abdominal wall hernia contains fat measuring 2.9 cm. Musculoskeletal: No aggressive lytic or sclerotic bone lesions. IMPRESSION: 1. Multifocal liver metastasis are again noted. Difficult to compare with previous studies performed without IV contrast material. Favor mild progression of liver lesions. 2. New right lower lobe pulmonary nodule worrisome for metastatic disease. 3. New increase caliber of the common bile duct. There may be a small 4 mm stone at the ampulla. Consider further evaluation with MRI/MRCP. 4.  Aortic Atherosclerosis (ICD10-I70.0). Electronically Signed   By: Kerby Moors M.D.   On:  01/21/2018 15:35    Procedures Procedures (including critical care time)  Medications Ordered in ED Medications  sodium chloride 0.9 % bolus 1,000 mL (1,000 mLs Intravenous New Bag/Given 01/21/18 1508)  piperacillin-tazobactam (ZOSYN) IVPB 3.375 g (not administered)  HYDROmorphone (DILAUDID) injection 1 mg (not administered)  sodium chloride 0.9 % bolus 1,000 mL (not administered)  ondansetron (ZOFRAN) injection 4 mg (4 mg Intravenous Given 01/21/18 1357)  HYDROmorphone (DILAUDID) injection 1 mg (1 mg Intravenous Given 01/21/18 1444)  ondansetron (ZOFRAN) injection 4 mg (4 mg Intravenous Given 01/21/18 1445)  iopamidol (ISOVUE-300) 61 % injection 100 mL (100 mLs Intravenous Contrast Given 01/21/18 1450)     Initial Impression / Assessment and Plan / ED Course  I have reviewed the  triage vital signs and the nursing notes.  Pertinent labs & imaging results that were available during my care of the patient were reviewed by me and considered in my medical decision making (see chart for details).    67 year old male with history of colon cancer status post colectomy here with diffuse abdominal pain, nausea, and relative constipation.  Concern for possible bowel obstruction.  Will check lab work, CT scan, and reassess.  No history of obstructions.  CT scan shows possible increased caliber of the common bile duct.  The patient has possible 4 mm stone at the ampulla.  Will discuss with GI.  Patient has ongoing pain and nausea.  Will plan for admission for possible common bile duct stones.  IV fluids given.  IV Zosyn given.  Discussed with Dr. Carlean Purl who will see pt. Agrees with MRCP, admission to medicine with IVF/symptom control.  Final Clinical Impressions(s) / ED Diagnoses   Final diagnoses:  Non-intractable vomiting with nausea, unspecified vomiting type  Elevated bilirubin  Elevated transaminase level      Duffy Bruce, MD 01/21/18 1605

## 2018-01-21 NOTE — Consult Note (Signed)
Consultation  Referring Provider:     ED/TRH Primary Care Physician:  Lujean Amel, MD Primary Gastroenterologist:      None here   Reason for Consultation:     Abnormal bile duct CT - ? CBD stone     Impression / Plan:   Possible choledocholithiasis Cholelithiasis Metastatic colon cancer - liver and hx lung - on Regorafenib - followed by Dr. Verlan Friends at [place] w/ MRCP - if confirms a stone will do ERCP w/ sphincterotomy and stone extraction The risks and benefits as well as alternatives of endoscopic procedure(s) have been discussed and reviewed. All questions answered. The patient agrees to proceed.   Also agree w/ holding Regorafenib (note it along with his liver mets can cause abnl LFT's)  Will get an INR for AM given liver mets and possible dysfx from chemo  Gatha Mayer, MD, [facility] [phone] (pager) Feb 19, 2018 5:52 PM    HPI:   Benjamin Blake is a 67 y.o. male with hx metastatic colon cancer on medical therapy as above who was in [place] until awakening with severe upper abdominal pain about 3 hrs after breakfast. Sharp and no radiation. Steady. Came to ED. Emesis x 1. CT scan showed dilated CBD and ? Stone in distal CBD and known liver mets and a gallstone. He feels better now, w/o pain, he does have a sore back, however.   Lab Results  Component Value Date   ALT 92 (H) 2018-02-19   AST 122 (H) Feb 19, 2018   ALKPHOS 244 (H) 02/19/18   BILITOT 6.0 (H) 02/19/18    2/7 at [facility] bili, AST and ALT elevated 79 and 64 and alk phos 215  Past Medical History:  Diagnosis Date  . AKI (acute kidney injury) ([place]) 02/2015  . Colon cancer metastasized to liver [facility]) 2010   initial Dx and surgical resection 2010, recurrent with liver, lung, peritoneal mets 2015.  treated with chemo at [facility].    . Coronary artery calcification seen on CAT scan 03/08/2015  . Dysphagia 02/2015   dysphagia 1 diet.  s/p FEES  . Gallbladder sludge 2016     Past Surgical History:  Procedure Laterality Date  . COLON SURGERY  2010   resection of colon cancer in [place].      Family History  Problem Relation Age of Onset  . Cancer - Colon Mother   . Heart disease Father     Social History   Tobacco Use  . Smoking status: Never Smoker  . Smokeless tobacco: Never Used  Substance Use Topics  . Alcohol use: Yes    Comment: Social drinker  . Drug use: No   Social History   Social History Narrative   Married and retired   From [employer] to [employer] 2015 - family here and for medical care at [facility]    Prior to Admission medications   Medication Sig Start Date End Date Taking? Authorizing Provider  calcium elemental as carbonate (TUMS ULTRA 1000) 400 MG chewable tablet Chew 400 mg by mouth as needed.   Yes [provider]  Cetirizine HCl (ZYRTEC ALLERGY) 10 MG CAPS Take 10 mg by mouth daily.   Yes [provider]  loperamide (IMODIUM) 2 MG capsule Take 2 mg by mouth as needed.   Yes [provider]  Multiple Vitamin (MULTIVITAMIN) capsule Take 1 capsule by mouth daily.   Yes [provider]  ondansetron (ZOFRAN) 8 MG tablet  Take 1 tablet (8 mg total) by mouth every 8 (eight) hours as needed for nausea or vomiting. 02/27/15  Yes Thurnell Lose, MD  rosuvastatin (CRESTOR) 10 MG tablet Take 10 mg by mouth daily. 09/08/14  Yes [provider]  sildenafil (VIAGRA) 25 MG tablet Take 25 mg by mouth daily as needed. 09/26/17  Yes [provider]  STIVARGA 40 MG tablet Take 40 mg by mouth daily. 12/31/17  Yes [provider]  vitamin B-12 (CYANOCOBALAMIN) 100 MCG tablet Take 100 mcg by mouth daily.   Yes [provider]  aspirin EC 81 MG tablet Take 1 tablet (81 mg total) by mouth daily. Patient not taking: Reported on 07/04/2017 03/06/15   Croitoru, Dani Gobble, MD  ciprofloxacin (CIPRO) 250 MG tablet Take 1 tablet (250 mg total) by mouth 2 (two) times daily. Patient not  taking: Reported on 07/04/2017 11/01/15   Vivi Barrack, MD    Current Facility-Administered Medications  Medication Dose Route Frequency Provider Last Rate Last Dose  . 0.9 %  sodium chloride infusion   Intravenous Continuous Purohit, Shrey C, MD      . acetaminophen (TYLENOL) tablet 650 mg  650 mg Oral Q6H PRN Purohit, Konrad Dolores, MD       Or  . acetaminophen (TYLENOL) suppository 650 mg  650 mg Rectal Q6H PRN Purohit, Shrey C, MD      . calcium carbonate (BARIATRIC TUMS ULTRA) 1000 mg with elemental calcium 400 mg/chewable tablet  400 mg Oral BID Purohit, Shrey C, MD      . HYDROmorphone (DILAUDID) injection 0.5 mg  0.5 mg Intravenous Q4H PRN Purohit, Shrey C, MD      . loratadine (CLARITIN) tablet 10 mg  10 mg Oral Daily Purohit, Shrey C, MD      . multivitamin capsule 1 capsule  1 capsule Oral Daily Purohit, Shrey C, MD      . ondansetron (ZOFRAN) tablet 4 mg  4 mg Oral Q6H PRN Purohit, Shrey C, MD       Or  . ondansetron (ZOFRAN) injection 4 mg  4 mg Intravenous Q6H PRN Purohit, Shrey C, MD      . oxyCODONE (Oxy IR/ROXICODONE) immediate release tablet 5 mg  5 mg Oral Q4H PRN Purohit, Shrey C, MD      . polyethylene glycol (MIRALAX / GLYCOLAX) packet 17 g  17 g Oral Daily PRN Purohit, Shrey C, MD      . vitamin B-12 (CYANOCOBALAMIN) tablet 100 mcg  100 mcg Oral Daily Purohit, Konrad Dolores, MD       Current Outpatient Medications  Medication Sig Dispense Refill  . calcium elemental as carbonate (TUMS ULTRA 1000) 400 MG chewable tablet Chew 400 mg by mouth as needed.    . Cetirizine HCl (ZYRTEC ALLERGY) 10 MG CAPS Take 10 mg by mouth daily.    Marland Kitchen loperamide (IMODIUM) 2 MG capsule Take 2 mg by mouth as needed.    . Multiple Vitamin (MULTIVITAMIN) capsule Take 1 capsule by mouth daily.    . ondansetron (ZOFRAN) 8 MG tablet Take 1 tablet (8 mg total) by mouth every 8 (eight) hours as needed for nausea or vomiting. 30 tablet 0  . rosuvastatin (CRESTOR) 10 MG tablet Take 10 mg by mouth daily.    .  sildenafil (VIAGRA) 25 MG tablet Take 25 mg by mouth daily as needed.    . STIVARGA 40 MG tablet Take 40 mg by mouth daily.    . vitamin B-12 (CYANOCOBALAMIN)  100 MCG tablet Take 100 mcg by mouth daily.    Marland Kitchen aspirin EC 81 MG tablet Take 1 tablet (81 mg total) by mouth daily. (Patient not taking: Reported on 07/04/2017) 90 tablet 3  . ciprofloxacin (CIPRO) 250 MG tablet Take 1 tablet (250 mg total) by mouth 2 (two) times daily. (Patient not taking: Reported on 07/04/2017) 14 tablet 0    Allergies as of 01/21/2018  . (No Known Allergies)     Review of Systems:    This is positive for those things mentioned in the HPI All other review of systems are negative.       Physical Exam:  Vital signs in last 24 hours: Temp:  [97.6 F (36.4 C)] 97.6 F (36.4 C) (02/15 1302) Pulse Rate:  [74-124] 124 (02/15 1730) Resp:  [18-26] 21 (02/15 1730) BP: (142-180)/(86-103) 162/94 (02/15 1730) SpO2:  [99 %-100 %] 99 % (02/15 1733) Weight:  [178 lb (80.7 kg)] 178 lb (80.7 kg) (02/15 1307)    General:  Well-developed, well-nourished and in no acute distress Eyes:  icteric. Lungs: Clear to auscultation bilaterally. Heart:  S1S2, no rubs, murmurs, gallops. Abdomen:  soft, mildly tender upper abdomen and  mildly distended, and BS+.  Extremities:   no edema, cyanosis or clubbing Skin   jaundiced Neuro:  A&O x 3.  Psych:  appropriate mood and  Affect.   Data Reviewed:   LAB RESULTS: Recent Labs    01/21/18 1307  WBC 8.2  HGB 17.3*  HCT 49.7  PLT 71*   BMET Recent Labs    01/21/18 1307  NA 138  K 3.9  CL 104  CO2 24  GLUCOSE 171*  BUN 10  CREATININE 0.93  CALCIUM 8.6*   LFT Recent Labs    01/21/18 1307  PROT 6.7  ALBUMIN 3.0*  AST 122*  ALT 92*  ALKPHOS 244*  BILITOT 6.0*    STUDIES: images viewed Ct Abdomen Pelvis W Contrast  Result Date: 01/21/2018 CLINICAL DATA:  Lower abdominal pain.  History of colon cancer. EXAM: CT ABDOMEN AND PELVIS WITH CONTRAST TECHNIQUE:  Multidetector CT imaging of the abdomen and pelvis was performed using the standard protocol following bolus administration of intravenous contrast. CONTRAST:  130m ISOVUE-300 IOPAMIDOL (ISOVUE-300) INJECTION 61% COMPARISON:  02/18/2015 and CT chest from 02/18/2015 FINDINGS: Lower chest: No pleural effusions. Right lower lobe pulmonary nodule is new from the previous exam measuring 1 cm, image 3 of series 4. Hepatobiliary: Multifocal liver metastasis are again noted. These are difficult to compare with previous exam performed without IV contrast. Lesion within left lobe of liver measures 1.9 by 2.1 cm, image 21 of series 3. Previously this measured approximately 1.9 x 1.2 cm. Posterior right lobe of liver lesion measures 1.1 by 1.0 cm, image 26 of series 3. Previously this measured 1 by 0.7 cm. Segment 6 liver metastasis measures 2.6 by 2.2 cm, image 35 of series 3. On the previous exam this measured 1.2 by 1.3 cm. Tiny stone within the dependent portion of the gallbladder. Increase caliber of the CBD measuring 1.2 cm. There may be a tiny stone at the level of the ampulla measuring approximately 4 mm. New from previous exam. Pancreas: Normal appearance of the pancreas. Spleen: The spleen measures 13.5 by 9.9 by 4.4 cm (volume = 310 cm^3). Adrenals/Urinary Tract: The adrenal glands are normal. Small right kidney cysts noted. No kidney mass or hydronephrosis identified. Urinary bladder appears normal. Stomach/Bowel: The stomach appears unremarkable. No abnormal small bowel dilatation identified.  Status post right hemicolectomy with enterocolonic anastomosis. No pathologic dilatation of the remaining portions of the colon. The wall of the sigmoid colon appears thickened which is favored to reflect incomplete distention. No surrounding inflammatory changes. Distal colonic diverticula noted without findings to suggest acute diverticulitis. Vascular/Lymphatic: Aortic atherosclerosis. No aneurysm. Small paraesophageal  varices identified. The portal vein remains patent. The hepatic veins are also patent. No abdominal or pelvic adenopathy identified. Reproductive: Prostate is unremarkable. Other: No free fluid or fluid collections within the abdomen or pelvis. Supraumbilical ventral abdominal wall hernia contains fat measuring 2.9 cm. Musculoskeletal: No aggressive lytic or sclerotic bone lesions. IMPRESSION: 1. Multifocal liver metastasis are again noted. Difficult to compare with previous studies performed without IV contrast material. Favor mild progression of liver lesions. 2. New right lower lobe pulmonary nodule worrisome for metastatic disease. 3. New increase caliber of the common bile duct. There may be a small 4 mm stone at the ampulla. Consider further evaluation with MRI/MRCP. 4.  Aortic Atherosclerosis (ICD10-I70.0). Electronically Signed   By: Kerby Moors M.D.   On: 01/21/2018 15:35        Thanks   LOS: 0 days   _0  E. Carlean Purl, MD, [facility] @  01/21/2018, 5:52 PM

## 2018-01-21 NOTE — H&P (Signed)
History and Physical    Benjamin Blake PYP:[id number] DOB: Feb 09, 1951 DOA: 01/21/2018  PCP: Lujean Amel, MD   Patient coming from: home     Chief Complaint: N/V, abdominal pain  HPI: Benjamin Blake is a 67 y.o. male with medical history significant of HLD and metastatic colon adenocarcinoma that is post 2 colectomies and multiple rounds of chemotherapy currently on roegorafenib who comes in with nausea vomiting and abdominal pain.  Patient reports that this morning he woke up and ate breakfast.  Approximately 3 hours afterwards he had severe upper abdominal pain.  Pain was sharp and nonradiating.  Pain did not wax and wane.  Nothing made the pain better.  Patient had one episode of nonbilious nonbloody emesis.  He denies any diarrhea, constipation, fevers, chest pain, shortness of breath, lower extremity edema, rash, syncope.   ED Course: In the ED patient was notable to have heart rate in the 100s.  Blood pressure was 245/80 systolic.Labs were notable for elevated AST and ALT, alkaline phosphatase, T bili of 6.0.  GI was consulted and recommended MRCP.  CT of the abdomen showed possible small 4 mm stone in the ampulla and dilated common bile duct.  Patient was given IV Zosyn and blood cultures were taken prior to IV Zosyn.  He was admitted for  Review of Systems: As per HPI otherwise 10 point review of systems negative.    Past Medical History:  Diagnosis Date  . AKI (acute kidney injury) ([place]) 02/2015  . Colon cancer metastasized to liver [facility]) 2010   initial Dx and surgical resection 2010, recurrent with liver, lung, peritoneal mets 2015.  treated with chemo at [facility].    . Coronary artery calcification seen on CAT scan 03/08/2015  . Dysphagia 02/2015   dysphagia 1 diet.  s/p FEES  . Gallbladder sludge 2016    Past Surgical History:  Procedure Laterality Date  . COLON SURGERY  2010   resection of colon cancer in [place].       reports that  has never smoked. he has never used smokeless  tobacco. He reports that he drinks alcohol. He reports that he does not use drugs.  No Known Allergies  Family History  Problem Relation Age of Onset  . Cancer - Colon Mother   . Heart disease Father    Unacceptable: Noncontributory, unremarkable, or negative. Acceptable: Family history reviewed and not pertinent (If you reviewed it)  Prior to Admission medications   Medication Sig Start Date End Date Taking? Authorizing Provider  calcium elemental as carbonate (TUMS ULTRA 1000) 400 MG chewable tablet Chew 400 mg by mouth as needed.   Yes [provider]  Cetirizine HCl (ZYRTEC ALLERGY) 10 MG CAPS Take 10 mg by mouth daily.   Yes [provider]  loperamide (IMODIUM) 2 MG capsule Take 2 mg by mouth as needed.   Yes [provider]  Multiple Vitamin (MULTIVITAMIN) capsule Take 1 capsule by mouth daily.   Yes [provider]  ondansetron (ZOFRAN) 8 MG tablet Take 1 tablet (8 mg total) by mouth every 8 (eight) hours as needed for nausea or vomiting. 02/27/15  Yes Thurnell Lose, MD  rosuvastatin (CRESTOR) 10 MG tablet Take 10 mg by mouth daily. 09/08/14  Yes [provider]  sildenafil (VIAGRA) 25 MG tablet Take 25 mg by mouth daily as needed. 09/26/17  Yes [provider]  STIVARGA 40 MG tablet Take 40 mg by mouth daily. 12/31/17  Yes [provider]  vitamin  B-12 (CYANOCOBALAMIN) 100 MCG tablet Take 100 mcg by mouth daily.   Yes [provider]  aspirin EC 81 MG tablet Take 1 tablet (81 mg total) by mouth daily. Patient not taking: Reported on 07/04/2017 03/06/15   Croitoru, Dani Gobble, MD  ciprofloxacin (CIPRO) 250 MG tablet Take 1 tablet (250 mg total) by mouth 2 (two) times daily. Patient not taking: Reported on 07/04/2017 11/01/15   Vivi Barrack, MD    Physical Exam: Vitals:   01/21/18 1515 01/21/18 1530 01/21/18 1545 01/21/18 1600  BP: (!) 180/95 (!) 168/87  (!) 170/90  Pulse: (!) 105 (!) 105 (!) 112 (!) 110    Resp: (!) 26 (!) 21 (!) 22 19  Temp:      SpO2:      Weight:      Height:        Constitutional: NAD, calm, comfortable Vitals:   01/21/18 1515 01/21/18 1530 01/21/18 1545 01/21/18 1600  BP: (!) 180/95 (!) 168/87  (!) 170/90  Pulse: (!) 105 (!) 105 (!) 112 (!) 110  Resp: (!) 26 (!) 21 (!) 22 19  Temp:      SpO2:      Weight:      Height:       Eyes: Scleral icterus ENMT: Dry mucous membranes Neck: normal, supple Respiratory: clear to auscultation bilaterally, no wheezing, no crackles. Normal respiratory effort. No accessory muscle use.  Cardiovascular: Rate and rhythm, no murmurs Abdomen: Soft, mildly tender to diffuse palpation, no right upper quadrant tenderness.  Musculoskeletal: no lower extremity edema Skin: no rashes skin Neurologic: Grossly intact moving all extremities Psychiatric: Normal judgment and insight. Alert and oriented x 3. Normal mood.   (Anything < 9 systems with 2 bullets each down codes to level 1) (If patient refuses exam can't bill higher level) (Make sure to document decubitus ulcers present on admission -- if possible -- and whether patient has chronic indwelling catheter at time of admission)  Labs on Admission: I have personally reviewed following labs and imaging studies  CBC: Recent Labs  Lab 01/21/18 1307  WBC 8.2  HGB 17.3*  HCT 49.7  MCV 89.4  PLT 71*   Basic Metabolic Panel: Recent Labs  Lab 01/21/18 1307  NA 138  K 3.9  CL 104  CO2 24  GLUCOSE 171*  BUN 10  CREATININE 0.93  CALCIUM 8.6*   GFR: Estimated Creatinine Clearance: 77.6 mL/min (by C-G formula based on SCr of 0.93 mg/dL). Liver Function Tests: Recent Labs  Lab 01/21/18 1307  AST 122*  ALT 92*  ALKPHOS 244*  BILITOT 6.0*  PROT 6.7  ALBUMIN 3.0*   Recent Labs  Lab 01/21/18 1307  LIPASE 65*   No results for input(s): AMMONIA in the last 168 hours. Coagulation Profile: No results for input(s): INR, PROTIME in the last 168 hours. Cardiac  Enzymes: No results for input(s): CKTOTAL, CKMB, CKMBINDEX, TROPONINI in the last 168 hours. BNP (last 3 results) No results for input(s): PROBNP in the last 8760 hours. HbA1C: No results for input(s): HGBA1C in the last 72 hours. CBG: No results for input(s): GLUCAP in the last 168 hours. Lipid Profile: No results for input(s): CHOL, HDL, LDLCALC, TRIG, CHOLHDL, LDLDIRECT in the last 72 hours. Thyroid Function Tests: No results for input(s): TSH, T4TOTAL, FREET4, T3FREE, THYROIDAB in the last 72 hours. Anemia Panel: No results for input(s): VITAMINB12, FOLATE, FERRITIN, TIBC, IRON, RETICCTPCT in the last 72 hours. Urine analysis:    Component Value Date/Time  COLORURINE AMBER (A) 01/21/2018 1508   APPEARANCEUR CLEAR 01/21/2018 1508   LABSPEC >1.046 (H) 01/21/2018 1508   PHURINE 5.0 01/21/2018 1508   GLUCOSEU NEGATIVE 01/21/2018 1508   HGBUR NEGATIVE 01/21/2018 1508   BILIRUBINUR SMALL (A) 01/21/2018 1508   KETONESUR NEGATIVE 01/21/2018 1508   PROTEINUR NEGATIVE 01/21/2018 1508   UROBILINOGEN 1.0 02/18/2015 1313   NITRITE NEGATIVE 01/21/2018 1508   LEUKOCYTESUR NEGATIVE 01/21/2018 1508    Radiological Exams on Admission: Ct Abdomen Pelvis W Contrast  Result Date: 01/21/2018 CLINICAL DATA:  Lower abdominal pain.  History of colon cancer. EXAM: CT ABDOMEN AND PELVIS WITH CONTRAST TECHNIQUE: Multidetector CT imaging of the abdomen and pelvis was performed using the standard protocol following bolus administration of intravenous contrast. CONTRAST:  119mL ISOVUE-300 IOPAMIDOL (ISOVUE-300) INJECTION 61% COMPARISON:  02/18/2015 and CT chest from 02/18/2015 FINDINGS: Lower chest: No pleural effusions. Right lower lobe pulmonary nodule is new from the previous exam measuring 1 cm, image 3 of series 4. Hepatobiliary: Multifocal liver metastasis are again noted. These are difficult to compare with previous exam performed without IV contrast. Lesion within left lobe of liver measures 1.9 by  2.1 cm, image 21 of series 3. Previously this measured approximately 1.9 x 1.2 cm. Posterior right lobe of liver lesion measures 1.1 by 1.0 cm, image 26 of series 3. Previously this measured 1 by 0.7 cm. Segment 6 liver metastasis measures 2.6 by 2.2 cm, image 35 of series 3. On the previous exam this measured 1.2 by 1.3 cm. Tiny stone within the dependent portion of the gallbladder. Increase caliber of the CBD measuring 1.2 cm. There may be a tiny stone at the level of the ampulla measuring approximately 4 mm. New from previous exam. Pancreas: Normal appearance of the pancreas. Spleen: The spleen measures 13.5 by 9.9 by 4.4 cm (volume = 310 cm^3). Adrenals/Urinary Tract: The adrenal glands are normal. Small right kidney cysts noted. No kidney mass or hydronephrosis identified. Urinary bladder appears normal. Stomach/Bowel: The stomach appears unremarkable. No abnormal small bowel dilatation identified. Status post right hemicolectomy with enterocolonic anastomosis. No pathologic dilatation of the remaining portions of the colon. The wall of the sigmoid colon appears thickened which is favored to reflect incomplete distention. No surrounding inflammatory changes. Distal colonic diverticula noted without findings to suggest acute diverticulitis. Vascular/Lymphatic: Aortic atherosclerosis. No aneurysm. Small paraesophageal varices identified. The portal vein remains patent. The hepatic veins are also patent. No abdominal or pelvic adenopathy identified. Reproductive: Prostate is unremarkable. Other: No free fluid or fluid collections within the abdomen or pelvis. Supraumbilical ventral abdominal wall hernia contains fat measuring 2.9 cm. Musculoskeletal: No aggressive lytic or sclerotic bone lesions. IMPRESSION: 1. Multifocal liver metastasis are again noted. Difficult to compare with previous studies performed without IV contrast material. Favor mild progression of liver lesions. 2. New right lower lobe pulmonary  nodule worrisome for metastatic disease. 3. New increase caliber of the common bile duct. There may be a small 4 mm stone at the ampulla. Consider further evaluation with MRI/MRCP. 4.  Aortic Atherosclerosis (ICD10-I70.0). Electronically Signed   By: Kerby Moors M.D.   On: 01/21/2018 15:35    EKG: Independently reviewed.  Sinus tachycardia no acute ST segment changes  Assessment/Plan Active Problems:   Colon cancer, metastatic   Abdominal pain   Cholestasis    ##) Choledocholithiasis versus biliary sludge: Labs are very suggestive of focal obstruction in the biliary tree. -Continue Zosyn -GI consult -MRCP pending -N.p.o. -IV fluids  ##) metastatic colon cancer:  Discussed with patient[facility]ist [facility] Dr. Reynaldo Minium who recommended holding Stivarga  -recommendation to restart chemotherapy after patient calls primary oncologist after discharge  ##) Hyperlipidemia: - Hold statin in the setting of mild liver dysfunction  Fluids: IV fluids Electrolyte: Monitor and supplement Nutrition: N.p.o. pending possible ERCP  Prophylaxis: SCDs  Disposition: Pending possible ERCP  Cristy Folks MD [facility]  If 7PM-7AM, please contact night-coverage [email or web address] Password [facility]  01/21/2018, 4:40 PM

## 2018-01-21 NOTE — ED Triage Notes (Signed)
Pt states that he is having lower abd pain (equal on both sides).  Started this morning.  Pt on chemo for colon cancer.  Pt vomited once this morning.  Denies stool problems.  Last BM last night.

## 2018-01-22 ENCOUNTER — Encounter (HOSPITAL_COMMUNITY): Payer: Self-pay | Admitting: *Deleted

## 2018-01-22 ENCOUNTER — Inpatient Hospital Stay (HOSPITAL_COMMUNITY): Payer: BLUE CROSS/BLUE SHIELD | Admitting: Certified Registered Nurse Anesthetist

## 2018-01-22 ENCOUNTER — Encounter (HOSPITAL_COMMUNITY)
Admission: EM | Disposition: A | Discharge status: Home / Self Care | Payer: Self-pay | Source: Home / Self Care | Attending: Family Medicine

## 2018-01-22 ENCOUNTER — Inpatient Hospital Stay (HOSPITAL_COMMUNITY): Payer: BLUE CROSS/BLUE SHIELD

## 2018-01-22 DIAGNOSIS — R112 Nausea with vomiting, unspecified: Secondary | ICD-10-CM

## 2018-01-22 DIAGNOSIS — K8050 Calculus of bile duct without cholangitis or cholecystitis without obstruction: Secondary | ICD-10-CM

## 2018-01-22 DIAGNOSIS — R17 Unspecified jaundice: Secondary | ICD-10-CM

## 2018-01-22 DIAGNOSIS — C189 Malignant neoplasm of colon, unspecified: Secondary | ICD-10-CM

## 2018-01-22 DIAGNOSIS — R740 Nonspecific elevation of levels of transaminase and lactic acid dehydrogenase [LDH]: Secondary | ICD-10-CM

## 2018-01-22 HISTORY — PX: ERCP: SHX5425

## 2018-01-22 LAB — CBC
HCT: 46.8 % (ref 39.0–52.0)
Hemoglobin: 15.8 g/dL (ref 13.0–17.0)
MCH: 30.4 pg (ref 26.0–34.0)
MCHC: 33.8 g/dL (ref 30.0–36.0)
MCV: 90.2 fL (ref 78.0–100.0)
Platelets: 75 K/uL — ABNORMAL LOW (ref 150–400)
RBC: 5.19 MIL/uL (ref 4.22–5.81)
RDW: 15.9 % — ABNORMAL HIGH (ref 11.5–15.5)
WBC: 21.3 10*3/uL — ABNORMAL HIGH (ref 4.0–10.5)

## 2018-01-22 LAB — COMPREHENSIVE METABOLIC PANEL WITH GFR
ALT: 90 U/L — ABNORMAL HIGH (ref 17–63)
AST: 120 U/L — ABNORMAL HIGH (ref 15–41)
Alkaline Phosphatase: 170 U/L — ABNORMAL HIGH (ref 38–126)
BUN: 11 mg/dL (ref 6–20)
CO2: 23 mmol/L (ref 22–32)
Calcium: 8.4 mg/dL — ABNORMAL LOW (ref 8.9–10.3)
Chloride: 104 mmol/L (ref 101–111)
Creatinine, Ser: 1.6 mg/dL — ABNORMAL HIGH (ref 0.61–1.24)
GFR calc Af Amer: 51 mL/min — ABNORMAL LOW (ref >60)
Sodium: 140 mmol/L (ref 135–145)

## 2018-01-22 LAB — COMPREHENSIVE METABOLIC PANEL
Albumin: 2.6 g/dL — ABNORMAL LOW (ref 3.5–5.0)
Anion gap: 13 (ref 5–15)
GFR calc non Af Amer: 44 mL/min — ABNORMAL LOW (ref >60)
Glucose, Bld: 130 mg/dL — ABNORMAL HIGH (ref 65–99)
Potassium: 4.7 mmol/L (ref 3.5–5.1)
Total Bilirubin: 9.2 mg/dL — ABNORMAL HIGH (ref 0.3–1.2)
Total Protein: 6.2 g/dL — ABNORMAL LOW (ref 6.5–8.1)

## 2018-01-22 LAB — HIV ANTIBODY (ROUTINE TESTING W REFLEX): HIV Screen 4th Generation wRfx: NONREACTIVE

## 2018-01-22 LAB — MAGNESIUM: Magnesium: 1.3 mg/dL — ABNORMAL LOW (ref 1.7–2.4)

## 2018-01-22 LAB — PROTIME-INR
INR: 1.13
Prothrombin Time: 14.4 seconds (ref 11.4–15.2)

## 2018-01-22 SURGERY — ERCP, WITH INTERVENTION IF INDICATED
Anesthesia: General

## 2018-01-22 SURGERY — CANCELLED PROCEDURE

## 2018-01-22 MED ORDER — SODIUM CHLORIDE 0.9 % IV SOLN
INTRAVENOUS | Status: DC | PRN
  Administered 2018-01-22: 30 mL

## 2018-01-22 MED ORDER — SUGAMMADEX SODIUM 200 MG/2ML IV SOLN
INTRAVENOUS | Status: DC | PRN
  Administered 2018-01-22: 322.8 mg via INTRAVENOUS

## 2018-01-22 MED ORDER — PHENYLEPHRINE HCL 10 MG/ML IJ SOLN
INTRAMUSCULAR | Status: DC | PRN
  Administered 2018-01-22 (×5): 80 ug via INTRAVENOUS

## 2018-01-22 MED ORDER — LACTATED RINGERS IV SOLN
INTRAVENOUS | Status: DC
  Administered 2018-01-22: 1000 mL via INTRAVENOUS

## 2018-01-22 MED ORDER — LIDOCAINE 2% (20 MG/ML) 5 ML SYRINGE
INTRAMUSCULAR | Status: DC | PRN
  Administered 2018-01-22: 40 mg via INTRAVENOUS

## 2018-01-22 MED ORDER — EPHEDRINE SULFATE 50 MG/ML IJ SOLN
INTRAMUSCULAR | Status: DC | PRN
  Administered 2018-01-22: 5 mg via INTRAVENOUS

## 2018-01-22 MED ORDER — LACTATED RINGERS IV SOLN
INTRAVENOUS | Status: DC | PRN
  Administered 2018-01-22: 14:00:00 via INTRAVENOUS

## 2018-01-22 MED ORDER — PIPERACILLIN-TAZOBACTAM 3.375 G IVPB
3.3750 g | Freq: Three times a day (TID) | INTRAVENOUS | Status: DC
  Administered 2018-01-22 – 2018-01-23 (×3): 3.375 g via INTRAVENOUS
  Filled 2018-01-22 (×5): qty 50

## 2018-01-22 MED ORDER — INDOMETHACIN 50 MG RE SUPP
RECTAL | Status: DC | PRN
  Administered 2018-01-22: 100 mg via RECTAL

## 2018-01-22 MED ORDER — ROCURONIUM BROMIDE 10 MG/ML (PF) SYRINGE
PREFILLED_SYRINGE | INTRAVENOUS | Status: DC | PRN
  Administered 2018-01-22: 50 mg via INTRAVENOUS

## 2018-01-22 MED ORDER — PHENYLEPHRINE HCL 10 MG/ML IJ SOLN: INTRAVENOUS | Status: DC | PRN

## 2018-01-22 MED ORDER — IOPAMIDOL (ISOVUE-300) INJECTION 61%
INTRAVENOUS | Status: AC
  Filled 2018-01-22: qty 50

## 2018-01-22 MED ORDER — ONDANSETRON HCL 4 MG/2ML IJ SOLN
INTRAMUSCULAR | Status: DC | PRN
  Administered 2018-01-22: 4 mg via INTRAVENOUS

## 2018-01-22 MED ORDER — DEXAMETHASONE SODIUM PHOSPHATE 10 MG/ML IJ SOLN
INTRAMUSCULAR | Status: DC | PRN
  Administered 2018-01-22: 10 mg via INTRAVENOUS

## 2018-01-22 MED ORDER — FENTANYL CITRATE (PF) 250 MCG/5ML IJ SOLN
INTRAMUSCULAR | Status: DC | PRN
  Administered 2018-01-22: 100 ug via INTRAVENOUS

## 2018-01-22 MED ORDER — PROPOFOL 10 MG/ML IV BOLUS
INTRAVENOUS | Status: DC | PRN
  Administered 2018-01-22: 200 mg via INTRAVENOUS

## 2018-01-22 MED ORDER — GLUCAGON HCL RDNA (DIAGNOSTIC) 1 MG IJ SOLR
INTRAMUSCULAR | Status: AC
  Filled 2018-01-22: qty 1

## 2018-01-22 MED ORDER — INDOMETHACIN 50 MG RE SUPP
RECTAL | Status: AC
  Filled 2018-01-22: qty 2

## 2018-01-22 NOTE — Progress Notes (Signed)
Progress Note   Assessment / Plan:   Assessment: 1.  Choledocholithiasis and cholelithiasis: 2 stones seen in the distal common bile duct on MRCP yesterday 2.  Metastatic colon cancer: to the liver and lung on chemotherapy with Regorafenib, followed by Dr. Verlan Friends at [place]: 1.  Plan for ERCP today with Dr. Carlean Purl.  Did review risks, benefits, limitations and alternatives and patient agrees to proceed.  This is scheduled for this afternoon. 2.  Remain n.p.o. until after time of procedure 3.  Continue supportive measures 4.  Please await any further recommendations from Dr. Carlean Purl after procedure today  Thank you for your kind consultation, will continue to follow.   LOS: 1 day   Levin Erp  01/22/2018, 9:26 AM  Pager # [phone]   Subjective  Chief Complaint: Choledocholithiasis, Metastatic Colon Ca  This morning the patient was found comfortably lying in bed with his wife by his side.  They tell me that it will be the 41st wedding anniversary on Monday,  he was having some right upper quadrant pain which radiated through to his back but overnight this stopped.  He is hungry as he has not been allowed to eat anything today.  No new complaints.    Objective   Vital signs in last 24 hours: Temp:  [97.6 F (36.4 C)-99.9 F (37.7 C)] 98.6 F (37 C) (02/16 0503) Pulse Rate:  [74-124] 79 (02/16 0503) Resp:  [17-26] 18 (02/16 0503) BP: (92-180)/(63-103) 92/63 (02/16 0503) SpO2:  [95 %-100 %] 97 % (02/16 0503) Weight:  [178 lb (80.7 kg)] 178 lb (80.7 kg) (02/15 1307) Last BM Date: 01/21/18 General:    Jaundiced White male in NAD Heart:  Regular rate and rhythm; no murmurs Lungs: Respirations even and unlabored, lungs CTA bilaterally Abdomen:  Soft, mild RUQ ttp and nondistended. Normal bowel sounds. Extremities:  Without edema. Neurologic:  Alert and oriented,  grossly normal neurologically. Psych:  Cooperative. Normal mood and affect.  Intake/Output  from previous day: 02/15 0701 - 02/16 0700 In: 1658.3 [I.V.:608.3; IV Piggyback:1050] Out: 500 [Urine:500]  Lab Results: Recent Labs    01/21/18 1307 01/22/18 0354  WBC 8.2 21.3*  HGB 17.3* 15.8  HCT 49.7 46.8  PLT 71* 75*   BMET Recent Labs    01/21/18 1307 01/22/18 0354  NA 138 140  K 3.9 4.7  CL 104 104  CO2 24 23  GLUCOSE 171* 130*  BUN 10 11  CREATININE 0.93 1.60*  CALCIUM 8.6* 8.4*   LFT Recent Labs    01/22/18 0354  PROT 6.2*  ALBUMIN 2.6*  AST 120*  ALT 90*  ALKPHOS 170*  BILITOT 9.2*   PT/INR Recent Labs    01/22/18 0354  LABPROT 14.4  INR 1.13    Studies/Results: Ct Abdomen Pelvis W Contrast  Result Date: 01/21/2018 CLINICAL DATA:  Lower abdominal pain.  History of colon cancer. EXAM: CT ABDOMEN AND PELVIS WITH CONTRAST TECHNIQUE: Multidetector CT imaging of the abdomen and pelvis was performed using the standard protocol following bolus administration of intravenous contrast. CONTRAST:  17mL ISOVUE-300 IOPAMIDOL (ISOVUE-300) INJECTION 61% COMPARISON:  02/18/2015 and CT chest from 02/18/2015 FINDINGS: Lower chest: No pleural effusions. Right lower lobe pulmonary nodule is new from the previous exam measuring 1 cm, image 3 of series 4. Hepatobiliary: Multifocal liver metastasis are again noted. These are difficult to compare with previous exam performed without IV contrast. Lesion within left lobe of liver measures 1.9 by 2.1 cm, image 21  of series 3. Previously this measured approximately 1.9 x 1.2 cm. Posterior right lobe of liver lesion measures 1.1 by 1.0 cm, image 26 of series 3. Previously this measured 1 by 0.7 cm. Segment 6 liver metastasis measures 2.6 by 2.2 cm, image 35 of series 3. On the previous exam this measured 1.2 by 1.3 cm. Tiny stone within the dependent portion of the gallbladder. Increase caliber of the CBD measuring 1.2 cm. There may be a tiny stone at the level of the ampulla measuring approximately 4 mm. New from previous exam.  Pancreas: Normal appearance of the pancreas. Spleen: The spleen measures 13.5 by 9.9 by 4.4 cm (volume = 310 cm^3). Adrenals/Urinary Tract: The adrenal glands are normal. Small right kidney cysts noted. No kidney mass or hydronephrosis identified. Urinary bladder appears normal. Stomach/Bowel: The stomach appears unremarkable. No abnormal small bowel dilatation identified. Status post right hemicolectomy with enterocolonic anastomosis. No pathologic dilatation of the remaining portions of the colon. The wall of the sigmoid colon appears thickened which is favored to reflect incomplete distention. No surrounding inflammatory changes. Distal colonic diverticula noted without findings to suggest acute diverticulitis. Vascular/Lymphatic: Aortic atherosclerosis. No aneurysm. Small paraesophageal varices identified. The portal vein remains patent. The hepatic veins are also patent. No abdominal or pelvic adenopathy identified. Reproductive: Prostate is unremarkable. Other: No free fluid or fluid collections within the abdomen or pelvis. Supraumbilical ventral abdominal wall hernia contains fat measuring 2.9 cm. Musculoskeletal: No aggressive lytic or sclerotic bone lesions. IMPRESSION: 1. Multifocal liver metastasis are again noted. Difficult to compare with previous studies performed without IV contrast material. Favor mild progression of liver lesions. 2. New right lower lobe pulmonary nodule worrisome for metastatic disease. 3. New increase caliber of the common bile duct. There may be a small 4 mm stone at the ampulla. Consider further evaluation with MRI/MRCP. 4.  Aortic Atherosclerosis (ICD10-I70.0). Electronically Signed   By: Kerby Moors M.D.   On: 01/21/2018 15:35   Mr 3d Recon At Scanner  Result Date: 01/21/2018 CLINICAL DATA:  New onset biliary dilatation in a patient with metastatic colon cancer. Question choledocholithiasis on CT scan earlier today. EXAM: MRI ABDOMEN WITHOUT AND WITH CONTRAST  (INCLUDING MRCP) TECHNIQUE: Multiplanar multisequence MR imaging of the abdomen was performed both before and after the administration of intravenous contrast. Heavily T2-weighted images of the biliary and pancreatic ducts were obtained, and three-dimensional MRCP images were rendered by post processing. CONTRAST:  43mL MULTIHANCE GADOBENATE DIMEGLUMINE 529 MG/ML IV SOLN COMPARISON:  None. FINDINGS: Lower chest: Enhancing nodules in the lower lungs suggests metastatic disease. Hepatobiliary: Multiple liver metastases again noted. 2.6 cm lesion identified inferior right liver (image 32 series 12/13/2000. 11 mm right liver lesion seen on image 24 the same series. There is a 2 cm left hepatic lesion on image 17. As noted on recent CT scan, there is intra and extrahepatic biliary duct dilatation. Extrahepatic common measures 15 mm diameter common bile duct in the head of the pancreas measures 11 mm. Tiny stone is identified in the neck of the gallbladder (image 19 series 9 2-3 mm stone is identified in the distal common bile duct, well seen on image 26 of series 9 possible 2-3 mm stone also at the ampulla (image 29 series 9). Pancreas: No focal mass lesion. No dilatation of the main duct. No intraparenchymal cyst. No peripancreatic edema. Spleen: No splenomegaly. 15 mm hypervascular subcapsular lesion identified in the spleen (image 28 series 1701) that is not visible on precontrast or delayed postcontrast  imaging. This is likely benign. Adrenals/Urinary Tract: No adrenal nodule or mass. 19 mm simple cyst identified upper pole right kidney. Tiny cyst noted upper pole left kidney. No suspicious or enhancing renal abnormality. Stomach/Bowel: Stomach is nondistended. No gastric wall thickening. No evidence of outlet obstruction. Duodenum is normally positioned as is the ligament of Treitz. No small bowel or colonic dilatation. Vascular/Lymphatic: No abdominal aortic aneurysm no abdominal lymphadenopathy. 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 Portal vein and superior mesenteric vein are patent. Other:  Mild perinephric edema bilaterally Musculoskeletal: No abnormal marrow enhancement within the visualized bony anatomy. IMPRESSION: 1. Intra and extrahepatic biliary duct dilatation with 2 tiny stones in the distal common bile duct. Additional tiny stone is seen in the neck of the gallbladder. 2. Hepatic metastases. 3. Lower lung nodules suspicious for metastatic involvement. 4. Renal cysts. Electronically Signed   By: Misty Stanley M.D.   On: 01/21/2018 20:56   Mr Abdomen Mrcp Moise Boring Contast  Result Date: 01/21/2018 CLINICAL DATA:  New onset biliary dilatation in a patient with metastatic colon cancer. Question choledocholithiasis on CT scan earlier today. EXAM: MRI ABDOMEN WITHOUT AND WITH CONTRAST (INCLUDING MRCP) TECHNIQUE: Multiplanar multisequence MR imaging of the abdomen was performed both before and after the administration of intravenous contrast. Heavily T2-weighted images of the biliary and pancreatic ducts were obtained, and three-dimensional MRCP images were rendered by post processing. CONTRAST:  64mL MULTIHANCE GADOBENATE DIMEGLUMINE 529 MG/ML IV SOLN COMPARISON:  None. FINDINGS: Lower chest: Enhancing nodules in the lower lungs suggests metastatic disease. Hepatobiliary: Multiple liver metastases again noted. 2.6 cm lesion identified inferior right liver (image 32 series 12/13/2000. 11 mm right liver lesion seen on image 24 the same series. There is a 2 cm left hepatic lesion on image 17. As noted on recent CT scan, there is intra and extrahepatic biliary duct dilatation. Extrahepatic common measures 15 mm diameter common bile duct in the head of the pancreas measures 11 mm. Tiny stone is identified in the neck of the gallbladder (image 19 series 9 2-3 mm stone is identified in the distal common bile duct, well seen on image 26 of series 9 possible 2-3 mm stone also at the ampulla (image 29  series 9). Pancreas: No focal mass lesion. No dilatation of the main duct. No intraparenchymal cyst. No peripancreatic edema. Spleen: No splenomegaly. 15 mm hypervascular subcapsular lesion identified in the spleen (image 28 series 1701) that is not visible on precontrast or delayed postcontrast imaging. This is likely benign. Adrenals/Urinary Tract: No adrenal nodule or mass. 19 mm simple cyst identified upper pole right kidney. Tiny cyst noted upper pole left kidney. No suspicious or enhancing renal abnormality. Stomach/Bowel: Stomach is nondistended. No gastric wall thickening. No evidence of outlet obstruction. Duodenum is normally positioned as is the ligament of Treitz. No small bowel or colonic dilatation. Vascular/Lymphatic: No abdominal aortic aneurysm no abdominal lymphadenopathy. 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 Portal vein and superior mesenteric vein are patent. Other:  Mild perinephric edema bilaterally Musculoskeletal: No abnormal marrow enhancement within the visualized bony anatomy. IMPRESSION: 1. Intra and extrahepatic biliary duct dilatation with 2 tiny stones in the distal common bile duct. Additional tiny stone is seen in the neck of the gallbladder. 2. Hepatic metastases. 3. Lower lung nodules suspicious for metastatic involvement. 4. Renal cysts. Electronically Signed   By: Misty Stanley M.D.   On: 01/21/2018 20:56

## 2018-01-22 NOTE — Anesthesia Postprocedure Evaluation (Signed)
Anesthesia Post Note  Patient: Benjamin Blake  Procedure(s) Performed: ENDOSCOPIC RETROGRADE CHOLANGIOPANCREATOGRAPHY (ERCP) ([name] )     Patient location during evaluation: PACU Anesthesia Type: General Level of consciousness: awake and alert, patient cooperative and oriented Pain management: pain level controlled Vital Signs Assessment: post-procedure vital signs reviewed and stable Respiratory status: spontaneous breathing, nonlabored ventilation and respiratory function stable Cardiovascular status: blood pressure returned to baseline and stable Postop Assessment: no apparent nausea or vomiting Anesthetic complications: no    Last Vitals:  Vitals:   01/22/18 1413 01/22/18 1514  BP: 117/60   Pulse: 98   Resp:    Temp: 36.8 C 36.8 C  SpO2: 99%     Last Pain:  Vitals:   01/22/18 1413  TempSrc: Oral  PainSc:                  Cynthie Garmon,E. Anthonie Lotito

## 2018-01-22 NOTE — Anesthesia Procedure Notes (Signed)
Procedure Name: Intubation Date/Time: 01/22/2018 2:32 PM Performed by: Clearnce Sorrel, CRNA Pre-anesthesia Checklist: Patient identified, Emergency Drugs available, Suction available, Patient being monitored and Timeout performed Patient Re-evaluated:Patient Re-evaluated prior to induction Oxygen Delivery Method: Circle system utilized Preoxygenation: Pre-oxygenation with 100% oxygen Induction Type: IV induction Ventilation: Mask ventilation without difficulty Laryngoscope Size: Mac and 3 Grade View: Grade II Tube type: Oral Tube size: 7.5 mm Number of attempts: 1 Airway Equipment and Method: Stylet Placement Confirmation: ETT inserted through vocal cords under direct vision,  positive ETCO2 and breath sounds checked- equal and bilateral Secured at: 23 cm Tube secured with: Tape Dental Injury: Teeth and Oropharynx as per pre-operative assessment

## 2018-01-22 NOTE — Interval H&P Note (Signed)
History and Physical Interval Note:  01/22/2018 2:14 PM  Benjamin Blake  has presented today for surgery, with the diagnosis of CBD stones  The various methods of treatment have been discussed with the patient and family. After consideration of risks, benefits and other options for treatment, the patient has consented to  Procedure(s): ENDOSCOPIC RETROGRADE CHOLANGIOPANCREATOGRAPHY (ERCP) ([name]) as a surgical intervention .  The patient's history has been reviewed, patient examined, no change in status, stable for surgery.  I have reviewed the patient's chart and labs.  Questions were answered to the patient's satisfaction.     Silvano Rusk

## 2018-01-22 NOTE — Op Note (Signed)
[facility] Patient Name: Benjamin Blake Procedure Date : 01/22/2018 MRN: [id number] Attending MD: Gatha Mayer , MD Date of Birth: 12/16/1950 CSN: [id number] Age: 67 Admit Type: Inpatient Procedure:                ERCP Indications:              Bile duct stone(s), For therapy of bile duct                            stone(s) Providers:                Gatha Mayer, MD, Cleda Daub, RN, William Dalton, Technician Referring MD:              Medicines:                General Anesthesia, Running IV Zosyn Complications:            No immediate complications. Estimated Blood Loss:     Estimated blood loss was minimal. Procedure:                Pre-Anesthesia Assessment:                           - Prior to the procedure, a History and Physical                            was performed, and patient medications and                            allergies were reviewed. The patient's tolerance of                            previous anesthesia was also reviewed. The risks                            and benefits of the procedure and the sedation                            options and risks were discussed with the patient.                            All questions were answered, and informed consent                            was obtained. Prior Anticoagulants: The patient has                            taken no previous anticoagulant or antiplatelet                            agents. ASA Grade Assessment: III - A patient with  severe systemic disease. After reviewing the risks                            and benefits, the patient was deemed in                            satisfactory condition to undergo the procedure.                           After obtaining informed consent, the scope was                            passed under direct vision. Throughout the                            procedure, the patient's blood pressure,  pulse, and                            oxygen saturations were monitored continuously. The                            [id number] scope was introduced through the                            mouth, and used to inject contrast into and used to                            inject contrast into the bile duct. The ERCP was                            accomplished without difficulty. The patient                            tolerated the procedure well. Scope In: Scope Out: Findings:      The scout film was normal. Esophagus not seen well except distal - NL.       NL stomach. NL bulb. Small diverticulum above a prominent and inflamed       major papilla in D2. Deep cannulation free w/o wire into CBD       accomplished and some ooze of blood with that. Wire placed - contrast       injected into diffusely dilated biliary tree (moderate) max 15 mm.       Single distal stone seen and extracted with balloon after biliary       sphincterotomy. Pigmented and rectanguloid. Subsequent occlusion       cholangiogram and sweep negative. No pancreatogram. Small ooze of blood       - nothing pulsatile. Impression:               - Choledocholithiasis was found. Complete removal                            was accomplished by biliary sphincterotomy and  balloon extraction.                           - Small periampullary diverticulum Recommendation:           - Return patient to hospital ward for ongoing care.                           - Clear liquid diet today.                           - Hold chemo still                           Labs in AM ordered                           If ok try low fat diet tomorrow and home if ok                           stay on Abx til tomorrow given cancer and chemotx                           Am not in favor of cholecystectomy since he has                            metasatic colon cancer and now has sphincterotomy Procedure Code(s):        ---  Professional ---                           [phone], Endoscopic retrograde                            cholangiopancreatography (ERCP); with removal of                            calculi/debris from biliary/pancreatic duct(s)                           43262, Endoscopic retrograde                            cholangiopancreatography (ERCP); with                            sphincterotomy/papillotomy Diagnosis Code(s):        --- Professional ---                           K80.50, Calculus of bile duct without cholangitis                            or cholecystitis without obstruction CPT copyright 2016 [employer]. All rights reserved. The codes documented in this report are preliminary and upon coder review may  be revised to meet current compliance requirements. Gatha Mayer, MD 01/22/2018 3:11:22 PM This report has been signed electronically. Number of Addenda: 0

## 2018-01-22 NOTE — Transfer of Care (Signed)
Immediate Anesthesia Transfer of Care Note  Patient: Benjamin Blake  Procedure(s) Performed: ENDOSCOPIC RETROGRADE CHOLANGIOPANCREATOGRAPHY (ERCP) ([name] )  Patient Location: Endoscopy Unit  Anesthesia Type:General  Level of Consciousness: awake, alert  and oriented  Airway & Oxygen Therapy: Patient Spontanous Breathing  Post-op Assessment: Report given to RN and Post -op Vital signs reviewed and stable  Post vital signs: Reviewed and stable  Last Vitals:  Vitals:   01/22/18 0503 01/22/18 1413  BP: 92/63 117/60  Pulse: 79 98  Resp: 18   Temp: 37 C 36.8 C  SpO2: 97% 99%    Last Pain:  Vitals:   01/22/18 1413  TempSrc: Oral  PainSc:          Complications: No apparent anesthesia complications

## 2018-01-22 NOTE — Anesthesia Preprocedure Evaluation (Addendum)
Anesthesia Evaluation  Patient identified by MRN, date of birth, ID band Patient awake    Reviewed: Allergy & Precautions, NPO status , Patient's Chart, lab work & pertinent test results  History of Anesthesia Complications Negative for: history of anesthetic complications  Airway Mallampati: I  TM Distance: >3 FB Neck ROM: Full    Dental  (+) Dental Advisory Given, Caps   Pulmonary  Lung mets: no resp symptoms   breath sounds clear to auscultation       Cardiovascular + CAD (possible coronary calcification)  + dysrhythmias (single episode) Atrial Fibrillation  Rhythm:Regular Rate:Normal  '16 myoview: normal perfusion, no ischemia, normal contractility   Neuro/Psych negative neurological ROS     GI/Hepatic Neg liver ROS, GERD  Medicated and Controlled,Mets to liver Colon cancer with liver mets   Endo/Other  negative endocrine ROS  Renal/GU Renal InsufficiencyRenal disease (creat 1.60)     Musculoskeletal   Abdominal   Peds  Hematology  (+) Blood dyscrasia (thrombocytopenia: plt 75K), ,   Anesthesia Other Findings   Reproductive/Obstetrics                           Anesthesia Physical Anesthesia Plan  ASA: III  Anesthesia Plan: General   Post-op Pain Management:    Induction: Intravenous  PONV Risk Score and Plan: 2 and Ondansetron and Dexamethasone  Airway Management Planned: Oral ETT  Additional Equipment:   Intra-op Plan:   Post-operative Plan: Extubation in OR  Informed Consent: I have reviewed the patients History and Physical, chart, labs and discussed the procedure including the risks, benefits and alternatives for the proposed anesthesia with the patient or authorized representative who has indicated his/her understanding and acceptance.   Dental advisory given  Plan Discussed with: CRNA and Surgeon  Anesthesia Plan Comments: (Plan routine monitors, GETA)         Anesthesia Quick Evaluation

## 2018-01-22 NOTE — Progress Notes (Signed)
PROGRESS NOTE  Letcher Schweikert  NGE:[id number] DOB: 1951-07-30 DOA: 01/21/2018 PCP: Lujean Amel, MD  Outpatient Specialists: [facility], Dr. Reynaldo Minium Brief Narrative: Davison Ohms is a 67 y.o. male with a history of metastatic colon CA s/p colectomy x2 and multiple cycles of chemotherapy currently on regorafenib who presented to the ED with intractable nausea and vomiting with sharp, severe, upper abdominal pain. LFTs were elevated and MRCP confirmed obstructive choledocholithiasis. ERCP 2/16 included successful extraction.   Assessment & Plan: Active Problems:   Colon cancer, metastatic   Abdominal pain   Cholestasis   Choledocholithiasis  Obstructive choledocholithiasis without cholecystitis: s/p successful biliary sphincterotomy and extraction 2/16.  - Monitor LFTs in AM - Continuing zosyn for now given immunocompromise - Clear liquids, advance as tolerated to low fat 2/17. - GI not recommending cholecystectomy since he has metasatic colon cancer and now has sphincterotomy  Metastatic colon cancer: Discussed with patient's neurologist at [facility] Dr. Reynaldo Minium who recommended holding Tat Momoli.  - Further recommendations per oncology as outpatient.   Hyperlipidemia: - Holding statin for now.  DVT prophylaxis: SCDs Code Status: Full Family Communication: Wife at bedside Disposition Plan: Home when stable, possibly 2/17.   Consultants:   GI, Dr. Carlean Purl  Procedures:   ERCP 01/22/2018 by Dr. Carlean Purl: Impression:                - Choledocholithiasis was found. Complete removal was accomplished by biliary sphincterotomy and balloon extraction. - Small periampullary diverticulum  Antimicrobials:  Zosyn  Subjective: Pain significantly improved with pain medications. No nausea or vomiting. No chest pain or dyspnea  Objective: Vitals:   01/22/18 1413 01/22/18 1514 01/22/18 1515 01/22/18 1530  BP: 117/60  108/67 112/64  Pulse: 98  97 83  Resp:   13 17  Temp: 98.2 F  (36.8 C) 98.2 F (36.8 C)  98.2 F (36.8 C)  TempSrc: Oral     SpO2: 99%  99% 97%  Weight:      Height:        Intake/Output Summary (Last 24 hours) at 01/22/2018 1634 Last data filed at 01/22/2018 1509 Gross per 24 hour  Intake 1408.33 ml  Output 750 ml  Net 658.33 ml   Filed Weights   01/21/18 1302 01/21/18 1307  Weight: 80.7 kg (178 lb) 80.7 kg (178 lb)    Gen: 67 y.o. male in no distress Pulm: Non-labored breathing room air. Clear to auscultation bilaterally.  CV: Regular rate and rhythm. No murmur, rub, or gallop. No JVD, no pedal edema. GI: Abdomen soft, only very mildly tender in epigastrium/RUQ without rebound or guarding. Non-distended, with normoactive bowel sounds. No organomegaly or masses felt. Ext: Warm, no deformities Skin: No rashes, lesions or ulcers Neuro: Alert and oriented. No focal neurological deficits. Psych: Judgement and insight appear normal. Mood & affect appropriate.   Data Reviewed: I have personally reviewed following labs and imaging studies  CBC: Recent Labs  Lab 01/21/18 1307 01/22/18 0354  WBC 8.2 21.3*  HGB 17.3* 15.8  HCT 49.7 46.8  MCV 89.4 90.2  PLT 71* 75*   Basic Metabolic Panel: Recent Labs  Lab 01/21/18 1307 01/22/18 0354  NA 138 140  K 3.9 4.7  CL 104 104  CO2 24 23  GLUCOSE 171* 130*  BUN 10 11  CREATININE 0.93 1.60*  CALCIUM 8.6* 8.4*  MG  --  1.3*   GFR: Estimated Creatinine Clearance: 45.1 mL/min (A) (by C-G formula based on SCr of 1.6 mg/dL (H)). Liver Function Tests: Recent  Labs  Lab 01/21/18 1307 01/22/18 0354  AST 122* 120*  ALT 92* 90*  ALKPHOS 244* 170*  BILITOT 6.0* 9.2*  PROT 6.7 6.2*  ALBUMIN 3.0* 2.6*   Recent Labs  Lab 01/21/18 1307  LIPASE 65*   No results for input(s): AMMONIA in the last 168 hours. Coagulation Profile: Recent Labs  Lab 01/22/18 0354  INR 1.13   Cardiac Enzymes: No results for input(s): CKTOTAL, CKMB, CKMBINDEX, TROPONINI in the last 168 hours. BNP (last 3  results) No results for input(s): PROBNP in the last 8760 hours. HbA1C: No results for input(s): HGBA1C in the last 72 hours. CBG: No results for input(s): GLUCAP in the last 168 hours. Lipid Profile: No results for input(s): CHOL, HDL, LDLCALC, TRIG, CHOLHDL, LDLDIRECT in the last 72 hours. Thyroid Function Tests: No results for input(s): TSH, T4TOTAL, FREET4, T3FREE, THYROIDAB in the last 72 hours. Anemia Panel: No results for input(s): VITAMINB12, FOLATE, FERRITIN, TIBC, IRON, RETICCTPCT in the last 72 hours. Urine analysis:    Component Value Date/Time   COLORURINE AMBER (A) 01/21/2018 1508   APPEARANCEUR CLEAR 01/21/2018 1508   LABSPEC >1.046 (H) 01/21/2018 1508   PHURINE 5.0 01/21/2018 1508   GLUCOSEU NEGATIVE 01/21/2018 1508   HGBUR NEGATIVE 01/21/2018 1508   BILIRUBINUR SMALL (A) 01/21/2018 1508   KETONESUR NEGATIVE 01/21/2018 1508   PROTEINUR NEGATIVE 01/21/2018 1508   UROBILINOGEN 1.0 02/18/2015 1313   NITRITE NEGATIVE 01/21/2018 1508   LEUKOCYTESUR NEGATIVE 01/21/2018 1508   Recent Results (from the past 240 hour(s))  Blood culture (routine x 2)     Status: None (Preliminary result)   Collection Time: 01/21/18  4:28 PM  Result Value Ref Range Status   Specimen Description BLOOD RIGHT ANTECUBITAL  Final   Special Requests   Final    BOTTLES DRAWN AEROBIC AND ANAEROBIC Blood Culture adequate volume   Culture   Final    NO GROWTH < 24 HOURS Performed at [facility], [place] [address] [place], [place] [address]    Report Status PENDING  Incomplete  Blood culture (routine x 2)     Status: None (Preliminary result)   Collection Time: 01/21/18  4:40 PM  Result Value Ref Range Status   Specimen Description BLOOD RIGHT HAND  Final   Special Requests IN PEDIATRIC BOTTLE Blood Culture adequate volume  Final   Culture   Final    NO GROWTH < 24 HOURS Performed at [facility], [place] [address] [place], [place] [address]    Report Status PENDING  Incomplete       Radiology Studies: Ct Abdomen Pelvis W Contrast  Result Date: 01/21/2018 CLINICAL DATA:  Lower abdominal pain.  History of colon cancer. EXAM: CT ABDOMEN AND PELVIS WITH CONTRAST TECHNIQUE: Multidetector CT imaging of the abdomen and pelvis was performed using the standard protocol following bolus administration of intravenous contrast. CONTRAST:  134mL ISOVUE-300 IOPAMIDOL (ISOVUE-300) INJECTION 61% COMPARISON:  02/18/2015 and CT chest from 02/18/2015 FINDINGS: Lower chest: No pleural effusions. Right lower lobe pulmonary nodule is new from the previous exam measuring 1 cm, image 3 of series 4. Hepatobiliary: Multifocal liver metastasis are again noted. These are difficult to compare with previous exam performed without IV contrast. Lesion within left lobe of liver measures 1.9 by 2.1 cm, image 21 of series 3. Previously this measured approximately 1.9 x 1.2 cm. Posterior right lobe of liver lesion measures 1.1 by 1.0 cm, image 26 of series 3. Previously this measured 1 by 0.7  cm. Segment 6 liver metastasis measures 2.6 by 2.2 cm, image 35 of series 3. On the previous exam this measured 1.2 by 1.3 cm. Tiny stone within the dependent portion of the gallbladder. Increase caliber of the CBD measuring 1.2 cm. There may be a tiny stone at the level of the ampulla measuring approximately 4 mm. New from previous exam. Pancreas: Normal appearance of the pancreas. Spleen: The spleen measures 13.5 by 9.9 by 4.4 cm (volume = 310 cm^3). Adrenals/Urinary Tract: The adrenal glands are normal. Small right kidney cysts noted. No kidney mass or hydronephrosis identified. Urinary bladder appears normal. Stomach/Bowel: The stomach appears unremarkable. No abnormal small bowel dilatation identified. Status post right hemicolectomy with enterocolonic anastomosis. No pathologic dilatation of the remaining portions of the colon. The wall of the sigmoid colon appears thickened which is favored to reflect incomplete distention. No  surrounding inflammatory changes. Distal colonic diverticula noted without findings to suggest acute diverticulitis. Vascular/Lymphatic: Aortic atherosclerosis. No aneurysm. Small paraesophageal varices identified. The portal vein remains patent. The hepatic veins are also patent. No abdominal or pelvic adenopathy identified. Reproductive: Prostate is unremarkable. Other: No free fluid or fluid collections within the abdomen or pelvis. Supraumbilical ventral abdominal wall hernia contains fat measuring 2.9 cm. Musculoskeletal: No aggressive lytic or sclerotic bone lesions. IMPRESSION: 1. Multifocal liver metastasis are again noted. Difficult to compare with previous studies performed without IV contrast material. Favor mild progression of liver lesions. 2. New right lower lobe pulmonary nodule worrisome for metastatic disease. 3. New increase caliber of the common bile duct. There may be a small 4 mm stone at the ampulla. Consider further evaluation with MRI/MRCP. 4.  Aortic Atherosclerosis (ICD10-I70.0). Electronically Signed   By: Kerby Moors M.D.   On: 01/21/2018 15:35   Mr 3d Recon At Scanner  Result Date: 01/21/2018 CLINICAL DATA:  New onset biliary dilatation in a patient with metastatic colon cancer. Question choledocholithiasis on CT scan earlier today. EXAM: MRI ABDOMEN WITHOUT AND WITH CONTRAST (INCLUDING MRCP) TECHNIQUE: Multiplanar multisequence MR imaging of the abdomen was performed both before and after the administration of intravenous contrast. Heavily T2-weighted images of the biliary and pancreatic ducts were obtained, and three-dimensional MRCP images were rendered by post processing. CONTRAST:  42mL MULTIHANCE GADOBENATE DIMEGLUMINE 529 MG/ML IV SOLN COMPARISON:  None. FINDINGS: Lower chest: Enhancing nodules in the lower lungs suggests metastatic disease. Hepatobiliary: Multiple liver metastases again noted. 2.6 cm lesion identified inferior right liver (image 32 series 12/13/2000. 11 mm  right liver lesion seen on image 24 the same series. There is a 2 cm left hepatic lesion on image 17. As noted on recent CT scan, there is intra and extrahepatic biliary duct dilatation. Extrahepatic common measures 15 mm diameter common bile duct in the head of the pancreas measures 11 mm. Tiny stone is identified in the neck of the gallbladder (image 19 series 9 2-3 mm stone is identified in the distal common bile duct, well seen on image 26 of series 9 possible 2-3 mm stone also at the ampulla (image 29 series 9). Pancreas: No focal mass lesion. No dilatation of the main duct. No intraparenchymal cyst. No peripancreatic edema. Spleen: No splenomegaly. 15 mm hypervascular subcapsular lesion identified in the spleen (image 28 series 1701) that is not visible on precontrast or delayed postcontrast imaging. This is likely benign. Adrenals/Urinary Tract: No adrenal nodule or mass. 19 mm simple cyst identified upper pole right kidney. Tiny cyst noted upper pole left kidney. No suspicious or enhancing renal  abnormality. Stomach/Bowel: Stomach is nondistended. No gastric wall thickening. No evidence of outlet obstruction. Duodenum is normally positioned as is the ligament of Treitz. No small bowel or colonic dilatation. Vascular/Lymphatic: No abdominal aortic aneurysm no abdominal lymphadenopathy. 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 Portal vein and superior mesenteric vein are patent. Other:  Mild perinephric edema bilaterally Musculoskeletal: No abnormal marrow enhancement within the visualized bony anatomy. IMPRESSION: 1. Intra and extrahepatic biliary duct dilatation with 2 tiny stones in the distal common bile duct. Additional tiny stone is seen in the neck of the gallbladder. 2. Hepatic metastases. 3. Lower lung nodules suspicious for metastatic involvement. 4. Renal cysts. Electronically Signed   By: Misty Stanley M.D.   On: 01/21/2018 20:56   Dg Ercp Biliary & Pancreatic  Ducts  Result Date: 01/22/2018 CLINICAL DATA:  Bile duct stone. EXAM: ERCP TECHNIQUE: Multiple spot images obtained with the fluoroscopic device and submitted for interpretation post-procedure. FLUOROSCOPY TIME:  Fluoroscopy Time:  1 minutes and 16 seconds Number of Acquired Spot Images: 7 COMPARISON:  MR 01/21/2018 FINDINGS: Cannulation and opacification of the biliary system. There are subtle filling defects in distal common bile duct that are suggestive for stones. Mild dilatation of the extrahepatic biliary system. Balloon sweep was performed for stone removal. No filling defects or stones on the final images. IMPRESSION: Choledocholithiasis and stone removal. These images were submitted for radiologic interpretation only. Please see the procedural report for the amount of contrast and the fluoroscopy time utilized. Electronically Signed   By: Markus Daft M.D.   On: 01/22/2018 15:44   Mr Abdomen Mrcp Moise Boring Contast  Result Date: 01/21/2018 CLINICAL DATA:  New onset biliary dilatation in a patient with metastatic colon cancer. Question choledocholithiasis on CT scan earlier today. EXAM: MRI ABDOMEN WITHOUT AND WITH CONTRAST (INCLUDING MRCP) TECHNIQUE: Multiplanar multisequence MR imaging of the abdomen was performed both before and after the administration of intravenous contrast. Heavily T2-weighted images of the biliary and pancreatic ducts were obtained, and three-dimensional MRCP images were rendered by post processing. CONTRAST:  71mL MULTIHANCE GADOBENATE DIMEGLUMINE 529 MG/ML IV SOLN COMPARISON:  None. FINDINGS: Lower chest: Enhancing nodules in the lower lungs suggests metastatic disease. Hepatobiliary: Multiple liver metastases again noted. 2.6 cm lesion identified inferior right liver (image 32 series 12/13/2000. 11 mm right liver lesion seen on image 24 the same series. There is a 2 cm left hepatic lesion on image 17. As noted on recent CT scan, there is intra and extrahepatic biliary duct  dilatation. Extrahepatic common measures 15 mm diameter common bile duct in the head of the pancreas measures 11 mm. Tiny stone is identified in the neck of the gallbladder (image 19 series 9 2-3 mm stone is identified in the distal common bile duct, well seen on image 26 of series 9 possible 2-3 mm stone also at the ampulla (image 29 series 9). Pancreas: No focal mass lesion. No dilatation of the main duct. No intraparenchymal cyst. No peripancreatic edema. Spleen: No splenomegaly. 15 mm hypervascular subcapsular lesion identified in the spleen (image 28 series 1701) that is not visible on precontrast or delayed postcontrast imaging. This is likely benign. Adrenals/Urinary Tract: No adrenal nodule or mass. 19 mm simple cyst identified upper pole right kidney. Tiny cyst noted upper pole left kidney. No suspicious or enhancing renal abnormality. Stomach/Bowel: Stomach is nondistended. No gastric wall thickening. No evidence of outlet obstruction. Duodenum is normally positioned as is the ligament of Treitz. No small bowel or colonic  dilatation. Vascular/Lymphatic: No abdominal aortic aneurysm no abdominal lymphadenopathy. 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 Portal vein and superior mesenteric vein are patent. Other:  Mild perinephric edema bilaterally Musculoskeletal: No abnormal marrow enhancement within the visualized bony anatomy. IMPRESSION: 1. Intra and extrahepatic biliary duct dilatation with 2 tiny stones in the distal common bile duct. Additional tiny stone is seen in the neck of the gallbladder. 2. Hepatic metastases. 3. Lower lung nodules suspicious for metastatic involvement. 4. Renal cysts. Electronically Signed   By: Misty Stanley M.D.   On: 01/21/2018 20:56    Scheduled Meds: . calcium carbonate  500 mg Oral BID  . indomethacin  100 mg Rectal To SSTC  . loratadine  10 mg Oral Daily  . multivitamin with minerals  1 tablet Oral Daily  . vitamin B-12  100 mcg Oral Daily    Continuous Infusions: . sodium chloride 125 mL/hr at 01/22/18 1627  . piperacillin-tazobactam (ZOSYN)  IV 3.375 g (01/22/18 1627)     LOS: 1 day   Time spent: 25 minutes.  Vance Gather, MD [facility] Pager [phone]  If 7PM-7AM, please contact night-coverage [email or web address] Password [facility] 01/22/2018, 4:34 PM

## 2018-01-23 ENCOUNTER — Other Ambulatory Visit: Payer: Self-pay | Admitting: Internal Medicine

## 2018-01-23 DIAGNOSIS — K8050 Calculus of bile duct without cholangitis or cholecystitis without obstruction: Secondary | ICD-10-CM

## 2018-01-23 LAB — CBC
HCT: 42 % (ref 39.0–52.0)
Hemoglobin: 14.7 g/dL (ref 13.0–17.0)
MCH: 30.9 pg (ref 26.0–34.0)
MCHC: 35 g/dL (ref 30.0–36.0)
MCV: 88.4 fL (ref 78.0–100.0)
PLATELETS: 58 10*3/uL — AB (ref 150–400)
RBC: 4.75 MIL/uL (ref 4.22–5.81)
RDW: 15.9 % — ABNORMAL HIGH (ref 11.5–15.5)
WBC: 12.1 10*3/uL — AB (ref 4.0–10.5)

## 2018-01-23 LAB — COMPREHENSIVE METABOLIC PANEL
ALT: 67 U/L — ABNORMAL HIGH (ref 17–63)
ANION GAP: 9 (ref 5–15)
AST: 76 U/L — ABNORMAL HIGH (ref 15–41)
Albumin: 2.2 g/dL — ABNORMAL LOW (ref 3.5–5.0)
Alkaline Phosphatase: 134 U/L — ABNORMAL HIGH (ref 38–126)
BILIRUBIN TOTAL: 7.3 mg/dL — AB (ref 0.3–1.2)
BUN: 17 mg/dL (ref 6–20)
CALCIUM: 7.8 mg/dL — AB (ref 8.9–10.3)
CO2: 21 mmol/L — ABNORMAL LOW (ref 22–32)
Chloride: 108 mmol/L (ref 101–111)
Creatinine, Ser: 1.08 mg/dL (ref 0.61–1.24)
GFR calc non Af Amer: >60 mL/min (ref >60)
Glucose, Bld: 131 mg/dL — ABNORMAL HIGH (ref 65–99)
Potassium: 4.1 mmol/L (ref 3.5–5.1)
Sodium: 138 mmol/L (ref 135–145)
TOTAL PROTEIN: 5.5 g/dL — AB (ref 6.5–8.1)

## 2018-01-23 LAB — LIPASE, BLOOD: LIPASE: 28 U/L (ref 11–51)

## 2018-01-23 NOTE — Discharge Summary (Signed)
Physician Discharge Summary  Benjamin Blake DJM:[id number] DOB: 05-17-1951 DOA: 01/21/2018  PCP: Benjamin Blake  Admit date: 01/21/2018 Discharge date: 01/23/2018  Admitted From: Home Disposition: Home   Recommendations for Outpatient Follow-up:  1. Follow up with PCP in 1-2 weeks 2. Follow up with GI, Dr. Carlean Blake 2/25 with repeat LFTs. 3. Follow up with [facility], Dr. Reynaldo Blake, to inform timing of restarting Blake.   Home Health: None Equipment/Devices: None Discharge Condition: Stable CODE STATUS: Full Diet recommendation: Low fat  Brief/Interim Summary: Benjamin Blake is a 67 y.o. male with a history of metastatic colon CA s/p colectomy x2 and multiple cycles of chemotherapy currently on Blake who presented to the ED with intractable nausea and vomiting with sharp, severe, upper abdominal pain. LFTs were elevated and MRCP confirmed obstructive choledocholithiasis. ERCP 2/16 included successful extraction. Diet was advanced successfully and LFTs trended downward. Antibiotics were discontinued 2/17 and the patient was instructed to call his oncologist to decide when to restart chemo.   Discharge Diagnoses:  Active Problems:   Colon cancer, metastatic   Abdominal pain   Cholestasis   Choledocholithiasis  Start low fat diet Home today No Abx Call oncologist tomorrow and ask when to restart chemo Labs (LFT) by me in 8 d (2/25) - ordered Avoid NSAID's ASA other than 81 mg ASA acetaminophen fine  Obstructive choledocholithiasis without cholecystitis: s/p successful biliary sphincterotomy and extraction 2/16. GI not recommending cholecystectomy since he has metasatic colon cancer and now has sphincterotomy - Monitor LFTs at follow up with GI. Per Benjamin Blake note 2/17 this has been ordered for 2/25.  - Will discontinue antibiotics on POD 1. - Continue low fat diet per GI recommendations. -  Avoid NSAIDs  Metastatic colon cancer:  - Discussed with patient's  oncologist at [facility] Dr. Reynaldo Blake who recommended holding Benjamin Blake.  - Further recommendations per oncology as outpatient.   Hyperlipidemia: - Plan to restart statin  Discharge Instructions Discharge Instructions    Discharge instructions   Complete by:  As directed    You were admitted with symptoms related to an obstructing gall stone in the bile duct which was removed. You are stable for discharge with the following recommendations:  - Low fat diet. - Avoid NSAIDs (ibuprofen, advil, aleve, naproxen, motrin, etc.). - Follow up 2/25 per Dr. Carlean Blake as below for recheck of labs. - Call Benjamin Blake office tomorrow for advice on restarting stivarga. Do not take this until advised to do so.   Increase activity slowly   Complete by:  As directed      Allergies as of 01/23/2018   No Known Allergies     Medication List    STOP taking these medications   aspirin EC 81 MG tablet   ciprofloxacin 250 MG tablet Commonly known as:  CIPRO   STIVARGA 40 MG tablet Generic drug:  Blake     TAKE these medications   loperamide 2 MG capsule Commonly known as:  IMODIUM Take 2 mg by mouth as needed.   multivitamin capsule Take 1 capsule by mouth daily.   ondansetron 8 MG tablet Commonly known as:  ZOFRAN Take 1 tablet (8 mg total) by mouth every 8 (eight) hours as needed for nausea or vomiting.   rosuvastatin 10 MG tablet Commonly known as:  CRESTOR Take 10 mg by mouth daily.   sildenafil 25 MG tablet Commonly known as:  VIAGRA Take 25 mg by mouth daily as needed.   TUMS ULTRA 1000 400 MG chewable tablet  Generic drug:  calcium elemental as carbonate Chew 400 mg by mouth as needed.   vitamin B-12 100 MCG tablet Commonly known as:  CYANOCOBALAMIN Take 100 mcg by mouth daily.   ZYRTEC ALLERGY 10 MG Caps Generic drug:  Cetirizine HCl Take 10 mg by mouth daily.      Follow-up Information    Benjamin Mayer, Blake Follow up on 01/31/2018.   Specialty:   Gastroenterology Why:  Go to lab in basement to recheck liver tests Contact information: [address] [address] [phone]        Benjamin Blake, [place], Blake. Schedule an appointment as soon as possible for a visit in 1 week(s).   Specialty:  Family Medicine Contact information: [place] [address] [phone]        Benjamin Blake. Call.   Specialty:  Internal Medicine Why:  for advice on when to restart stivarga.  Contact information: [facility] [address] [place] [address] [phone]          No Known Allergies  Consultations:  GI, Dr. Carlean Blake  Procedures/Studies: Ct Abdomen Pelvis W Contrast  Result Date: 01/21/2018 CLINICAL DATA:  Lower abdominal pain.  History of colon cancer. EXAM: CT ABDOMEN AND PELVIS WITH CONTRAST TECHNIQUE: Multidetector CT imaging of the abdomen and pelvis was performed using the standard protocol following bolus administration of intravenous contrast. CONTRAST:  120mL ISOVUE-300 IOPAMIDOL (ISOVUE-300) INJECTION 61% COMPARISON:  02/18/2015 and CT chest from 02/18/2015 FINDINGS: Lower chest: No pleural effusions. Right lower lobe pulmonary nodule is new from the previous exam measuring 1 cm, image 3 of series 4. Hepatobiliary: Multifocal liver metastasis are again noted. These are difficult to compare with previous exam performed without IV contrast. Lesion within left lobe of liver measures 1.9 by 2.1 cm, image 21 of series 3. Previously this measured approximately 1.9 x 1.2 cm. Posterior right lobe of liver lesion measures 1.1 by 1.0 cm, image 26 of series 3. Previously this measured 1 by 0.7 cm. Segment 6 liver metastasis measures 2.6 by 2.2 cm, image 35 of series 3. On the previous exam this measured 1.2 by 1.3 cm. Tiny stone within the dependent portion of the gallbladder. Increase caliber of the CBD measuring 1.2 cm. There may be a tiny stone at the level of the ampulla  measuring approximately 4 mm. New from previous exam. Pancreas: Normal appearance of the pancreas. Spleen: The spleen measures 13.5 by 9.9 by 4.4 cm (volume = 310 cm^3). Adrenals/Urinary Tract: The adrenal glands are normal. Small right kidney cysts noted. No kidney mass or hydronephrosis identified. Urinary bladder appears normal. Stomach/Bowel: The stomach appears unremarkable. No abnormal small bowel dilatation identified. Status post right hemicolectomy with enterocolonic anastomosis. No pathologic dilatation of the remaining portions of the colon. The wall of the sigmoid colon appears thickened which is favored to reflect incomplete distention. No surrounding inflammatory changes. Distal colonic diverticula noted without findings to suggest acute diverticulitis. Vascular/Lymphatic: Aortic atherosclerosis. No aneurysm. Small paraesophageal varices identified. The portal vein remains patent. The hepatic veins are also patent. No abdominal or pelvic adenopathy identified. Reproductive: Prostate is unremarkable. Other: No free fluid or fluid collections within the abdomen or pelvis. Supraumbilical ventral abdominal wall hernia contains fat measuring 2.9 cm. Musculoskeletal: No aggressive lytic or sclerotic bone lesions. IMPRESSION: 1. Multifocal liver metastasis are again noted. Difficult to compare with previous studies performed without IV contrast material. Favor mild progression of liver lesions. 2. New right  lower lobe pulmonary nodule worrisome for metastatic disease. 3. New increase caliber of the common bile duct. There may be a small 4 mm stone at the ampulla. Consider further evaluation with MRI/MRCP. 4.  Aortic Atherosclerosis (ICD10-I70.0). Electronically Signed   By: Kerby Moors M.D.   On: 01/21/2018 15:35   Mr 3d Recon At Scanner  Result Date: 01/21/2018 CLINICAL DATA:  New onset biliary dilatation in a patient with metastatic colon cancer. Question choledocholithiasis on CT scan earlier today.  EXAM: MRI ABDOMEN WITHOUT AND WITH CONTRAST (INCLUDING MRCP) TECHNIQUE: Multiplanar multisequence MR imaging of the abdomen was performed both before and after the administration of intravenous contrast. Heavily T2-weighted images of the biliary and pancreatic ducts were obtained, and three-dimensional MRCP images were rendered by post processing. CONTRAST:  41mL MULTIHANCE GADOBENATE DIMEGLUMINE 529 MG/ML IV SOLN COMPARISON:  None. FINDINGS: Lower chest: Enhancing nodules in the lower lungs suggests metastatic disease. Hepatobiliary: Multiple liver metastases again noted. 2.6 cm lesion identified inferior right liver (image 32 series 12/13/2000. 11 mm right liver lesion seen on image 24 the same series. There is a 2 cm left hepatic lesion on image 17. As noted on recent CT scan, there is intra and extrahepatic biliary duct dilatation. Extrahepatic common measures 15 mm diameter common bile duct in the head of the pancreas measures 11 mm. Tiny stone is identified in the neck of the gallbladder (image 19 series 9 2-3 mm stone is identified in the distal common bile duct, well seen on image 26 of series 9 possible 2-3 mm stone also at the ampulla (image 29 series 9). Pancreas: No focal mass lesion. No dilatation of the main duct. No intraparenchymal cyst. No peripancreatic edema. Spleen: No splenomegaly. 15 mm hypervascular subcapsular lesion identified in the spleen (image 28 series 1701) that is not visible on precontrast or delayed postcontrast imaging. This is likely benign. Adrenals/Urinary Tract: No adrenal nodule or mass. 19 mm simple cyst identified upper pole right kidney. Tiny cyst noted upper pole left kidney. No suspicious or enhancing renal abnormality. Stomach/Bowel: Stomach is nondistended. No gastric wall thickening. No evidence of outlet obstruction. Duodenum is normally positioned as is the ligament of Treitz. No small bowel or colonic dilatation. Vascular/Lymphatic: No abdominal aortic aneurysm no  abdominal lymphadenopathy. 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 Portal vein and superior mesenteric vein are patent. Other:  Mild perinephric edema bilaterally Musculoskeletal: No abnormal marrow enhancement within the visualized bony anatomy. IMPRESSION: 1. Intra and extrahepatic biliary duct dilatation with 2 tiny stones in the distal common bile duct. Additional tiny stone is seen in the neck of the gallbladder. 2. Hepatic metastases. 3. Lower lung nodules suspicious for metastatic involvement. 4. Renal cysts. Electronically Signed   By: Misty Stanley M.D.   On: 01/21/2018 20:56   Dg Ercp Biliary & Pancreatic Ducts  Result Date: 01/22/2018 CLINICAL DATA:  Bile duct stone. EXAM: ERCP TECHNIQUE: Multiple spot images obtained with the fluoroscopic device and submitted for interpretation post-procedure. FLUOROSCOPY TIME:  Fluoroscopy Time:  1 minutes and 16 seconds Number of Acquired Spot Images: 7 COMPARISON:  MR 01/21/2018 FINDINGS: Cannulation and opacification of the biliary system. There are subtle filling defects in distal common bile duct that are suggestive for stones. Mild dilatation of the extrahepatic biliary system. Balloon sweep was performed for stone removal. No filling defects or stones on the final images. IMPRESSION: Choledocholithiasis and stone removal. These images were submitted for radiologic interpretation only. Please see the procedural report for the  amount of contrast and the fluoroscopy time utilized. Electronically Signed   By: Markus Daft M.D.   On: 01/22/2018 15:44   Mr Abdomen Mrcp Moise Boring Contast  Result Date: 01/21/2018 CLINICAL DATA:  New onset biliary dilatation in a patient with metastatic colon cancer. Question choledocholithiasis on CT scan earlier today. EXAM: MRI ABDOMEN WITHOUT AND WITH CONTRAST (INCLUDING MRCP) TECHNIQUE: Multiplanar multisequence MR imaging of the abdomen was performed both before and after the administration of intravenous  contrast. Heavily T2-weighted images of the biliary and pancreatic ducts were obtained, and three-dimensional MRCP images were rendered by post processing. CONTRAST:  8mL MULTIHANCE GADOBENATE DIMEGLUMINE 529 MG/ML IV SOLN COMPARISON:  None. FINDINGS: Lower chest: Enhancing nodules in the lower lungs suggests metastatic disease. Hepatobiliary: Multiple liver metastases again noted. 2.6 cm lesion identified inferior right liver (image 32 series 12/13/2000. 11 mm right liver lesion seen on image 24 the same series. There is a 2 cm left hepatic lesion on image 17. As noted on recent CT scan, there is intra and extrahepatic biliary duct dilatation. Extrahepatic common measures 15 mm diameter common bile duct in the head of the pancreas measures 11 mm. Tiny stone is identified in the neck of the gallbladder (image 19 series 9 2-3 mm stone is identified in the distal common bile duct, well seen on image 26 of series 9 possible 2-3 mm stone also at the ampulla (image 29 series 9). Pancreas: No focal mass lesion. No dilatation of the main duct. No intraparenchymal cyst. No peripancreatic edema. Spleen: No splenomegaly. 15 mm hypervascular subcapsular lesion identified in the spleen (image 28 series 1701) that is not visible on precontrast or delayed postcontrast imaging. This is likely benign. Adrenals/Urinary Tract: No adrenal nodule or mass. 19 mm simple cyst identified upper pole right kidney. Tiny cyst noted upper pole left kidney. No suspicious or enhancing renal abnormality. Stomach/Bowel: Stomach is nondistended. No gastric wall thickening. No evidence of outlet obstruction. Duodenum is normally positioned as is the ligament of Treitz. No small bowel or colonic dilatation. Vascular/Lymphatic: No abdominal aortic aneurysm no abdominal lymphadenopathy. 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 Portal vein and superior mesenteric vein are patent. Other:  Mild perinephric edema bilaterally  Musculoskeletal: No abnormal marrow enhancement within the visualized bony anatomy. IMPRESSION: 1. Intra and extrahepatic biliary duct dilatation with 2 tiny stones in the distal common bile duct. Additional tiny stone is seen in the neck of the gallbladder. 2. Hepatic metastases. 3. Lower lung nodules suspicious for metastatic involvement. 4. Renal cysts. Electronically Signed   By: Misty Stanley M.D.   On: 01/21/2018 20:56    ERCP 01/22/2018 by Dr. Carlean Blake: Impression:  - Choledocholithiasis was found. Complete removal was accomplished by biliary sphincterotomy and balloon extraction. - Small periampullary diverticulum  Subjective: No pain, nausea or vomiting. Eager to go home. LFTs all improved overnight.   Discharge Exam: Vitals:   01/23/18 0533 01/23/18 1010  BP: 118/65 116/64  Pulse: 71 66  Resp: 17   Temp: 97.8 F (36.6 C) 98.1 F (36.7 C)  SpO2: 96% 98%   General: Pt is alert, awake, not in acute distress Cardiovascular: RRR, S1/S2 +, no rubs, no gallops Respiratory: CTA bilaterally, no wheezing, no rhonchi Abdominal: Soft, NT, ND, bowel sounds + Extremities: No edema, no cyanosis  Labs: Basic Metabolic Panel: Recent Labs  Lab 01/21/18 1307 01/22/18 0354 01/23/18 0409  NA 138 140 138  K 3.9 4.7 4.1  CL 104 104 108  CO2  24 23 21*  GLUCOSE 171* 130* 131*  BUN 10 11 17   CREATININE 0.93 1.60* 1.08  CALCIUM 8.6* 8.4* 7.8*  MG  --  1.3*  --    Liver Function Tests: Recent Labs  Lab 01/21/18 1307 01/22/18 0354 01/23/18 0409  AST 122* 120* 76*  ALT 92* 90* 67*  ALKPHOS 244* 170* 134*  BILITOT 6.0* 9.2* 7.3*  PROT 6.7 6.2* 5.5*  ALBUMIN 3.0* 2.6* 2.2*   Recent Labs  Lab 01/21/18 1307 01/23/18 0409  LIPASE 65* 28   CBC: Recent Labs  Lab 01/21/18 1307 01/22/18 0354 01/23/18 0409  WBC 8.2 21.3* 12.1*  HGB 17.3* 15.8 14.7  HCT 49.7 46.8 42.0  MCV 89.4 90.2 88.4  PLT 71* 75* 58*   Urinalysis    Component Value Date/Time    COLORURINE AMBER (A) 01/21/2018 1508   APPEARANCEUR CLEAR 01/21/2018 1508   LABSPEC >1.046 (H) 01/21/2018 1508   PHURINE 5.0 01/21/2018 1508   GLUCOSEU NEGATIVE 01/21/2018 1508   HGBUR NEGATIVE 01/21/2018 1508   BILIRUBINUR SMALL (A) 01/21/2018 1508   KETONESUR NEGATIVE 01/21/2018 1508   PROTEINUR NEGATIVE 01/21/2018 1508   UROBILINOGEN 1.0 02/18/2015 1313   NITRITE NEGATIVE 01/21/2018 1508   LEUKOCYTESUR NEGATIVE 01/21/2018 1508    Microbiology Recent Results (from the past 240 hour(s))  Blood culture (routine x 2)     Status: None (Preliminary result)   Collection Time: 01/21/18  4:28 PM  Result Value Ref Range Status   Specimen Description BLOOD RIGHT ANTECUBITAL  Final   Special Requests   Final    BOTTLES DRAWN AEROBIC AND ANAEROBIC Blood Culture adequate volume   Culture   Final    NO GROWTH < 24 HOURS Performed at [facility], [place] [address] [place], [place] [address]    Report Status PENDING  Incomplete  Blood culture (routine x 2)     Status: None (Preliminary result)   Collection Time: 01/21/18  4:40 PM  Result Value Ref Range Status   Specimen Description BLOOD RIGHT HAND  Final   Special Requests IN PEDIATRIC BOTTLE Blood Culture adequate volume  Final   Culture   Final    NO GROWTH < 24 HOURS Performed at [facility], [place] [address] [place], [place] [address]    Report Status PENDING  Incomplete    Time coordinating discharge: Approximately 40 minutes  Vance Gather, Blake  [facility] 01/23/2018, 11:59 AM Pager [phone]

## 2018-01-23 NOTE — Progress Notes (Signed)
   Patient Name: Benjamin Blake Date of Encounter: 01/23/2018, 10:51 AM    Subjective  Feels well Uneventful night   Objective  BP 116/64 (BP Location: Right Arm)   Pulse 66   Temp 98.1 F (36.7 C) (Oral)   Resp 17   Ht 5\' 8"  (1.727 m)   Wt 178 lb (80.7 kg)   SpO2 98%   BMI 27.06 kg/m  NAD  Hgb stable LFTs improving  Lipase ok    Assessment and Plan  Choledocholithiasis - Tx Cholelithiasis Metastatic colon cancer   Start low fat diet Home today No Abx Call oncologist tomorrow and ask when to restart chemo Labs (LFT) by me in 8 d (2/25) - ordered Avoid NSAID's ASA other than 81 mg ASA acetaminophen fine  Gatha Mayer, MD, [facility] [phone] (pager) 01/23/2018 10:51 AM

## 2018-01-24 ENCOUNTER — Encounter (HOSPITAL_COMMUNITY): Payer: Self-pay | Admitting: Internal Medicine

## 2018-01-26 LAB — CULTURE, BLOOD (ROUTINE X 2)
Culture: NO GROWTH
Culture: NO GROWTH
Special Requests: ADEQUATE
Special Requests: ADEQUATE

## 2018-01-31 ENCOUNTER — Other Ambulatory Visit (INDEPENDENT_AMBULATORY_CARE_PROVIDER_SITE_OTHER): Payer: Self-pay

## 2018-01-31 DIAGNOSIS — K8050 Calculus of bile duct without cholangitis or cholecystitis without obstruction: Secondary | ICD-10-CM

## 2018-01-31 LAB — HEPATIC FUNCTION PANEL
ALBUMIN: 3.3 g/dL — AB (ref 3.5–5.2)
ALT: 55 U/L — AB (ref 0–53)
AST: 50 U/L — AB (ref 0–37)
Alkaline Phosphatase: 252 U/L — ABNORMAL HIGH (ref 39–117)
Bilirubin, Direct: 0.7 mg/dL — ABNORMAL HIGH (ref 0.0–0.3)
TOTAL PROTEIN: 6.7 g/dL (ref 6.0–8.3)
Total Bilirubin: 1.7 mg/dL — ABNORMAL HIGH (ref 0.2–1.2)

## 2018-01-31 NOTE — Progress Notes (Signed)
Bilirubin almost NL Other LFT's abnormal as in past prior toCBD stone - he has hepatic metastases  My Chart message to patient  Keep LFT f/u with oncologist at [facility] and nothing further here unless having pain, jaundice

## 2018-02-02 DIAGNOSIS — K8050 Calculus of bile duct without cholangitis or cholecystitis without obstruction: Secondary | ICD-10-CM | POA: Diagnosis not present

## 2018-02-07 DIAGNOSIS — H6983 Other specified disorders of Eustachian tube, bilateral: Secondary | ICD-10-CM | POA: Diagnosis not present

## 2018-02-07 DIAGNOSIS — R040 Epistaxis: Secondary | ICD-10-CM | POA: Diagnosis not present

## 2018-02-10 DIAGNOSIS — C189 Malignant neoplasm of colon, unspecified: Secondary | ICD-10-CM | POA: Diagnosis not present

## 2018-02-10 DIAGNOSIS — K8050 Calculus of bile duct without cholangitis or cholecystitis without obstruction: Secondary | ICD-10-CM | POA: Diagnosis not present

## 2018-02-10 DIAGNOSIS — C182 Malignant neoplasm of ascending colon: Secondary | ICD-10-CM | POA: Diagnosis not present

## 2018-02-10 DIAGNOSIS — R51 Headache: Secondary | ICD-10-CM | POA: Diagnosis not present

## 2018-02-10 DIAGNOSIS — G629 Polyneuropathy, unspecified: Secondary | ICD-10-CM | POA: Diagnosis not present

## 2018-02-10 DIAGNOSIS — C787 Secondary malignant neoplasm of liver and intrahepatic bile duct: Secondary | ICD-10-CM | POA: Diagnosis not present

## 2018-02-10 DIAGNOSIS — C7800 Secondary malignant neoplasm of unspecified lung: Secondary | ICD-10-CM | POA: Diagnosis not present

## 2018-02-10 DIAGNOSIS — Z5111 Encounter for antineoplastic chemotherapy: Secondary | ICD-10-CM | POA: Diagnosis not present

## 2018-02-10 DIAGNOSIS — Z9049 Acquired absence of other specified parts of digestive tract: Secondary | ICD-10-CM | POA: Diagnosis not present

## 2018-02-10 DIAGNOSIS — C772 Secondary and unspecified malignant neoplasm of intra-abdominal lymph nodes: Secondary | ICD-10-CM | POA: Diagnosis not present

## 2018-02-10 DIAGNOSIS — C786 Secondary malignant neoplasm of retroperitoneum and peritoneum: Secondary | ICD-10-CM | POA: Diagnosis not present

## 2018-03-17 DIAGNOSIS — R911 Solitary pulmonary nodule: Secondary | ICD-10-CM | POA: Diagnosis not present

## 2018-03-17 DIAGNOSIS — C7802 Secondary malignant neoplasm of left lung: Secondary | ICD-10-CM | POA: Diagnosis not present

## 2018-03-17 DIAGNOSIS — C189 Malignant neoplasm of colon, unspecified: Secondary | ICD-10-CM | POA: Diagnosis not present

## 2018-03-17 DIAGNOSIS — R5383 Other fatigue: Secondary | ICD-10-CM | POA: Diagnosis not present

## 2018-03-17 DIAGNOSIS — C7800 Secondary malignant neoplasm of unspecified lung: Secondary | ICD-10-CM | POA: Diagnosis not present

## 2018-03-17 DIAGNOSIS — Z9049 Acquired absence of other specified parts of digestive tract: Secondary | ICD-10-CM | POA: Diagnosis not present

## 2018-03-17 DIAGNOSIS — C787 Secondary malignant neoplasm of liver and intrahepatic bile duct: Secondary | ICD-10-CM | POA: Diagnosis not present

## 2018-03-17 DIAGNOSIS — Z5111 Encounter for antineoplastic chemotherapy: Secondary | ICD-10-CM | POA: Diagnosis not present

## 2018-03-17 DIAGNOSIS — C772 Secondary and unspecified malignant neoplasm of intra-abdominal lymph nodes: Secondary | ICD-10-CM | POA: Diagnosis not present

## 2018-03-17 DIAGNOSIS — C7801 Secondary malignant neoplasm of right lung: Secondary | ICD-10-CM | POA: Diagnosis not present

## 2018-03-17 DIAGNOSIS — R161 Splenomegaly, not elsewhere classified: Secondary | ICD-10-CM | POA: Diagnosis not present

## 2018-03-17 DIAGNOSIS — R51 Headache: Secondary | ICD-10-CM | POA: Diagnosis not present

## 2018-03-30 DIAGNOSIS — C182 Malignant neoplasm of ascending colon: Secondary | ICD-10-CM | POA: Diagnosis not present

## 2018-03-31 DIAGNOSIS — F419 Anxiety disorder, unspecified: Secondary | ICD-10-CM | POA: Diagnosis not present

## 2018-03-31 DIAGNOSIS — C182 Malignant neoplasm of ascending colon: Secondary | ICD-10-CM | POA: Diagnosis not present

## 2018-03-31 DIAGNOSIS — C189 Malignant neoplasm of colon, unspecified: Secondary | ICD-10-CM | POA: Diagnosis not present

## 2018-03-31 DIAGNOSIS — C787 Secondary malignant neoplasm of liver and intrahepatic bile duct: Secondary | ICD-10-CM | POA: Diagnosis not present

## 2018-03-31 DIAGNOSIS — Z79899 Other long term (current) drug therapy: Secondary | ICD-10-CM | POA: Diagnosis not present

## 2018-03-31 DIAGNOSIS — C7802 Secondary malignant neoplasm of left lung: Secondary | ICD-10-CM | POA: Diagnosis not present

## 2018-03-31 DIAGNOSIS — Z9049 Acquired absence of other specified parts of digestive tract: Secondary | ICD-10-CM | POA: Diagnosis not present

## 2018-03-31 DIAGNOSIS — D696 Thrombocytopenia, unspecified: Secondary | ICD-10-CM | POA: Diagnosis not present

## 2018-03-31 DIAGNOSIS — C786 Secondary malignant neoplasm of retroperitoneum and peritoneum: Secondary | ICD-10-CM | POA: Diagnosis not present

## 2018-05-04 DIAGNOSIS — Z9049 Acquired absence of other specified parts of digestive tract: Secondary | ICD-10-CM | POA: Diagnosis not present

## 2018-05-04 DIAGNOSIS — C182 Malignant neoplasm of ascending colon: Secondary | ICD-10-CM | POA: Diagnosis not present

## 2018-05-04 DIAGNOSIS — C7800 Secondary malignant neoplasm of unspecified lung: Secondary | ICD-10-CM | POA: Diagnosis not present

## 2018-05-16 DIAGNOSIS — C182 Malignant neoplasm of ascending colon: Secondary | ICD-10-CM | POA: Diagnosis not present

## 2018-07-10 DIAGNOSIS — C7802 Secondary malignant neoplasm of left lung: Secondary | ICD-10-CM | POA: Diagnosis not present

## 2018-07-10 DIAGNOSIS — C7800 Secondary malignant neoplasm of unspecified lung: Secondary | ICD-10-CM | POA: Diagnosis not present

## 2018-07-10 DIAGNOSIS — C787 Secondary malignant neoplasm of liver and intrahepatic bile duct: Secondary | ICD-10-CM | POA: Diagnosis not present

## 2018-07-10 DIAGNOSIS — C182 Malignant neoplasm of ascending colon: Secondary | ICD-10-CM | POA: Diagnosis not present

## 2018-07-11 DIAGNOSIS — C182 Malignant neoplasm of ascending colon: Secondary | ICD-10-CM | POA: Diagnosis not present

## 2018-08-24 DIAGNOSIS — E782 Mixed hyperlipidemia: Secondary | ICD-10-CM | POA: Diagnosis not present

## 2018-08-24 DIAGNOSIS — Z0000 Encounter for general adult medical examination without abnormal findings: Secondary | ICD-10-CM | POA: Diagnosis not present

## 2018-08-29 DIAGNOSIS — C787 Secondary malignant neoplasm of liver and intrahepatic bile duct: Secondary | ICD-10-CM | POA: Diagnosis not present

## 2018-08-29 DIAGNOSIS — C182 Malignant neoplasm of ascending colon: Secondary | ICD-10-CM | POA: Diagnosis not present

## 2018-08-29 DIAGNOSIS — C7800 Secondary malignant neoplasm of unspecified lung: Secondary | ICD-10-CM | POA: Diagnosis not present

## 2018-09-30 DIAGNOSIS — Z23 Encounter for immunization: Secondary | ICD-10-CM | POA: Diagnosis not present

## 2018-10-19 DIAGNOSIS — C182 Malignant neoplasm of ascending colon: Secondary | ICD-10-CM | POA: Diagnosis not present

## 2018-10-31 DIAGNOSIS — C182 Malignant neoplasm of ascending colon: Secondary | ICD-10-CM | POA: Diagnosis not present

## 2018-11-16 DIAGNOSIS — C182 Malignant neoplasm of ascending colon: Secondary | ICD-10-CM | POA: Diagnosis not present

## 2018-11-22 ENCOUNTER — Encounter: Payer: Self-pay | Admitting: Internal Medicine

## 2018-11-22 ENCOUNTER — Ambulatory Visit (INDEPENDENT_AMBULATORY_CARE_PROVIDER_SITE_OTHER): Payer: Medicare Other | Admitting: Internal Medicine

## 2018-11-22 VITALS — BP 120/70 | HR 88 | Ht 66.5 in | Wt 164.1 lb

## 2018-11-22 DIAGNOSIS — K8020 Calculus of gallbladder without cholecystitis without obstruction: Secondary | ICD-10-CM

## 2018-11-22 DIAGNOSIS — K838 Other specified diseases of biliary tract: Secondary | ICD-10-CM

## 2018-11-22 DIAGNOSIS — R1010 Upper abdominal pain, unspecified: Secondary | ICD-10-CM

## 2018-11-22 DIAGNOSIS — C787 Secondary malignant neoplasm of liver and intrahepatic bile duct: Secondary | ICD-10-CM

## 2018-11-22 DIAGNOSIS — C189 Malignant neoplasm of colon, unspecified: Secondary | ICD-10-CM | POA: Diagnosis not present

## 2018-11-22 MED ORDER — PANTOPRAZOLE SODIUM 40 MG PO TBEC: 40.0000 mg | DELAYED_RELEASE_TABLET | Freq: Every day | ORAL | 0 refills | Status: AC

## 2018-11-22 NOTE — Patient Instructions (Signed)
  We will get your records from Dr Lavonda Jumbo for review.  You have been scheduled for an MRCP at [facility] on 12/03/18. Your appointment time is 5:10pm. Please arrive 25 minutes prior to your appointment time for registration purposes. Please make certain not to have anything to eat or drink 6 hours prior to your test. In addition, if you have any metal in your body, have a pacemaker or defibrillator, please be sure to let your ordering physician know. This test typically takes 45 minutes to 1 hour to complete. Should you need to reschedule, please call [phone] to do so.  We have sent the following medications to your pharmacy for you to pick up at your convenience: pantoprazole   I appreciate the opportunity to care for you. Silvano Rusk, MD, [facility]

## 2018-11-22 NOTE — Progress Notes (Signed)
Benjamin Blake 67 y.o. 1951-10-02 [id number]  Assessment & Plan:   Encounter Diagnoses  Name Primary?  . Dilated bile duct Yes  . Metastatic colon cancer to liver ([place])   . Pain of upper abdomen   . Gallstones    Cause of his problems is not clear.  His liver metastases are growing no is always a possibility that that is the cause.  Medication side effects with this immunotherapy is also possible.  He is convinced the symptoms remind him of when he had symptomatic choledocholithiasis that I treated in February of this year.  CT scans make no mention of bile duct dilation.  He did have that in February however and I suspect some of that was related to his metastases but could have all been due to the bile duct stone as well.  I wonder if the stone in the neck of the gallbladder is impacted and causing some problems.  Unfortunately 1 still has to think that the growth in his liver lesions may be driving this but does not seem to be that much so I am not convinced of that.  Given the overall situation I think additional imaging makes sense, I do not think an ultrasound would be accurate enough.  Stomach looks normal on CT scan, I am not inclined to pursue an EGD right now.  Evaluate with MRCP Empiric treatment with pantoprazole 40 mg daily I will review records from Dr. Kathrene Alu.  I can see the CT scans but the other information is not in care everywhere.  He has been having abs biweekly so I do not think it makes any sense to order any labs today.  Further pending these results  I appreciate the opportunity to care for this patient.  CC: [place], [place], [place] Dr. Lavonda Jumbo, [facility] [place] Dr. Clelia Croft [facility] Subjective:   Chief Complaint: Abdominal pain  HPI The patient is here because of abdominal pain that is occurring after eating for the past month or so associated with some low back pain.  I met him in February of this year when he had  symptomatic choledocholithiasis and he had an ERCP with sphincterotomy and stone extraction and he felt better.  At that time he had severe upper abdominal pain 3 hours after breakfast.  Vomited and improved.  MRCP showed intra-and extrahepatic biliary ductal dilation with 2 tiny stones in the distal common duct and an additional tiny stone in the neck of the gallbladder.  It showed his metastatic disease in the lung and the liver and renal cysts.  2 small pigmented and irregular stones removed from the bile duct and he improved.  In the interim he stopped taking chemotherapy and moved to immunotherapy with Dr. Kathrene Alu in [place] at the direction and recommendation of Dr. Reynaldo Minium his [facility] oncologist.  His liver lesions and lung lesion have been growing.  I can see CT reports that show growth in his lung lesion in the superior segment left lower lobe of the lung from 48 x 45 mm to 59 x 55 mm.  His left hepatic lobe metastasis changed from 34 x 30 to 39 x 34 mm and right posterior hepatic lobe metastasis grew from 28 mm to 35 mm.  He has been medicating with anti-inflammatories and acetaminophen when he gets his infusions with the immune therapy.  Has been taking some Tums at night and some Tylenol to try to help with his pains with limited success.  He does  have postprandial pain that the discomfort apparently not as severe as his pain he had in February with the bile duct stones but it is reminiscent.  Is not able to eat as much and has lost some weight.  Urine is not dark, i.e. no signs of jaundice.   Wt Readings from Last 3 Encounters:  11/22/18 164 lb 2 oz (74.4 kg)  01/21/18 178 lb (80.7 kg)  07/04/17 175 lb (79.4 kg)     No Known Allergies Current Meds  Medication Sig  . AMBULATORY NON FORMULARY MEDICATION CLINICAL TRIAL since June 2019 immunotheraphy drug infusion  . calcium elemental as carbonate (TUMS ULTRA 1000) 400 MG chewable tablet Chew 400 mg by mouth as needed.  . Cetirizine  HCl (ZYRTEC ALLERGY) 10 MG CAPS Take 10 mg by mouth daily.  Marland Kitchen loperamide (IMODIUM) 2 MG capsule Take 2 mg by mouth as needed.  . Multiple Vitamin (MULTIVITAMIN) capsule Take 1 capsule by mouth daily.  . ondansetron (ZOFRAN) 8 MG tablet Take 1 tablet (8 mg total) by mouth every 8 (eight) hours as needed for nausea or vomiting.  . rosuvastatin (CRESTOR) 10 MG tablet Take 10 mg by mouth daily.  . sildenafil (VIAGRA) 25 MG tablet Take 25 mg by mouth daily as needed.  . vitamin B-12 (CYANOCOBALAMIN) 100 MCG tablet Take 100 mcg by mouth daily.   Past Medical History:  Diagnosis Date  . AKI (acute kidney injury) ([place]) 02/2015  . Colon cancer metastasized to liver [facility]) 2010   initial Dx and surgical resection 2010, recurrent with liver, lung, peritoneal mets 2015.  treated with chemo at [facility].    . Complication of anesthesia    verse agitation  . Coronary artery calcification seen on CAT scan 03/08/2015  . Dysphagia 02/2015   dysphagia 1 diet.  s/p FEES  . Gallbladder sludge 2016   Past Surgical History:  Procedure Laterality Date  . COLON SURGERY  2010   resection of colon cancer in [place].    Marland Kitchen ERCP [name] 01/22/2018   Procedure: ENDOSCOPIC RETROGRADE CHOLANGIOPANCREATOGRAPHY (ERCP);  Surgeon: Gatha Mayer, MD;  Location: [place];  Service: Endoscopy;  Laterality: [name];   Social History   Social History Narrative   Married and retired   From [employer] to [place] 2015 - family here and for medical care at [place]   family history includes Cancer - Colon in his mother; Heart disease in his father.   Review of Systems As per HPI  Objective:   Physical Exam BP 120/70 (BP Location: Left Arm, Patient Position: Sitting, Cuff Size: Normal)   Pulse 88   Ht 5' 6.5" (1.689 m) Comment: height measured without shoes  Wt 164 lb 2 oz (74.4 kg)   BMI 26.09 kg/m  No acute distress Anicteric Lungs clear The heart sounds are normal The abdomen is soft mild fullness over the liver in the right  upper quadrant and mildly tender in the epigastrium but no organomegaly or mass there is a small ventral hernia and surgical scars He has an appropriate and upbeat mood and affect

## 2018-11-28 DIAGNOSIS — C182 Malignant neoplasm of ascending colon: Secondary | ICD-10-CM | POA: Diagnosis not present

## 2018-12-03 ENCOUNTER — Ambulatory Visit
Admission: RE | Admit: 2018-12-03 | Discharge: 2018-12-03 | Disposition: A | Discharge status: Home / Self Care | Payer: BLUE CROSS/BLUE SHIELD | Source: Ambulatory Visit | Attending: Internal Medicine | Admitting: Internal Medicine

## 2018-12-03 DIAGNOSIS — R1010 Upper abdominal pain, unspecified: Secondary | ICD-10-CM

## 2018-12-03 DIAGNOSIS — K8020 Calculus of gallbladder without cholecystitis without obstruction: Secondary | ICD-10-CM

## 2018-12-03 DIAGNOSIS — K838 Other specified diseases of biliary tract: Secondary | ICD-10-CM

## 2018-12-03 DIAGNOSIS — C787 Secondary malignant neoplasm of liver and intrahepatic bile duct: Secondary | ICD-10-CM

## 2018-12-03 DIAGNOSIS — C189 Malignant neoplasm of colon, unspecified: Secondary | ICD-10-CM

## 2018-12-03 MED ORDER — GADOBENATE DIMEGLUMINE 529 MG/ML IV SOLN
15.0000 mL | Freq: Once | INTRAVENOUS | Status: AC | PRN
  Administered 2018-12-03: 15 mL via INTRAVENOUS

## 2018-12-05 NOTE — Progress Notes (Signed)
Known metastases are enlarging and increasing in number and new left adrenal metastasis My Chart message to patient and plan to call tomorrow

## 2018-12-06 NOTE — Progress Notes (Signed)
Message left that I called and had left My Chart message Will try to call again 1/2

## 2018-12-08 NOTE — Progress Notes (Signed)
I spoke to him and he has already seen oncologist in [place] and had a CT there for more f/u I do not think endoscopic or IR intervention for mildly dilated intrahepatic ducts in segs 2 and 3 is warranted at this time See me prn

## 2018-12-20 DIAGNOSIS — D122 Benign neoplasm of ascending colon: Secondary | ICD-10-CM | POA: Diagnosis not present

## 2018-12-20 DIAGNOSIS — I6381 Other cerebral infarction due to occlusion or stenosis of small artery: Secondary | ICD-10-CM | POA: Diagnosis not present

## 2018-12-20 DIAGNOSIS — Z5111 Encounter for antineoplastic chemotherapy: Secondary | ICD-10-CM | POA: Diagnosis not present

## 2018-12-20 DIAGNOSIS — J320 Chronic maxillary sinusitis: Secondary | ICD-10-CM | POA: Diagnosis not present

## 2018-12-20 DIAGNOSIS — C182 Malignant neoplasm of ascending colon: Secondary | ICD-10-CM | POA: Diagnosis not present

## 2018-12-27 DIAGNOSIS — C182 Malignant neoplasm of ascending colon: Secondary | ICD-10-CM | POA: Diagnosis not present

## 2019-01-17 DIAGNOSIS — C182 Malignant neoplasm of ascending colon: Secondary | ICD-10-CM | POA: Diagnosis not present

## 2019-02-06 DIAGNOSIS — C182 Malignant neoplasm of ascending colon: Secondary | ICD-10-CM | POA: Diagnosis not present

## 2019-02-07 DIAGNOSIS — C182 Malignant neoplasm of ascending colon: Secondary | ICD-10-CM | POA: Diagnosis not present

## 2019-02-21 DIAGNOSIS — C7800 Secondary malignant neoplasm of unspecified lung: Secondary | ICD-10-CM | POA: Diagnosis not present

## 2019-02-24 DIAGNOSIS — C7800 Secondary malignant neoplasm of unspecified lung: Secondary | ICD-10-CM | POA: Diagnosis not present

## 2019-02-27 DIAGNOSIS — C7800 Secondary malignant neoplasm of unspecified lung: Secondary | ICD-10-CM | POA: Diagnosis not present

## 2019-10-08 DEATH — deceased
